# Patient Record
Sex: Female | Born: 1950 | Race: White | Hispanic: No | Marital: Married | Smoking: Never smoker
Health system: Southern US, Community
[De-identification: ages and names within clinical notes are randomized; demographics above are authoritative.]

## PROBLEM LIST (undated history)

## (undated) DIAGNOSIS — I10 Essential (primary) hypertension: Secondary | ICD-10-CM

## (undated) DIAGNOSIS — F419 Anxiety disorder, unspecified: Secondary | ICD-10-CM

## (undated) DIAGNOSIS — M797 Fibromyalgia: Secondary | ICD-10-CM

## (undated) DIAGNOSIS — E039 Hypothyroidism, unspecified: Secondary | ICD-10-CM

## (undated) DIAGNOSIS — J40 Bronchitis, not specified as acute or chronic: Secondary | ICD-10-CM

## (undated) DIAGNOSIS — J302 Other seasonal allergic rhinitis: Secondary | ICD-10-CM

## (undated) DIAGNOSIS — Z973 Presence of spectacles and contact lenses: Secondary | ICD-10-CM

## (undated) DIAGNOSIS — E079 Disorder of thyroid, unspecified: Secondary | ICD-10-CM

## (undated) DIAGNOSIS — M4800 Spinal stenosis, site unspecified: Secondary | ICD-10-CM

## (undated) HISTORY — PX: FRACTURE SURGERY: SHX138

## (undated) HISTORY — PX: LAPAROSCOPIC GASTRIC BAND REMOVAL WITH LAPAROSCOPIC GASTRIC SLEEVE RESECTION: SHX6498

## (undated) HISTORY — PX: ANTERIOR FUSION CERVICAL SPINE: SUR626

## (undated) HISTORY — PX: CHOLECYSTECTOMY: SHX55

---

## 2007-10-10 ENCOUNTER — Encounter: Payer: Self-pay | Admitting: Endocrinology

## 2007-10-10 ENCOUNTER — Ambulatory Visit: Payer: Self-pay | Admitting: Endocrinology

## 2007-10-10 DIAGNOSIS — F411 Generalized anxiety disorder: Secondary | ICD-10-CM | POA: Insufficient documentation

## 2007-10-10 DIAGNOSIS — J309 Allergic rhinitis, unspecified: Secondary | ICD-10-CM | POA: Insufficient documentation

## 2007-10-10 DIAGNOSIS — M1990 Unspecified osteoarthritis, unspecified site: Secondary | ICD-10-CM | POA: Insufficient documentation

## 2007-10-10 DIAGNOSIS — I10 Essential (primary) hypertension: Secondary | ICD-10-CM | POA: Insufficient documentation

## 2007-10-10 DIAGNOSIS — K219 Gastro-esophageal reflux disease without esophagitis: Secondary | ICD-10-CM | POA: Insufficient documentation

## 2016-04-10 ENCOUNTER — Ambulatory Visit: Payer: Self-pay | Admitting: Physician Assistant

## 2016-04-17 ENCOUNTER — Encounter (HOSPITAL_COMMUNITY)
Admission: RE | Admit: 2016-04-17 | Discharge: 2016-04-17 | Disposition: A | Discharge status: Home / Self Care | Payer: BC Managed Care – PPO | Source: Ambulatory Visit | Attending: Orthopedic Surgery | Admitting: Orthopedic Surgery

## 2016-04-17 ENCOUNTER — Encounter (HOSPITAL_COMMUNITY): Payer: Self-pay

## 2016-04-17 DIAGNOSIS — Z6834 Body mass index (BMI) 34.0-34.9, adult: Secondary | ICD-10-CM | POA: Diagnosis not present

## 2016-04-17 DIAGNOSIS — Z79899 Other long term (current) drug therapy: Secondary | ICD-10-CM | POA: Diagnosis not present

## 2016-04-17 DIAGNOSIS — M797 Fibromyalgia: Secondary | ICD-10-CM | POA: Diagnosis not present

## 2016-04-17 DIAGNOSIS — M4806 Spinal stenosis, lumbar region: Secondary | ICD-10-CM | POA: Diagnosis not present

## 2016-04-17 DIAGNOSIS — M5117 Intervertebral disc disorders with radiculopathy, lumbosacral region: Secondary | ICD-10-CM | POA: Diagnosis not present

## 2016-04-17 DIAGNOSIS — M549 Dorsalgia, unspecified: Secondary | ICD-10-CM | POA: Diagnosis present

## 2016-04-17 DIAGNOSIS — Z79891 Long term (current) use of opiate analgesic: Secondary | ICD-10-CM | POA: Diagnosis not present

## 2016-04-17 DIAGNOSIS — E669 Obesity, unspecified: Secondary | ICD-10-CM | POA: Diagnosis not present

## 2016-04-17 DIAGNOSIS — I10 Essential (primary) hypertension: Secondary | ICD-10-CM | POA: Diagnosis not present

## 2016-04-17 DIAGNOSIS — Z01812 Encounter for preprocedural laboratory examination: Secondary | ICD-10-CM | POA: Diagnosis not present

## 2016-04-17 DIAGNOSIS — Z01810 Encounter for preprocedural cardiovascular examination: Secondary | ICD-10-CM | POA: Diagnosis not present

## 2016-04-17 DIAGNOSIS — F419 Anxiety disorder, unspecified: Secondary | ICD-10-CM | POA: Diagnosis not present

## 2016-04-17 DIAGNOSIS — K219 Gastro-esophageal reflux disease without esophagitis: Secondary | ICD-10-CM | POA: Diagnosis not present

## 2016-04-17 DIAGNOSIS — E039 Hypothyroidism, unspecified: Secondary | ICD-10-CM | POA: Diagnosis not present

## 2016-04-17 HISTORY — DX: Bronchitis, not specified as acute or chronic: J40

## 2016-04-17 HISTORY — DX: Anxiety disorder, unspecified: F41.9

## 2016-04-17 HISTORY — DX: Disorder of thyroid, unspecified: E07.9

## 2016-04-17 HISTORY — DX: Presence of spectacles and contact lenses: Z97.3

## 2016-04-17 HISTORY — DX: Other seasonal allergic rhinitis: J30.2

## 2016-04-17 HISTORY — DX: Essential (primary) hypertension: I10

## 2016-04-17 HISTORY — DX: Fibromyalgia: M79.7

## 2016-04-17 HISTORY — DX: Hypothyroidism, unspecified: E03.9

## 2016-04-17 HISTORY — DX: Spinal stenosis, site unspecified: M48.00

## 2016-04-17 LAB — BASIC METABOLIC PANEL
ANION GAP: 7 (ref 5–15)
BUN: 11 mg/dL (ref 6–20)
CALCIUM: 9 mg/dL (ref 8.9–10.3)
CHLORIDE: 106 mmol/L (ref 101–111)
CO2: 27 mmol/L (ref 22–32)
CREATININE: 0.89 mg/dL (ref 0.44–1.00)
GFR calc non Af Amer: >60 mL/min (ref >60)
Glucose, Bld: 93 mg/dL (ref 65–99)
Potassium: 4 mmol/L (ref 3.5–5.1)
SODIUM: 140 mmol/L (ref 135–145)

## 2016-04-17 LAB — CBC
HCT: 38.4 % (ref 36.0–46.0)
HEMOGLOBIN: 12.6 g/dL (ref 12.0–15.0)
MCH: 29.4 pg (ref 26.0–34.0)
MCHC: 32.8 g/dL (ref 30.0–36.0)
MCV: 89.7 fL (ref 78.0–100.0)
PLATELETS: 321 10*3/uL (ref 150–400)
RBC: 4.28 MIL/uL (ref 3.87–5.11)
RDW: 13.9 % (ref 11.5–15.5)
WBC: 9.5 10*3/uL (ref 4.0–10.5)

## 2016-04-17 LAB — SURGICAL PCR SCREEN
MRSA, PCR: NEGATIVE
Staphylococcus aureus: NEGATIVE

## 2016-04-17 NOTE — Progress Notes (Addendum)
Anesthesia Chart Review: Patient is a 65 year old female scheduled for L5-S1 decompression discectomy on 04/19/16 by Dr. Rolena Infante.  History includes non-smoker, hypothyroidism, HTN (2010 in the setting of stress following her mother's death; currently no meds), fibromyalgia, anxiety, ACDF, cholecystectomy, laparoscopic gastric band removal with laparoscopic gastric sleeve resection. BMI is 34 consistent with obesity. PCP is Laverna Peace, NP.   Meds include Symbicort, Celexa, levothyroxine, Singulair, MSIR, Zanaflex, tramadol.  PAT Vitals: BP 119/75, HR 91, RR 20, T 36.9C, O2 sat 97%.  EKG 04/17/16: NSR. I think she does have a non-specific ST/T wave abnormality in inferior leads III and aVF. This is new/more pronounced when compared to 17-Feb-2013/ tracing from [facility]. She denied chest pain, SOB, and being under the care of a cardiologist. She denied prior stress, echo, or cath.   Preoperative labs noted.   She is asymptomatic from a CV standpoint. If no acute changes then I would anticipate that she could proceed as planned.  George Hugh [facility] Short Stay Center/Anesthesiology Phone [phone] 04/18/2016 9:57 AM

## 2016-04-17 NOTE — Progress Notes (Signed)
Pt denies SOB, chest pain, and being under the care of a cardiologist. Pt denies having a stress test, echo and cardiac cath. Pt denies having a chest x ray and EKG within the last year. Spoke with Ebony Hail, [place], Anesthesia, regarding pt past medical history of HTN in 2010 and Ebony Hail advised that I complete an EKG, CBC and BMP. Pt chart forwarded to anesthesia for pre-op consult as ordered.

## 2016-04-17 NOTE — Pre-Procedure Instructions (Signed)
Kristie [name]  04/17/2016      [facility] [address], [place] [address] [place] [address] Phone: [phone] Fax: [phone]    Your procedure is scheduled on Wednesday, Apr 19, 2016  Report to [facility] Admitting at 10:30 A.M.  Call this number if you have problems the morning of surgery:  [phone]   Remember:  Do not eat food or drink liquids after midnight Tuesday, Apr 18, 2016  Take these medicines the morning of surgery with A SIP OF WATER :citalopram (CELEXA),  levothyroxine (SYNTHROID, LEVOTHROID), montelukast (SINGULAIR), if needed: morphine (MSIR) OR  traMADol (ULTRAM) for pain, budesonide-formoterol (SYMBICORT) inhaler for wheezing ( Bring inhaler in with you on day of surgery). Stop taking Aspirin, vitamins, fish oil and herbal medications. Do not take any NSAIDs ie: Ibuprofen, Advil, Naproxen, BC and Goody Powder or any medication containing Aspirin; stop now.  Do not wear jewelry, make-up or nail polish.  Do not wear lotions, powders, or perfumes.  You may not  wear deodorant.  Do not shave 48 hours prior to surgery.    Do not bring valuables to the hospital.  [facility] is not responsible for any belongings or valuables.  Contacts, dentures or bridgework may not be worn into surgery.  Leave your suitcase in the car.  After surgery it may be brought to your room.  For patients admitted to the hospital, discharge time will be determined by your treatment team.  Patients discharged the day of surgery will not be allowed to drive home.   Name and phone number of your driver:  Special instructions: [place] - Preparing for Surgery  Before surgery, you can play an important role.  Because skin is not sterile, your skin needs to be as free of germs as possible.  You can reduce the number of germs on you skin by washing with CHG (chlorahexidine gluconate) soap before surgery.  CHG is an antiseptic cleaner which  kills germs and bonds with the skin to continue killing germs even after washing.  Please DO NOT use if you have an allergy to CHG or antibacterial soaps.  If your skin becomes reddened/irritated stop using the CHG and inform your nurse when you arrive at Short Stay.  Do not shave (including legs and underarms) for at least 48 hours prior to the first CHG shower.  You may shave your face.  Please follow these instructions carefully:   1.  Shower with CHG Soap the night before surgery and the morning of Surgery.  2.  If you choose to wash your hair, wash your hair first as usual with your normal shampoo.  3.  After you shampoo, rinse your hair and body thoroughly to remove the Shampoo.  4.  Use CHG as you would any other liquid soap.  You can apply chg directly  to the skin and wash gently with scrungie or a clean washcloth.  5.  Apply the CHG Soap to your body ONLY FROM THE NECK DOWN.  Do not use on open wounds or open sores.  Avoid contact with your eyes, ears, mouth and genitals (private parts).  Wash genitals (private parts) with your normal soap.  6.  Wash thoroughly, paying special attention to the area where your surgery will be performed.  7.  Thoroughly rinse your body with warm water from the neck down.  8.  DO NOT shower/wash with your normal soap after using and  rinsing off the CHG Soap.  9.  Pat yourself dry with a clean towel.            10.  Wear clean pajamas.            11.  Place clean sheets on your bed the night of your first shower and do not sleep with pets.  Day of Surgery  Do not apply any lotions/deodorants the morning of surgery.  Please wear clean clothes to the hospital/surgery center.  Please read over the following fact sheets that you were given. Pain Booklet, Coughing and Deep Breathing, MRSA Information and Surgical Site Infection Prevention

## 2016-04-19 ENCOUNTER — Encounter (HOSPITAL_COMMUNITY): Payer: Self-pay | Admitting: *Deleted

## 2016-04-19 ENCOUNTER — Ambulatory Visit (HOSPITAL_COMMUNITY): Payer: BC Managed Care – PPO | Admitting: Vascular Surgery

## 2016-04-19 ENCOUNTER — Observation Stay (HOSPITAL_COMMUNITY)
Admission: AD | Admit: 2016-04-19 | Discharge: 2016-04-20 | DRG: 520 | Disposition: A | Discharge status: Home Health | Payer: BC Managed Care – PPO | Source: Ambulatory Visit | Attending: Orthopedic Surgery | Admitting: Orthopedic Surgery

## 2016-04-19 ENCOUNTER — Ambulatory Visit (HOSPITAL_COMMUNITY): Payer: BC Managed Care – PPO

## 2016-04-19 ENCOUNTER — Encounter (HOSPITAL_COMMUNITY)
Admission: AD | Disposition: A | Discharge status: Home Health | Payer: Self-pay | Source: Ambulatory Visit | Attending: Orthopedic Surgery

## 2016-04-19 ENCOUNTER — Ambulatory Visit (HOSPITAL_COMMUNITY): Payer: BC Managed Care – PPO | Admitting: Anesthesiology

## 2016-04-19 DIAGNOSIS — E669 Obesity, unspecified: Secondary | ICD-10-CM | POA: Insufficient documentation

## 2016-04-19 DIAGNOSIS — E039 Hypothyroidism, unspecified: Secondary | ICD-10-CM | POA: Insufficient documentation

## 2016-04-19 DIAGNOSIS — Z01810 Encounter for preprocedural cardiovascular examination: Secondary | ICD-10-CM | POA: Insufficient documentation

## 2016-04-19 DIAGNOSIS — Z79899 Other long term (current) drug therapy: Secondary | ICD-10-CM | POA: Insufficient documentation

## 2016-04-19 DIAGNOSIS — I10 Essential (primary) hypertension: Secondary | ICD-10-CM | POA: Insufficient documentation

## 2016-04-19 DIAGNOSIS — M5117 Intervertebral disc disorders with radiculopathy, lumbosacral region: Secondary | ICD-10-CM | POA: Diagnosis not present

## 2016-04-19 DIAGNOSIS — Z01812 Encounter for preprocedural laboratory examination: Secondary | ICD-10-CM | POA: Insufficient documentation

## 2016-04-19 DIAGNOSIS — K219 Gastro-esophageal reflux disease without esophagitis: Secondary | ICD-10-CM | POA: Insufficient documentation

## 2016-04-19 DIAGNOSIS — F419 Anxiety disorder, unspecified: Secondary | ICD-10-CM | POA: Insufficient documentation

## 2016-04-19 DIAGNOSIS — M797 Fibromyalgia: Secondary | ICD-10-CM | POA: Insufficient documentation

## 2016-04-19 DIAGNOSIS — M4806 Spinal stenosis, lumbar region: Secondary | ICD-10-CM | POA: Insufficient documentation

## 2016-04-19 DIAGNOSIS — Z6834 Body mass index (BMI) 34.0-34.9, adult: Secondary | ICD-10-CM | POA: Insufficient documentation

## 2016-04-19 DIAGNOSIS — M549 Dorsalgia, unspecified: Secondary | ICD-10-CM | POA: Diagnosis present

## 2016-04-19 DIAGNOSIS — Z419 Encounter for procedure for purposes other than remedying health state, unspecified: Secondary | ICD-10-CM

## 2016-04-19 DIAGNOSIS — Z79891 Long term (current) use of opiate analgesic: Secondary | ICD-10-CM | POA: Insufficient documentation

## 2016-04-19 HISTORY — PX: LUMBAR LAMINECTOMY/DECOMPRESSION MICRODISCECTOMY: SHX5026

## 2016-04-19 IMAGING — CR DG LUMBAR SPINE 1V
1 series · 1 of 1 positions shown · non-contrast
Comparison: Prior film lateral view lumbar spine same day

CLINICAL DATA: L5-S1 decompression

EXAM:
LUMBAR SPINE - 1 VIEW

[lateral]
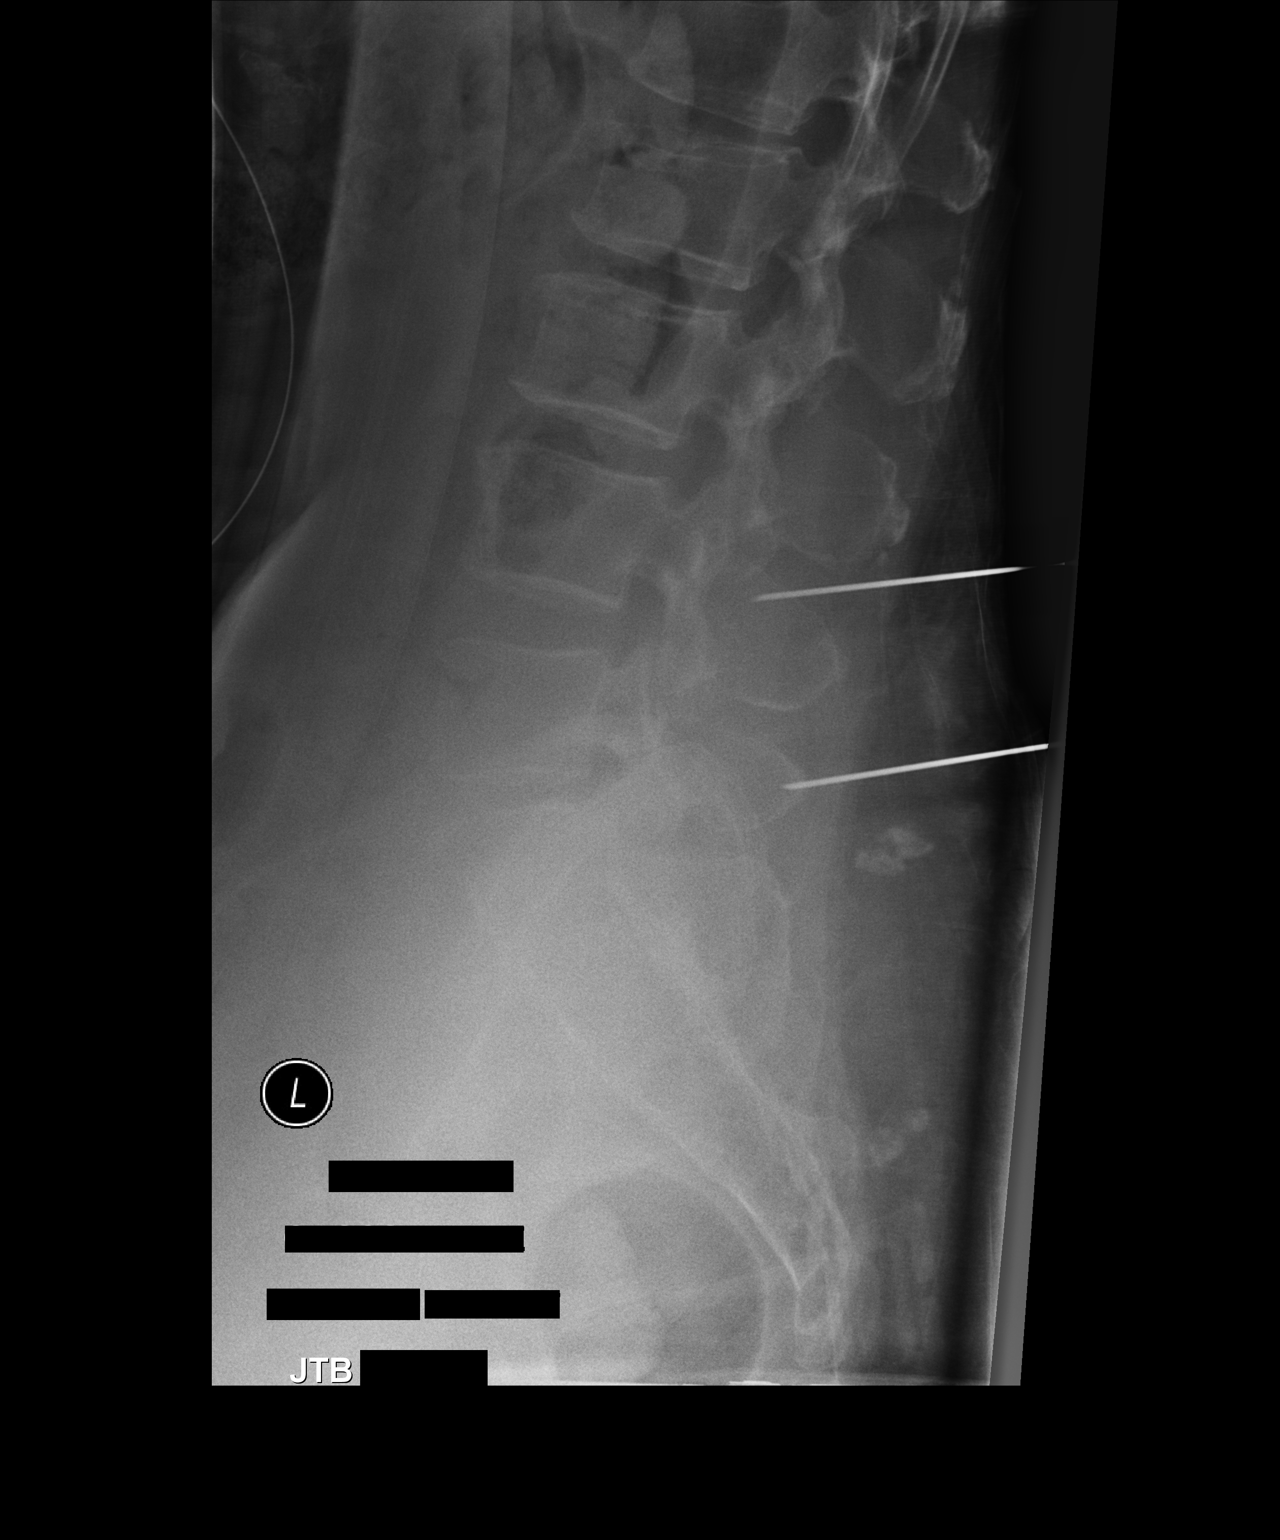

[1 of 1 positions shown; findings below may reference images not displayed]

FINDINGS: Single lateral view of the lumbar spine submitted. There is a
posterior localization needle at the level of L5-S1 disc space. The
tip of the needle is at the tip of L5 spinous process. A second
localization needle more superiorly at the level of lower endplate
of L4 mid aspect of spinous process of L4. There is disc space
flattening at L5-S1 level.
IMPRESSION: There is a posterior localization needle at the level of L5-S1
level. The tip of the needle is at the tip of L5 spinous process. A
second localization needle more superiorly at the level of lower
endplate of L4 mid aspect of spinous process of L4.

## 2016-04-19 SURGERY — LUMBAR LAMINECTOMY/DECOMPRESSION MICRODISCECTOMY
Anesthesia: General

## 2016-04-19 MED ORDER — METHOCARBAMOL 1000 MG/10ML IJ SOLN
500.0000 mg | Freq: Four times a day (QID) | INTRAVENOUS | Status: DC | PRN
  Filled 2016-04-19: qty 5

## 2016-04-19 MED ORDER — FENTANYL CITRATE (PF) 250 MCG/5ML IJ SOLN
INTRAMUSCULAR | Status: AC
  Filled 2016-04-19: qty 5

## 2016-04-19 MED ORDER — CEFAZOLIN SODIUM-DEXTROSE 2-4 GM/100ML-% IV SOLN
2.0000 g | INTRAVENOUS | Status: AC
  Administered 2016-04-19: 2 g via INTRAVENOUS
  Filled 2016-04-19: qty 100

## 2016-04-19 MED ORDER — 0.9 % SODIUM CHLORIDE (POUR BTL) OPTIME
TOPICAL | Status: DC | PRN
  Administered 2016-04-19: 1000 mL

## 2016-04-19 MED ORDER — TIZANIDINE HCL 4 MG PO CAPS
4.0000 mg | ORAL_CAPSULE | Freq: Every day | ORAL | Status: DC
  Administered 2016-04-19: 4 mg via ORAL
  Filled 2016-04-19: qty 1

## 2016-04-19 MED ORDER — LIDOCAINE HCL (CARDIAC) 20 MG/ML IV SOLN
INTRAVENOUS | Status: DC | PRN
  Administered 2016-04-19: 70 mg via INTRAVENOUS

## 2016-04-19 MED ORDER — ONDANSETRON HCL 4 MG/2ML IJ SOLN
INTRAMUSCULAR | Status: AC
  Filled 2016-04-19: qty 2

## 2016-04-19 MED ORDER — MEPERIDINE HCL 25 MG/ML IJ SOLN
6.2500 mg | INTRAMUSCULAR | Status: DC | PRN
Start: 2016-04-19 — End: 2016-04-19

## 2016-04-19 MED ORDER — MENTHOL 3 MG MT LOZG: 1.0000 | LOZENGE | OROMUCOSAL | Status: DC | PRN

## 2016-04-19 MED ORDER — HYDROMORPHONE HCL 1 MG/ML IJ SOLN
0.2500 mg | INTRAMUSCULAR | Status: DC | PRN
  Administered 2016-04-19 (×4): 0.5 mg via INTRAVENOUS

## 2016-04-19 MED ORDER — MIDAZOLAM HCL 2 MG/2ML IJ SOLN
INTRAMUSCULAR | Status: AC
  Filled 2016-04-19: qty 2

## 2016-04-19 MED ORDER — LEVOTHYROXINE SODIUM 125 MCG PO TABS
125.0000 ug | ORAL_TABLET | Freq: Every day | ORAL | Status: DC
  Administered 2016-04-20: 125 ug via ORAL
  Filled 2016-04-19: qty 1

## 2016-04-19 MED ORDER — PHENYLEPHRINE HCL 10 MG/ML IJ SOLN
10.0000 mg | INTRAVENOUS | Status: DC | PRN
  Administered 2016-04-19: 25 ug/min via INTRAVENOUS

## 2016-04-19 MED ORDER — SODIUM CHLORIDE 0.9% FLUSH: 3.0000 mL | Freq: Two times a day (BID) | INTRAVENOUS | Status: DC

## 2016-04-19 MED ORDER — PROPOFOL 10 MG/ML IV BOLUS
INTRAVENOUS | Status: DC | PRN
  Administered 2016-04-19: 150 mg via INTRAVENOUS
  Administered 2016-04-19: 40 mg via INTRAVENOUS

## 2016-04-19 MED ORDER — LACTATED RINGERS IV SOLN
INTRAVENOUS | Status: DC | PRN
  Administered 2016-04-19 (×2): via INTRAVENOUS

## 2016-04-19 MED ORDER — MIDAZOLAM HCL 5 MG/5ML IJ SOLN
INTRAMUSCULAR | Status: DC | PRN
  Administered 2016-04-19: 1 mg via INTRAVENOUS

## 2016-04-19 MED ORDER — PHENOL 1.4 % MT LIQD: 1.0000 | OROMUCOSAL | Status: DC | PRN

## 2016-04-19 MED ORDER — DEXAMETHASONE SODIUM PHOSPHATE 10 MG/ML IJ SOLN
INTRAMUSCULAR | Status: DC | PRN
  Administered 2016-04-19: 10 mg via INTRAVENOUS

## 2016-04-19 MED ORDER — PROPOFOL 10 MG/ML IV BOLUS
INTRAVENOUS | Status: AC
  Filled 2016-04-19: qty 20

## 2016-04-19 MED ORDER — MORPHINE SULFATE (PF) 2 MG/ML IV SOLN
1.0000 mg | INTRAVENOUS | Status: DC | PRN
  Administered 2016-04-20: 2 mg via INTRAVENOUS
  Filled 2016-04-19: qty 1

## 2016-04-19 MED ORDER — THROMBIN 20000 UNITS EX SOLR
CUTANEOUS | Status: DC | PRN
  Administered 2016-04-19: 20000 [IU] via TOPICAL

## 2016-04-19 MED ORDER — VECURONIUM BROMIDE 10 MG IV SOLR
INTRAVENOUS | Status: DC | PRN
Start: 2016-04-19 — End: 2016-04-19
  Administered 2016-04-19: 1 mg via INTRAVENOUS
  Administered 2016-04-19: 2 mg via INTRAVENOUS

## 2016-04-19 MED ORDER — LIDOCAINE 2% (20 MG/ML) 5 ML SYRINGE
INTRAMUSCULAR | Status: AC
  Filled 2016-04-19: qty 5

## 2016-04-19 MED ORDER — SODIUM CHLORIDE 0.9 % IV SOLN: 250.0000 mL | INTRAVENOUS | Status: DC

## 2016-04-19 MED ORDER — ROCURONIUM BROMIDE 100 MG/10ML IV SOLN
INTRAVENOUS | Status: DC | PRN
  Administered 2016-04-19: 10 mg via INTRAVENOUS
  Administered 2016-04-19: 40 mg via INTRAVENOUS

## 2016-04-19 MED ORDER — MONTELUKAST SODIUM 10 MG PO TABS
10.0000 mg | ORAL_TABLET | ORAL | Status: DC
  Administered 2016-04-20: 10 mg via ORAL
  Filled 2016-04-19: qty 1

## 2016-04-19 MED ORDER — HYDROMORPHONE HCL 1 MG/ML IJ SOLN
INTRAMUSCULAR | Status: AC
  Filled 2016-04-19: qty 1

## 2016-04-19 MED ORDER — LACTATED RINGERS IV SOLN: INTRAVENOUS | Status: DC

## 2016-04-19 MED ORDER — METHOCARBAMOL 500 MG PO TABS
500.0000 mg | ORAL_TABLET | Freq: Four times a day (QID) | ORAL | Status: DC | PRN
Start: 2016-04-19 — End: 2016-04-20
  Administered 2016-04-19 – 2016-04-20 (×2): 500 mg via ORAL
  Filled 2016-04-19 (×2): qty 1

## 2016-04-19 MED ORDER — MORPHINE SULFATE 15 MG PO TABS
15.0000 mg | ORAL_TABLET | Freq: Four times a day (QID) | ORAL | Status: DC | PRN
  Administered 2016-04-19 – 2016-04-20 (×2): 15 mg via ORAL
  Filled 2016-04-19 (×2): qty 1

## 2016-04-19 MED ORDER — CITALOPRAM HYDROBROMIDE 40 MG PO TABS
40.0000 mg | ORAL_TABLET | Freq: Every day | ORAL | Status: DC
  Administered 2016-04-20: 40 mg via ORAL
  Filled 2016-04-19: qty 1

## 2016-04-19 MED ORDER — SUGAMMADEX SODIUM 200 MG/2ML IV SOLN
INTRAVENOUS | Status: DC | PRN
  Administered 2016-04-19: 186.8 mg via INTRAVENOUS

## 2016-04-19 MED ORDER — SODIUM CHLORIDE 0.9% FLUSH: 3.0000 mL | INTRAVENOUS | Status: DC | PRN

## 2016-04-19 MED ORDER — FENTANYL CITRATE (PF) 100 MCG/2ML IJ SOLN
INTRAMUSCULAR | Status: DC | PRN
  Administered 2016-04-19: 100 ug via INTRAVENOUS
  Administered 2016-04-19 (×4): 50 ug via INTRAVENOUS
  Administered 2016-04-19: 100 ug via INTRAVENOUS
  Administered 2016-04-19: 50 ug via INTRAVENOUS

## 2016-04-19 MED ORDER — ONDANSETRON HCL 4 MG/2ML IJ SOLN
INTRAMUSCULAR | Status: DC | PRN
  Administered 2016-04-19: 4 mg via INTRAVENOUS

## 2016-04-19 MED ORDER — LACTATED RINGERS IV SOLN
Freq: Once | INTRAVENOUS | Status: AC
  Administered 2016-04-19: 11:00:00 via INTRAVENOUS

## 2016-04-19 MED ORDER — HYDROMORPHONE HCL 1 MG/ML IJ SOLN
INTRAMUSCULAR | Status: AC
Start: 2016-04-19 — End: 2016-04-20
  Filled 2016-04-19: qty 1

## 2016-04-19 MED ORDER — ONDANSETRON HCL 4 MG/2ML IJ SOLN
4.0000 mg | INTRAMUSCULAR | Status: DC | PRN
  Administered 2016-04-20: 4 mg via INTRAVENOUS
  Filled 2016-04-19: qty 2

## 2016-04-19 MED ORDER — BUPIVACAINE-EPINEPHRINE (PF) 0.25% -1:200000 IJ SOLN
INTRAMUSCULAR | Status: AC
  Filled 2016-04-19: qty 30

## 2016-04-19 MED ORDER — MOMETASONE FURO-FORMOTEROL FUM 200-5 MCG/ACT IN AERO
2.0000 | INHALATION_SPRAY | Freq: Two times a day (BID) | RESPIRATORY_TRACT | Status: DC
  Administered 2016-04-19 – 2016-04-20 (×2): 2 via RESPIRATORY_TRACT
  Filled 2016-04-19 (×2): qty 8.8

## 2016-04-19 MED ORDER — THROMBIN 20000 UNITS EX SOLR
CUTANEOUS | Status: AC
  Filled 2016-04-19: qty 20000

## 2016-04-19 MED ORDER — HEMOSTATIC AGENTS (NO CHARGE) OPTIME
TOPICAL | Status: DC | PRN
  Administered 2016-04-19 (×2): 1 via TOPICAL

## 2016-04-19 MED ORDER — ESMOLOL HCL 100 MG/10ML IV SOLN
INTRAVENOUS | Status: DC | PRN
  Administered 2016-04-19: 20 mg via INTRAVENOUS

## 2016-04-19 MED ORDER — CEFAZOLIN SODIUM 1-5 GM-% IV SOLN
1.0000 g | Freq: Three times a day (TID) | INTRAVENOUS | Status: AC
  Administered 2016-04-19 – 2016-04-20 (×2): 1 g via INTRAVENOUS
  Filled 2016-04-19 (×2): qty 50

## 2016-04-19 MED ORDER — BUPIVACAINE-EPINEPHRINE 0.25% -1:200000 IJ SOLN
INTRAMUSCULAR | Status: DC | PRN
  Administered 2016-04-19: 10 mL

## 2016-04-19 MED ORDER — PROCHLORPERAZINE EDISYLATE 5 MG/ML IJ SOLN: 10.0000 mg | INTRAMUSCULAR | Status: DC | PRN

## 2016-04-19 SURGICAL SUPPLY — 54 items
BNDG GAUZE ELAST 4 BULKY (GAUZE/BANDAGES/DRESSINGS) ×2 IMPLANT
CANISTER SUCTION 2500CC (MISCELLANEOUS) ×2 IMPLANT
CLSR STERI-STRIP ANTIMIC 1/2X4 (GAUZE/BANDAGES/DRESSINGS) ×2 IMPLANT
COVER SURGICAL LIGHT HANDLE (MISCELLANEOUS) ×2 IMPLANT
DRAPE SURG 17X23 STRL (DRAPES) ×6 IMPLANT
DRAPE U-SHAPE 47X51 STRL (DRAPES) ×2 IMPLANT
DRSG AQUACEL AG ADV 3.5X 6 (GAUZE/BANDAGES/DRESSINGS) ×2 IMPLANT
DRSG MEPILEX BORDER 4X8 (GAUZE/BANDAGES/DRESSINGS) IMPLANT
DURAPREP 26ML APPLICATOR (WOUND CARE) ×2 IMPLANT
ELECT BLADE 4.0 EZ CLEAN MEGAD (MISCELLANEOUS) ×2
ELECT PENCIL ROCKER SW 15FT (MISCELLANEOUS) ×2 IMPLANT
ELECT REM PT RETURN 9FT ADLT (ELECTROSURGICAL) ×2
ELECTRODE BLDE 4.0 EZ CLN MEGD (MISCELLANEOUS) ×1 IMPLANT
ELECTRODE REM PT RTRN 9FT ADLT (ELECTROSURGICAL) ×1 IMPLANT
GLOVE BIO SURGEON STRL SZ 6.5 (GLOVE) ×2 IMPLANT
GLOVE BIOGEL PI IND STRL 6.5 (GLOVE) ×1 IMPLANT
GLOVE BIOGEL PI IND STRL 8.5 (GLOVE) ×1 IMPLANT
GLOVE BIOGEL PI INDICATOR 6.5 (GLOVE) ×1
GLOVE BIOGEL PI INDICATOR 8.5 (GLOVE) ×1
GLOVE SS BIOGEL STRL SZ 8.5 (GLOVE) ×1 IMPLANT
GLOVE SUPERSENSE BIOGEL SZ 8.5 (GLOVE) ×1
GOWN STRL REUS W/ TWL XL LVL3 (GOWN DISPOSABLE) IMPLANT
GOWN STRL REUS W/TWL 2XL LVL3 (GOWN DISPOSABLE) ×2 IMPLANT
GOWN STRL REUS W/TWL XL LVL3 (GOWN DISPOSABLE)
KIT BASIN OR (CUSTOM PROCEDURE TRAY) ×2 IMPLANT
KIT ROOM TURNOVER OR (KITS) ×2 IMPLANT
NEEDLE 22X1 1/2 (OR ONLY) (NEEDLE) ×2 IMPLANT
NEEDLE SPNL 18GX3.5 QUINCKE PK (NEEDLE) ×4 IMPLANT
NS IRRIG 1000ML POUR BTL (IV SOLUTION) ×2 IMPLANT
PACK LAMINECTOMY ORTHO (CUSTOM PROCEDURE TRAY) ×2 IMPLANT
PACK UNIVERSAL I (CUSTOM PROCEDURE TRAY) ×2 IMPLANT
PAD ARMBOARD 7.5X6 YLW CONV (MISCELLANEOUS) ×6 IMPLANT
PATTIES SURGICAL .5 X.5 (GAUZE/BANDAGES/DRESSINGS) IMPLANT
PATTIES SURGICAL .5 X1 (DISPOSABLE) ×2 IMPLANT
SPONGE SURGIFOAM ABS GEL 100 (HEMOSTASIS) ×2 IMPLANT
SURGIFLO W/THROMBIN 8M KIT (HEMOSTASIS) ×2 IMPLANT
SUT BONE WAX W31G (SUTURE) ×2 IMPLANT
SUT MON AB 3-0 SH 27 (SUTURE)
SUT MON AB 3-0 SH27 (SUTURE) IMPLANT
SUT STRATAFIX 0 PDS 27 VIOLET (SUTURE) ×2
SUT STRATAFIX 1PDS 45CM VIOLET (SUTURE) ×2 IMPLANT
SUT STRATAFIX MNCRL+ 3-0 PS-2 (SUTURE) ×1
SUT STRATAFIX MONOCRYL 3-0 (SUTURE) ×1
SUT STRATAFIX SPIRAL + 2-0 (SUTURE) ×2 IMPLANT
SUT VIC AB 1 CT1 27 (SUTURE)
SUT VIC AB 1 CT1 27XBRD ANBCTR (SUTURE) IMPLANT
SUT VIC AB 2-0 CT1 18 (SUTURE) IMPLANT
SUTURE STRATFX 0 PDS 27 VIOLET (SUTURE) ×1 IMPLANT
SUTURE STRATFX MNCRL+ 3-0 PS-2 (SUTURE) ×1 IMPLANT
SYR CONTROL 10ML LL (SYRINGE) ×2 IMPLANT
TOWEL OR 17X24 6PK STRL BLUE (TOWEL DISPOSABLE) ×4 IMPLANT
TOWEL OR 17X26 10 PK STRL BLUE (TOWEL DISPOSABLE) ×2 IMPLANT
WATER STERILE IRR 1000ML POUR (IV SOLUTION) ×2 IMPLANT
YANKAUER SUCT BULB TIP NO VENT (SUCTIONS) ×2 IMPLANT

## 2016-04-19 NOTE — Brief Op Note (Signed)
04/19/2016  4:05 PM  PATIENT:  Kristie [name]  65 y.o. female  PRE-OPERATIVE DIAGNOSIS:  lumbar spinal stenosis L5 - S1  POST-OPERATIVE DIAGNOSIS:  lumbar spinal stenosis L5 - S1  PROCEDURE:  Procedure(s): LUMBAR DECOMPRESSION DISCECTOMY L5 - S1 (N/A)  SURGEON:  Surgeon(s) and Role:    * Melina Schools, MD - Primary  PHYSICIAN ASSISTANT:   ASSISTANTS: none   ANESTHESIA:   general  EBL:  Total I/O In: 1000 [I.V.:1000] Out: 300 [Blood:300]  BLOOD ADMINISTERED:none  DRAINS: none   LOCAL MEDICATIONS USED:  MARCAINE     SPECIMEN:  No Specimen  DISPOSITION OF SPECIMEN:  N/A  COUNTS:  YES  TOURNIQUET:  * No tourniquets in log *  DICTATION: .Other Dictation: Dictation Number [id number]  PLAN OF CARE: Admit for overnight observation  PATIENT DISPOSITION:  PACU - hemodynamically stable.

## 2016-04-19 NOTE — Transfer of Care (Signed)
Immediate Anesthesia Transfer of Care Note  Patient: Kristie [name]  Procedure(s) Performed: Procedure(s): LUMBAR DECOMPRESSION DISCECTOMY L5 - S1 (N/A)  Patient Location: PACU  Anesthesia Type:General  Level of Consciousness: awake, alert , oriented and patient cooperative  Airway & Oxygen Therapy: Patient Spontanous Breathing and Patient connected to nasal cannula oxygen  Post-op Assessment: Report given to RN, Post -op Vital signs reviewed and stable and Patient moving all extremities X 4  Post vital signs: Reviewed and stable  Last Vitals:  Filed Vitals:   04/19/16 1039  BP: 145/66  Pulse: 75  Temp: 36.6 C  Resp: 20    Last Pain:  Filed Vitals:   04/19/16 1103  PainSc: 8       Patients Stated Pain Goal: 2 (27/03/50 0938)  Complications: No apparent anesthesia complications

## 2016-04-19 NOTE — H&P (Signed)
History of Present Illness  The patient is a 65 year old Kristie [name] who comes in today for a preoperative History and Physical. The patient is scheduled for a L5-S1 decompression/discectomy to be performed by Dr. Duane Lope D. Rolena Infante, MD at [facility] on 04/19/2016 . Please see the hospital record for complete dictated history and physical.  Problem List/Past Medical  Problems Reconciled  Lumbar spinal stenosis (M48.07)  Degenerative lumbar disc (M51.36)  Lumbar radiculopathy (M54.16)   Allergies Penicillins  Allergies Reconciled   Family History  Congestive Heart Failure  father Heart Disease  mother, father and brother Heart disease in Kristie [name] family member before age 60  Hypertension  mother and father Rheumatoid Arthritis  mother  Social History  Tobacco use  never smoker Alcohol use  never consumed alcohol Current work status  working full time Drug/Alcohol Rehab (Currently)  no Drug/Alcohol Rehab (Previously)  no Exercise  Exercises weekly; does running / walking Illicit drug use  no Living situation  live with spouse Marital status  married Number of flights of stairs before winded  greater than 5 Pain Contract  no  Medication History Morphine Sulfate (15MG Tablet, 1 (one) Tablet Oral three times daily, as needed, Taken starting 04/07/2016) Active. Robaxin (500MG Tablet, 1 (one) Tablet Oral three times daily, as needed, Taken starting 04/07/2016) Active. Multi Vitamin Daily (Oral) Active. (qd) Vitamin D (1000UNIT Tablet, Oral) Active. (qd) Vitamin E (200UNIT Tablet, Oral) Active. (qd) Calcium (500MG Tablet, Oral) Active. (qd) Synthroid (125MCG Tablet, Oral) Active. (qd) Citalopram Hydrobromide (40MG Tablet, Oral) Active. (qd) TiZANidine HCl (4MG Tablet, Oral) Active. (qd "it is for my Fibromyalgia") Montelukast Sodium (10MG Tablet, Oral) Active. (qd) Medications Reconciled  Pregnancy / Birth History Pregnant  no  Past Surgical  History  Gallbladder Surgery  laporoscopic Neck Disc Surgery   Other Problems  Fibromyalgia  Hyperthyroidism  Unspecified Diagnosis   Vitals  04/17/2016 1:28 PM Weight: 199 lb Height: 65in Body Surface Area: 1.97 m Body Mass Index: 33.11 kg/m  Temp.: 98.34F(Oral)  Pulse: 90 (Regular)  BP: 155/110 (Sitting, Left Arm, Standard)  Physical Exam  General General Appearance-Not in acute distress. Orientation-Oriented X3. Build & Nutrition-Well nourished and Well developed.  Integumentary General Characteristics Surgical Scars - anterior neck surgical scarring consistent with previous cervical surgery, no surgical scar evidence of previous lumbar surgery. Lumbar Spine-Skin examination of the lumbar spine is without deformity, skin lesions, lacerations or abrasions.  Chest and Lung Exam Auscultation Breath sounds - Normal and Clear.  Cardiovascular Auscultation Rhythm - Regular rate and rhythm.  Abdomen Palpation/Percussion Palpation and Percussion of the abdomen reveal - Soft, Non Tender and No Rebound tenderness.  Peripheral Vascular Lower Extremity Palpation - Posterior tibial pulse - Bilateral - 2+. Dorsalis pedis pulse - Bilateral - 2+.  Neurologic Sensation Lower Extremity - Left - sensation is intact in the lower extremity. Right - sensation is diminished in the lower extremity. Reflexes Patellar Reflex - Bilateral - 2+. Achilles Reflex - Bilateral - 2+. Clonus - Bilateral - clonus not present. Hoffman's Sign - Bilateral - Hoffman's sign not present. Testing Seated Straight Leg Raise - Left - Seated straight leg raise negative. Right - Seated straight leg raise positive.  Musculoskeletal Spine/Ribs/Pelvis  Lumbosacral Spine: Inspection and Palpation - Tenderness - right lumbar paraspinals tender to palpation. Strength and Tone: Strength - Hip Flexion - Bilateral - 5/5. Knee Extension - Bilateral - 5/5. Knee Flexion - Bilateral - 5/5.  Ankle Dorsiflexion - Left - 5/5. Right - 1/5. Ankle Plantarflexion -  Bilateral - 5/5. Heel walk - Bilateral - able to heel walk with moderate difficulty. Toe Walk - Bilateral - able to walk on toes with moderate difficulty. Heel-Toe Walk - Bilateral - able to heel-toe walk without difficulty. ROM - Flexion - moderately decreased range of motion and painful. Extension - moderately decreased range of motion and painful. Left Lateral Bending - moderately decreased range of motion and painful. Right Lateral Bending - moderately decreased range of motion and painful. Right Rotation - moderately decreased range of motion and painful. Left Rotation - moderately decreased range of motion and painful. Pain - . Lumbosacral Spine - Waddell's Signs - no Waddell's signs present. Lower Extremity Range of Motion - No true hip, knee or ankle pain with range of motion. Gait and Station - [employer] - no assistive devices.    Assessment & Plan   Goal Of Surgery: Discussed that goal of surgery is to reduce pain and improve function and quality of life. Patient is aware that despite all appropriate treatment that there pain and function could be the same, worse, or different. Continued Morphine Sulfate 15MG, 1 (one) Tablet three times daily, as needed, #6, 2 days starting 04/17/2016, No Refill.  Posterior Lumbar Decompression/disectomy: Risks of surgery include infection, bleeding, nerve damage, death, stroke, paralysis, failure to heal, need for further surgery, ongoing or worse pain, need for further surgery, CSF leak, loss of bowel or bladder, and recurrent disc herniation or Stenosis which would necessitate need for further surgery.  Assessments Transcription Very pleasant 65 year old Kristie [name] patient with right-sided low back pain as well as radicular pain into her right lower extremity with some weakness in that right lower extremity as well. The patient has gone downstairs and gotten her brace that she  understands she needs to bring to the hospital. She has also already had her preop appointment. We will see the patient in a few days. Dr. Rolena Infante will be doing a right-sided hemilaminotomy, decompression that will alleviate the pain on the S1 nerve root.  MRI from 03/25/2016 shows multilevel spinal stenosis, most prominent with moderate-to-severe central stenosis at 3-4 and at 5-1. There is also a right-sided posterolateral disc extrusion causing compression to the S1 nerve root. At this point time while she does have stenosis at L3-4, her principal problem and the greatest source of her dysfunction and impairment has been the significant leg pain that she has.  To address this, I would recommend just doing an L5-S1 right-sided hemilaminotomy and decompression discectomy. This would alleviate the pain on the S1 nerve root and allow for restoration of function. We have talked about how her back pain may persist and she may still have some residual leg pain, but the severe night pain that she describes in the S1 distribution should improve.

## 2016-04-19 NOTE — Anesthesia Postprocedure Evaluation (Signed)
Anesthesia Post Note  Patient: Kristie [name]  Procedure(s) Performed: Procedure(s) (LRB): LUMBAR DECOMPRESSION DISCECTOMY L5 - S1 (N/A)  Patient location during evaluation: PACU Anesthesia Type: General Level of consciousness: awake and alert Pain management: pain level controlled Vital Signs Assessment: post-procedure vital signs reviewed and stable Respiratory status: spontaneous breathing, nonlabored ventilation, respiratory function stable and patient connected to nasal cannula oxygen Cardiovascular status: blood pressure returned to baseline and stable Postop Assessment: no signs of nausea or vomiting Anesthetic complications: no    Last Vitals:  Filed Vitals:   04/19/16 1730 04/19/16 1804  BP: 100/87 136/75  Pulse: 82 91  Temp: 36.7 C 37 C  Resp: 16 18    Last Pain:  Filed Vitals:   04/19/16 1805  PainSc: Asleep                 Effie Berkshire

## 2016-04-19 NOTE — Anesthesia Preprocedure Evaluation (Addendum)
Anesthesia Evaluation  Patient identified by MRN, date of birth, ID band Patient awake    Reviewed: Allergy & Precautions, NPO status , Patient's Chart, lab work & pertinent test results  Airway Mallampati: II  TM Distance: >3 FB Neck ROM: Full    Dental  (+) Teeth Intact   Pulmonary neg pulmonary ROS,    breath sounds clear to auscultation       Cardiovascular hypertension,  Rhythm:Regular Rate:Normal     Neuro/Psych PSYCHIATRIC DISORDERS Anxiety  Neuromuscular disease    GI/Hepatic Neg liver ROS, GERD  ,  Endo/Other  Hypothyroidism   Renal/GU negative Renal ROS  negative genitourinary   Musculoskeletal  (+) Arthritis , Osteoarthritis,  Fibromyalgia -  Abdominal   Peds negative pediatric ROS (+)  Hematology negative hematology ROS (+)   Anesthesia Other Findings   Reproductive/Obstetrics negative OB ROS                            04/2016 EKG: normal sinus rhythm.   Anesthesia Physical Anesthesia Plan  ASA: II  Anesthesia Plan: General   Post-op Pain Management:    Induction: Intravenous  Airway Management Planned: Oral ETT  Additional Equipment:   Intra-op Plan:   Post-operative Plan: Extubation in OR  Informed Consent: I have reviewed the patients History and Physical, chart, labs and discussed the procedure including the risks, benefits and alternatives for the proposed anesthesia with the patient or authorized representative who has indicated his/her understanding and acceptance.   Dental advisory given  Plan Discussed with: CRNA  Anesthesia Plan Comments:         Anesthesia Quick Evaluation

## 2016-04-19 NOTE — Anesthesia Procedure Notes (Signed)
Procedure Name: Intubation Date/Time: 04/19/2016 1:40 PM Performed by: Greggory Stallion, Iliany Losier L Pre-anesthesia Checklist: Patient identified, Emergency Drugs available, Suction available and Patient being monitored Patient Re-evaluated:Patient Re-evaluated prior to inductionOxygen Delivery Method: Circle System Utilized Preoxygenation: Pre-oxygenation with 100% oxygen Intubation Type: IV induction Ventilation: Mask ventilation without difficulty Laryngoscope Size: Mac and 3 Tube type: Oral Tube size: 7.5 mm Number of attempts: 1 Airway Equipment and Method: Stylet and Oral airway Placement Confirmation: ETT inserted through vocal cords under direct vision,  positive ETCO2 and breath sounds checked- equal and bilateral Secured at: 20 cm Tube secured with: Tape Dental Injury: Teeth and Oropharynx as per pre-operative assessment

## 2016-04-20 ENCOUNTER — Encounter (HOSPITAL_COMMUNITY): Payer: Self-pay | Admitting: Orthopedic Surgery

## 2016-04-20 DIAGNOSIS — M5117 Intervertebral disc disorders with radiculopathy, lumbosacral region: Secondary | ICD-10-CM | POA: Diagnosis not present

## 2016-04-20 MED ORDER — ACETAMINOPHEN 325 MG PO TABS
650.0000 mg | ORAL_TABLET | ORAL | Status: DC | PRN
  Administered 2016-04-20 (×2): 650 mg via ORAL
  Filled 2016-04-20 (×2): qty 2

## 2016-04-20 MED ORDER — TIZANIDINE HCL 4 MG PO CAPS: 4.0000 mg | ORAL_CAPSULE | Freq: Three times a day (TID) | ORAL | Status: DC

## 2016-04-20 MED ORDER — ONDANSETRON 4 MG PO TBDP: 4.0000 mg | ORAL_TABLET | Freq: Three times a day (TID) | ORAL | Status: DC | PRN

## 2016-04-20 MED ORDER — MORPHINE SULFATE 15 MG PO TABS
15.0000 mg | ORAL_TABLET | Freq: Four times a day (QID) | ORAL | Status: DC | PRN
Start: 2016-04-20 — End: 2019-05-29

## 2016-04-20 NOTE — Care Management Note (Signed)
Case Management Note  Patient Details  Name: Kristie [name] MRN: [id number] Date of Birth: 03-07-1951  Subjective/Objective:     65 yr old female s/p L5-S1 Lumbar decompression.               Action/Plan: Case manager spoke with patient concerning discharge plan and DME needs. Choice was offered for Home Health agency. Referral was called to Estrella Myrtle, [place] Specialist. DME has been delivered to patient's room. Patient will have family support at discharge.    Expected Discharge Date:   04/20/16        Expected Discharge Plan:  [place]  In-House Referral:  NA  Discharge planning Services  CM Consult  Post Acute Care Choice:  Durable Medical Equipment, Home Health Choice offered to:  Patient  DME Arranged:  3-N-1, [place] rolling DME Agency:  [place]:  PT, OT [facility] Agency:  [place]  Status of Service:     Medicare Important Message Given:    Date Medicare IM Given:    Medicare IM give by:    Date Additional Medicare IM Given:    Additional Medicare Important Message give by:     If discussed at [place] of Stay Meetings, dates discussed:    Additional Comments:  Ninfa Meeker, RN 04/20/2016, 11:45 AM

## 2016-04-20 NOTE — Evaluation (Signed)
Physical Therapy Evaluation Patient Details Name: Kristie [name] MRN: [id number] DOB: [birth year] Today's Date: 04/20/2016   History of Present Illness  Pt is a 65 y/o female who presents s/p L5-S1 lumbar laminectomy/decompression on 04/19/16.  Clinical Impression  Pt admitted with above diagnosis. Pt currently with functional limitations due to the deficits listed below (see PT Problem List). At the time of PT eval pt was able to perform transfers and ambulation with min guard assist for balance support and safety. Noted consistent R knee instability throughout session, even with RW use. Pt will benefit from skilled PT to increase their independence and safety with mobility to allow discharge to the venue listed below.       Follow Up Recommendations Home health PT;Supervision for mobility/OOB    Equipment Recommendations  None recommended by PT    Recommendations for Other Services       Precautions / Restrictions Precautions Precautions: Fall;Back Precaution Booklet Issued: Yes (comment) Precaution Comments: Reviewed handout and pt was cued for precautions during functional mobility.  Required Braces or Orthoses: Spinal Brace Spinal Brace: Lumbar corset;Applied in sitting position Restrictions Weight Bearing Restrictions: No      Mobility  Bed Mobility               General bed mobility comments: Pt sitting up in recliner chair upon PT arrival.   Transfers Overall transfer level: Needs assistance Equipment used: Rolling walker (2 wheeled) Transfers: Sit to/from Stand Sit to Stand: Min guard         General transfer comment: Close guard for safety as pt powered-up to full standing position. Appears unsteady but no assist required.   Ambulation/Gait Ambulation/Gait assistance: Min guard Ambulation Distance (Feet): 200 Feet Assistive device: Rolling walker (2 wheeled) Gait Pattern/deviations: Step-through pattern;Decreased stride length;Trunk flexed Gait velocity:  Decreased Gait velocity interpretation: Below normal speed for age/gender General Gait Details: VC's for improved posture, walker placement close to pt's body, and general safety. Slow and guarded with close guard required for safety.   Stairs Stairs: Yes Stairs assistance: Min assist Stair Management: One rail Right;Step to pattern;Forwards Number of Stairs: 4 General stair comments: HHA on the L for support with pt holding to R rail. VC''s for sequencing and safety with min assist for balance support.   Wheelchair Mobility    Modified Rankin (Stroke Patients Only)       Balance Overall balance assessment: Needs assistance Sitting-balance support: Feet supported;No upper extremity supported Sitting balance-Leahy Scale: Fair     Standing balance support: No upper extremity supported;During functional activity Standing balance-Leahy Scale: Poor Standing balance comment: Pt reaching for wall and furniture when mobilizing around room without RW.                              Pertinent Vitals/Pain Pain Assessment: Faces Pain Score: 7  Faces Pain Scale: Hurts little more Pain Location: Back Pain Descriptors / Indicators: Operative site guarding;Sore Pain Intervention(s): Limited activity within patient's tolerance;Monitored during session;Repositioned    Home Living Family/patient expects to be discharged to:: Private residence Living Arrangements: Spouse/significant other Available Help at Discharge: Family;Available 24 hours/day Type of Home: House Home Access: Stairs to enter Entrance Stairs-Rails: Right;Left;Can reach both Entrance Stairs-Number of Steps: 3 Home Layout: One level Home Equipment: Walker - 2 wheels;Bedside commode Additional Comments: Borrowing equipment from family    Prior Function Level of Independence: Independent         Comments: Used  rollator or SPC for community mobility     Hand Dominance   Dominant Hand: Right     Extremity/Trunk Assessment   Upper Extremity Assessment: Defer to OT evaluation           Lower Extremity Assessment: RLE deficits/detail RLE Deficits / Details: Decreased strength due to pre-op diagnosis.     Cervical / Trunk Assessment: Other exceptions  Communication   Communication: No difficulties  Cognition Arousal/Alertness: Awake/alert Behavior During Therapy: WFL for tasks assessed/performed Overall Cognitive Status: Within Functional Limits for tasks assessed                      General Comments      Exercises        Assessment/Plan    PT Assessment Patient needs continued PT services  PT Diagnosis Difficulty walking;Acute pain   PT Problem List Decreased strength;Decreased range of motion;Decreased activity tolerance;Decreased balance;Decreased mobility;Decreased knowledge of use of DME;Decreased safety awareness;Decreased knowledge of precautions;Pain  PT Treatment Interventions DME instruction;Gait training;Stair training;Functional mobility training;Therapeutic activities;Therapeutic exercise;Neuromuscular re-education;Patient/family education   PT Goals (Current goals can be found in the Care Plan section) Acute Rehab PT Goals Patient Stated Goal: Home at d/c PT Goal Formulation: With patient Time For Goal Achievement: 04/27/16 Potential to Achieve Goals: Good    Frequency Min 5X/week   Barriers to discharge        Co-evaluation               End of Session Equipment Utilized During Treatment: Gait belt Activity Tolerance: Patient tolerated treatment well Patient left: in chair;with call bell/phone within reach Nurse Communication: Mobility status         Time: 0825-0857 PT Time Calculation (min) (ACUTE ONLY): 32 min   Charges:   PT Evaluation $PT Eval Moderate Complexity: 1 Procedure PT Treatments $Gait Training: 8-22 mins   PT G Codes:        Rolinda Roan Apr 26, 2016, 9:22 AM   Rolinda Roan, PT, DPT Acute  Rehabilitation Services Pager: [phone]

## 2016-04-20 NOTE — Progress Notes (Signed)
Pt doing well. Pt and husband given D/C instructions with Rx's, verbal understanding was provided. Pt's incision is clean and dry with no sign of infection. Pt's IV was removed prior to D/C. Pt received RW prior to D/C. Pt D/C'd home via wheelchair @ 1210 per MD order. Pt is stable @ D/C and has no other needs at this time. Holli Humbles, RN

## 2016-04-20 NOTE — Progress Notes (Signed)
Occupational Therapy Evaluation Patient Details Name: Kristie [name] MRN: [id number] DOB: Oct 05, 1951 Today's Date: 04/20/2016    History of Present Illness Pt is a 65 y/o female who presents s/p L5-S1 lumbar laminectomy/decompression on 04/19/16.   Clinical Impression   PTA, pt was independent with ADLs and used rollator or SPC for community mobility. Pt currently requires min assist for ADLs and functional transfers due to pain and balance deficits. Began education on back precautions, brace wear protocol, and compensatory strategies for ADLs. Pt plans to d/c home with 24/7 assistance from her husband. Pt will benefit from continued acute OT to increase independence and safety with ADLs and mobility to allow for safe discharge home. Recommend HHOT and RW for home use.    Follow Up Recommendations  Home health OT;Supervision/Assistance - 24 hour    Equipment Recommendations  Other (comment) (RW-2 wheeled)    Recommendations for Other Services       Precautions / Restrictions Precautions Precautions: Fall;Back Precaution Booklet Issued: Yes (comment) Precaution Comments: Reviewed handout and pt was cued for precautions during functional mobility.  Required Braces or Orthoses: Spinal Brace Spinal Brace: Lumbar corset;Applied in sitting position Restrictions Weight Bearing Restrictions: No      Mobility Bed Mobility Overal bed mobility: Needs Assistance Bed Mobility: Rolling;Sidelying to Sit Rolling: Min assist Sidelying to sit: Min assist       General bed mobility comments: Pt sitting up in recliner chair upon PT arrival.   Transfers Overall transfer level: Needs assistance Equipment used: Rolling walker (2 wheeled) Transfers: Sit to/from Stand Sit to Stand: Min guard         General transfer comment: Close guard for safety as pt powered-up to full standing position. Appears unsteady but no assist required.     Balance Overall balance assessment: Needs  assistance Sitting-balance support: Feet supported;No upper extremity supported Sitting balance-Leahy Scale: Fair     Standing balance support: No upper extremity supported;During functional activity Standing balance-Leahy Scale: Poor Standing balance comment: Pt reaching for wall and furniture when mobilizing around room without RW.                             ADL Overall ADL's : Needs assistance/impaired     Grooming: Wash/dry hands;Cueing for compensatory techniques;Standing Grooming Details (indicate cue type and reason): educated on 2 cup method for oral care Upper Body Bathing: Set up;Sitting   Lower Body Bathing: Set up;With adaptive equipment;Cueing for compensatory techniques;Sit to/from stand;Cueing for back precautions Lower Body Bathing Details (indicate cue type and reason): advised to use long-handled sponge, cues to adhere to precautions as brace will be off for this task Upper Body Dressing : Set up;Sitting   Lower Body Dressing: Set up;Sit to/from stand;Cueing for back precautions;Cueing for compensatory techniques Lower Body Dressing Details (indicate cue type and reason): able to cross ankle-over-knee, husband can assist Toilet Transfer: Min guard;Cueing for safety;Ambulation;BSC Toilet Transfer Details (indicate cue type and reason): BSC over toilet, cues to feel BSC on back of legs before reaching back to sit to avoid twisting Toileting- Clothing Manipulation and Hygiene: Min guard;Sit to/from stand;Cueing for compensatory techniques Toileting - Clothing Manipulation Details (indicate cue type and reason): educated on use of wet wipes     Functional mobility during ADLs: Minimal assistance;Cueing for safety General ADL Comments: Educated on back precautions during functional daily tasks, compensatory strategies for LB ADLs, availability and use of AE, and proper use of DME. No family  present for OT eval.     Vision Vision Assessment?: No apparent  visual deficits   Perception     Praxis      Pertinent Vitals/Pain Pain Assessment: Faces Pain Score: 7  Faces Pain Scale: Hurts little more Pain Location: Back Pain Descriptors / Indicators: Operative site guarding;Sore Pain Intervention(s): Limited activity within patient's tolerance;Monitored during session;Repositioned     Hand Dominance Right   Extremity/Trunk Assessment Upper Extremity Assessment Upper Extremity Assessment: Defer to OT evaluation   Lower Extremity Assessment Lower Extremity Assessment: RLE deficits/detail RLE Deficits / Details: Decreased strength due to pre-op diagnosis.    Cervical / Trunk Assessment Cervical / Trunk Assessment: Other exceptions Cervical / Trunk Exceptions: Forward head posture and rounded shoulders.    Communication Communication Communication: No difficulties   Cognition Arousal/Alertness: Awake/alert Behavior During Therapy: WFL for tasks assessed/performed Overall Cognitive Status: Within Functional Limits for tasks assessed                     General Comments       Exercises       Shoulder Instructions      Home Living Family/patient expects to be discharged to:: Private residence Living Arrangements: Spouse/significant other Available Help at Discharge: Family;Available 24 hours/day Type of Home: House Home Access: Stairs to enter [employer] of Steps: 3 Entrance Stairs-Rails: Right;Left;Can reach both Home Layout: One level     Bathroom Shower/Tub: Teacher, early years/pre: Standard Bathroom Accessibility: Yes   Home Equipment: Environmental consultant - 2 wheels;Bedside commode Adaptive Equipment: Long-handled sponge Additional Comments: Borrowing equipment from family      Prior Functioning/Environment Level of Independence: Independent        Comments: Used rollator or SPC for community mobility    OT Diagnosis: Acute pain   OT Problem List: Decreased strength;Decreased range of  motion;Decreased activity tolerance;Impaired balance (sitting and/or standing);Decreased safety awareness;Decreased knowledge of use of DME or AE;Decreased knowledge of precautions;Pain   OT Treatment/Interventions: Self-care/ADL training;Therapeutic exercise;Energy conservation;DME and/or AE instruction;Therapeutic activities;Patient/family education;Balance training    OT Goals(Current goals can be found in the care plan section) Acute Rehab OT Goals Patient Stated Goal: Home at d/c OT Goal Formulation: With patient Time For Goal Achievement: 05/04/16 Potential to Achieve Goals: Good ADL Goals Pt Will Perform Lower Body Bathing: with modified independence;with adaptive equipment;sit to/from stand Pt Will Perform Lower Body Dressing: with modified independence;sit to/from stand Pt Will Transfer to Toilet: with modified independence;ambulating;bedside commode (over toilet) Pt Will Perform Toileting - Clothing Manipulation and hygiene: with modified independence;sit to/from stand Additional ADL Goal #1: Pt will don/doff back brace independently to increase independence with ADLs and mobility. Additional ADL Goal #2: Pt will demonstrate adherence to 3/3 back precautions to increase safety with ADLs and mobility.  OT Frequency: Min 2X/week   Barriers to D/C:            Co-evaluation              End of Session Equipment Utilized During Treatment: Gait belt;Rolling walker;Back brace Nurse Communication: Mobility status  Activity Tolerance: Patient tolerated treatment well Patient left: in chair;with call bell/phone within reach   Time: 0805-0828 OT Time Calculation (min): 23 min Charges:  OT General Charges $OT Visit: 1 Procedure OT Evaluation $OT Eval Moderate Complexity: 1 Procedure OT Treatments $Self Care/Home Management : 8-22 mins G-Codes:    Redmond Baseman, OTR/L Pager: [phone] 04/20/2016, 10:16 AM

## 2016-04-20 NOTE — Progress Notes (Signed)
Patient ID: Kristie [name], female   DOB: 05-15-1951, 65 y.o.   MRN: [id number]    Subjective: 1 Day Post-Op Procedure(s) (LRB): LUMBAR DECOMPRESSION DISCECTOMY L5 - S1 (N/A) Patient reports pain as 4 on 0-10 scale.  No leg pain just incisional pain Denies CP or SOB.  Voiding without difficulty. Positive flatus. Objective: Vital signs in last 24 hours: Temp:  [97.9 F (36.6 C)-100.7 F (38.2 C)] 100.7 F (38.2 C) (05/11 0400) Pulse Rate:  [64-101] 81 (05/11 0400) Resp:  [14-20] 20 (05/11 0400) BP: (95-157)/(63-91) 130/68 mmHg (05/11 0400) SpO2:  [97 %-100 %] 98 % (05/11 0400) Weight:  [93.441 kg (206 lb)] 93.441 kg (206 lb) (05/10 1039)  Intake/Output from previous day: 05/10 0701 - 05/11 0700 In: 1960 [P.O.:60; I.V.:1900] Out: 300 [Blood:300] Intake/Output this shift:    Labs:  Recent Labs  04/17/16 1238  HGB 12.6    Recent Labs  04/17/16 1238  WBC 9.5  RBC 4.28  HCT 38.4  PLT 321    Recent Labs  04/17/16 1238  NA 140  K 4.0  CL 106  CO2 27  BUN 11  CREATININE 0.89  GLUCOSE 93  CALCIUM 9.0   No results for input(s): LABPT, INR in the last 72 hours.  Physical Exam: Neurologically intact ABD soft Sensation intact distally Dorsiflexion/Plantar flexion intact Incision: no drainage Compartment soft  Assessment/Plan: 1 Day Post-Op Procedure(s) (LRB): LUMBAR DECOMPRESSION DISCECTOMY L5 - S1 (N/A) Advance diet Up with therapy  Pt may DC home after cleared by PT  Kristie [name], Darla Lesches for Dr. Melina Schools [facility] [phone] 04/20/2016, 7:17 AM

## 2016-04-20 NOTE — Op Note (Signed)
NAME:  LAKASHIA, COLLISON NO.:  [id number]  MEDICAL RECORD NO.:  [id number]  LOCATION:  3C09C                        FACILITY:  [place]  PHYSICIAN:  Kavir Savoca D. Rolena Infante, M.D. 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  POSTOPERATIVE DIAGNOSIS:  Lumbar spinal stenosis L5-S1 with right-sided S1 radiculopathy and disc herniation.  OPERATIVE PROCEDURE:  L5 right hemilaminotomy with lateral recess decompression and diskectomy, as well as S1 foraminotomy.  SURGEON:  Dameon Soltis D. Rolena Infante, M.D.  FIRST ASSISTANT:  None.  HISTORY:  This is a pleasant 65 year old woman who has been having progressive debilitating back, buttock, and neuropathic right leg pain in the S1 distribution.  Attempts at conservative management had failed to alleviate her symptoms and so we elected to proceed with surgery. Preoperative imaging studies confirmed spinal stenosis secondary to ligamentum flavum thickening as well as a hard disk osteophyte and a posterolateral right-sided recess.  All appropriate risks, benefits, and alternatives were discussed with the patient and consent was obtained.  OPERATIVE NOTE:  The patient was brought to the operating room, placed supine on the operating table.  After successful induction of general anesthesia and endotracheal intubation, TEDs and SCDs were applied.  She was turned prone onto a Wilson frame.  All bony prominences were well padded.  The back was prepped and draped in standard fashion.  A time- out was taken confirming the patient, procedure, and all other pertinent important data.  Once this was completed, 2 needles were placed in the back and an x-ray was taken for localization.  I then injected the incision site with 0.25% Marcaine and then made a midline  incision centered over the L5-S1 disc space.  Sharp dissection was carried out down to the deep fascia.  Deep fascia was sharply incised and using Bovie and Cobb elevator, I stripped the paraspinal muscles to expose the L5 and S1 lamina facet complex.  A Penfield 4 was placed under the L5 lamina, and an x-ray was taken to confirm the appropriate level.  Once this was done, a Immunologist was placed on the lateral side of the facet joint to provide for retraction.  Generous laminotomy of L5 was then performed using a 2 and 3 mm Kerrison punch.  I released the ligamentum flavum from the leading edge of the S1 lamina and then used a 2-mm Kerrison to perform a laminotomy of the superior portion of the S1 lamina.  I then released the ligamentum flavum and removed it with a 2 and 3-mm Kerrison punch.  I then continued my dissection into the lateral recess performing a medial facetectomy to remove the osteophyte. I palpated the S1 pedicle and then traced the S1 nerve root into the foramen.  Using a 2 and 3-mm Kerrison punch, I performed a foraminotomy until I had adequate decompression of the S1 nerve root.  I then went superiorly removing ligamentum flavum and the lamina until I could palpate into the L5 foramen.  There was significant still dorsal displacement  of the thecal sac secondary to the hard disk osteophyte. Using a Penfield 4, I gently manipulated the thecal sac over to expose the disc fragment.  An annulotomy was created with a 15 blade scalpel. Then, using a combination of pituitary rongeurs, nerve hooks, and curettes, I removed 2 large fragments of disc material.  Using Epstein reverse curettes, I was able to break apart the calcified medial portion of the disk herniation.  At this point, I could now take my [facility] and pass it circumferentially underneath the annulus and then between the annulus and the thecal sac and there was no significant ongoing central  compression.  I could freely mobilize the S1 nerve root into its foramen and it was adequately decompressed.  I could also palpate superiorly towards the L5 and it was also adequately decompressed.  At this point, I irrigated the wound copiously with normal saline.  I then obtained hemostasis using bipolar electrocautery and FloSeal.  Once that was done, I irrigated the wound copiously and then placed a thrombin-soaked Gelfoam over the exposed laminotomy site. I then closed the deep fascia with a running #1 Stratafix barbed suture and then a 2 layer with 0 running, 2-0 running, and then a 3-0 Monocryl. Steri-Strips and dry dressing were then applied.  The patient was ultimately extubated, transferred to PACU without incident.  At the end of the case, all needle and sponge counts were correct.  There were no adverse intraoperative events.     Lynanne Delgreco D. Rolena Infante, M.D.     DDB/MEDQ  D:  04/19/2016  T:  04/20/2016  Job:  [id number]  cc:   Dr. Rolena Infante' office

## 2016-05-03 NOTE — Discharge Summary (Signed)
Physician Discharge Summary  Patient ID: Chrishawna Farina MRN: [id number] DOB/AGE: 04/18/1951 65 y.o.  Admit date: 04/19/2016 Discharge date: 05/03/2016  Admission Diagnoses:  Lumbar DDD  Discharge Diagnoses:  Active Problems:   Back pain   Past Medical History  Diagnosis Date  . Wears glasses   . Spinal stenosis     L-5 to S-1  . Thyroid disease   . Hypothyroidism   . Hypertension     PMH: 2010  . Bronchitis   . Seasonal allergies   . Anxiety   . Fibromyalgia     Surgeries: Procedure(s): LUMBAR DECOMPRESSION DISCECTOMY L5 - S1 on 04/19/2016   Consultants (if any):    Discharged Condition: Improved  Hospital Course: Laren Orama is an 65 y.o. female who was admitted 04/19/2016 with a diagnosis of Lumbar DDD and went to the operating room on 04/19/2016 and underwent the above named procedures.  Pt was discharged on 04/20/16.   She was given perioperative antibiotics:  Anti-infectives    Start     Dose/Rate Route Frequency Ordered Stop   04/19/16 2200  ceFAZolin (ANCEF) IVPB 1 g/50 mL premix     1 g 100 mL/hr over 30 Minutes Intravenous Every 8 hours 04/19/16 1745 04/20/16 0435   04/19/16 0834  ceFAZolin (ANCEF) IVPB 2g/100 mL premix     2 g 200 mL/hr over 30 Minutes Intravenous 30 min pre-op 04/19/16 0834 04/19/16 1355    .  She was given sequential compression devices, early ambulation, and TED for DVT prophylaxis.  She benefited maximally from the hospital stay and there were no complications.    Recent vital signs:  Filed Vitals:   04/20/16 1018 04/20/16 1156  BP:  111/73  Pulse:  92  Temp: 100 F (37.8 C) 98.8 F (37.1 C)  Resp:  18    Recent laboratory studies:  Lab Results  Component Value Date   HGB 12.6 04/17/2016   Lab Results  Component Value Date   WBC 9.5 04/17/2016   PLT 321 04/17/2016   No results found for: INR Lab Results  Component Value Date   NA 140 04/17/2016   K 4.0 04/17/2016   CL 106 04/17/2016   CO2 27 04/17/2016   BUN 11  04/17/2016   CREATININE 0.89 04/17/2016   GLUCOSE 93 04/17/2016    Discharge Medications:     Medication List    STOP taking these medications        ibuprofen 200 MG tablet  Commonly known as:  ADVIL,MOTRIN     traMADol 50 MG tablet  Commonly known as:  ULTRAM      TAKE these medications        budesonide-formoterol 160-4.5 MCG/ACT inhaler  Commonly known as:  SYMBICORT  Inhale 2 puffs into the lungs 2 (two) times daily as needed (wheezing).     citalopram 40 MG tablet  Commonly known as:  CELEXA  Take 40 mg by mouth daily.     conjugated estrogens vaginal cream  Commonly known as:  PREMARIN  Place 1 Applicatorful vaginally every 3 (three) days.     levothyroxine 125 MCG tablet  Commonly known as:  SYNTHROID, LEVOTHROID  Take 125 mcg by mouth daily before breakfast.     montelukast 10 MG tablet  Commonly known as:  SINGULAIR  Take 10 mg by mouth every morning.     morphine 15 MG tablet  Commonly known as:  MSIR  Take 15 mg by mouth every 6 (six) hours as needed  for moderate pain or severe pain.     morphine 15 MG tablet  Commonly known as:  MSIR  Take 1 tablet (15 mg total) by mouth every 6 (six) hours as needed for moderate pain or severe pain.     ondansetron 4 MG disintegrating tablet  Commonly known as:  ZOFRAN ODT  Take 1 tablet (4 mg total) by mouth every 8 (eight) hours as needed for nausea or vomiting.     tiZANidine 4 MG capsule  Commonly known as:  ZANAFLEX  Take 4 mg by mouth at bedtime.     tiZANidine 4 MG capsule  Commonly known as:  ZANAFLEX  Take 1 capsule (4 mg total) by mouth 3 (three) times daily.        Diagnostic Studies: Dg Lumbar Spine 1 View  05/11/16  CLINICAL DATA:  L5-S1 decompression EXAM: LUMBAR SPINE - 1 VIEW COMPARISON:  Prior film lateral view lumbar spine same day FINDINGS: Single lateral view of the lumbar spine submitted. There is a posterior localization needle at the level of L5-S1 disc space. The tip of the needle  is at the tip of L5 spinous process. A second localization needle more superiorly at the level of lower endplate of L4 mid aspect of spinous process of L4. There is disc space flattening at L5-S1 level. IMPRESSION: There is a posterior localization needle at the level of L5-S1 level. The tip of the needle is at the tip of L5 spinous process. A second localization needle more superiorly at the level of lower endplate of L4 mid aspect of spinous process of L4. Electronically Signed   By: Lahoma Crocker M.D.   On: May 11, 2016 16:17   Dg Lumbar Spine 1 View  2016/05/11  CLINICAL DATA:  L5-S1 decompression EXAM: LUMBAR SPINE - 1 VIEW COMPARISON:  Portable cross-table lateral view at 1413 hours compared earlier study of 1401 hours FINDINGS: No prior exams for correlation of lumbar numbering system. Question 5 non-rib-bearing lumbar type vertebra on lateral view. Prior intraoperative exam 1401 hours does not have lumbar spine segmental labeled. Current exam is labeled with last open interspace as L5-S1 and the last independent vertebra as L5. Correlation of this numbering system with any prior outside imaging the patient may have is recommended to ensure consistency. Metallic probe via dorsal approach projects dorsal to with is here labeled the L5-S1 disc space. Vertebral body heights maintained. Minimal scattered anterior endplate spur formation at L2-L3 and L3-L4. Bones demineralized. IMPRESSION: Presumptive labeling of the lumbar spine with the last open interspace labeled L5-S1 ; this numbering system should be correlated with any prior outside imaging the patient may have to ensure consistency and to ensure that the correct surgical level is localized. Curved metallic probe via dorsal approach with tip projecting dorsal to what is labeled here the L5-S1 disc space. Electronically Signed   By: Lavonia Dana M.D.   On: May 11, 2016 14:30    Disposition: 01-Home or Self Care        Follow-up Information    Follow up  with Dahlia Bailiff, MD. Schedule an appointment as soon as possible for a visit in 2 weeks.   Specialty:  Orthopedic Surgery   Why:  If symptoms worsen, For suture removal, For wound re-check   Contact information:   [address] [place] [address] [phone]       Follow up with [facility].   Why:  Someone from [facility] will contact you to  arrange start date and time for therapy.   Contact information:   [address] [place] [address] [phone]        Signed: Valinda Hoar 05/03/2016, 8:10 AM

## 2017-05-16 NOTE — Progress Notes (Signed)
OT Note - Addendum    05-14-2017 0845  OT Visit Information  Last OT Received On May 14, 2017  OT G-codes **NOT FOR INPATIENT CLASS**  Functional Assessment Tool Used Clinical judgement  Functional Limitation Self care  Self Care Current Status (T4656) CI  Self Care Goal Status (C1275) CI  [facility], OT/L  [phone] May 14, 2017

## 2017-05-21 NOTE — Progress Notes (Signed)
Physical Therapy Evaluation Addendum for G-Codes    04/29/2016 0913  PT G-Codes **NOT FOR INPATIENT CLASS**  Functional Assessment Tool Used Clinical judgement  Functional Limitation Mobility: Walking and moving around  Mobility: Walking and Moving Around Current Status (Z9935) CJ  Mobility: Walking and Moving Around Goal Status (T0177) CI   Rolinda Roan, PT, DPT Acute Rehabilitation Services Pager: [phone]

## 2019-04-04 DIAGNOSIS — I249 Acute ischemic heart disease, unspecified: Secondary | ICD-10-CM

## 2019-04-04 DIAGNOSIS — R079 Chest pain, unspecified: Secondary | ICD-10-CM

## 2019-04-04 DIAGNOSIS — F419 Anxiety disorder, unspecified: Secondary | ICD-10-CM

## 2019-04-04 DIAGNOSIS — J45909 Unspecified asthma, uncomplicated: Secondary | ICD-10-CM

## 2019-04-04 DIAGNOSIS — M549 Dorsalgia, unspecified: Secondary | ICD-10-CM

## 2019-04-04 DIAGNOSIS — E039 Hypothyroidism, unspecified: Secondary | ICD-10-CM

## 2019-04-05 DIAGNOSIS — I77810 Thoracic aortic ectasia: Secondary | ICD-10-CM

## 2019-04-05 DIAGNOSIS — R0789 Other chest pain: Secondary | ICD-10-CM

## 2019-05-29 ENCOUNTER — Other Ambulatory Visit: Payer: Self-pay

## 2019-05-29 ENCOUNTER — Encounter: Payer: Self-pay | Admitting: Internal Medicine

## 2019-05-29 ENCOUNTER — Ambulatory Visit: Payer: Medicare Other | Admitting: Internal Medicine

## 2019-05-29 ENCOUNTER — Ambulatory Visit (INDEPENDENT_AMBULATORY_CARE_PROVIDER_SITE_OTHER): Payer: Medicare Other

## 2019-05-29 DIAGNOSIS — J8410 Pulmonary fibrosis, unspecified: Secondary | ICD-10-CM | POA: Insufficient documentation

## 2019-05-29 DIAGNOSIS — R05 Cough: Secondary | ICD-10-CM

## 2019-05-29 DIAGNOSIS — R0609 Other forms of dyspnea: Secondary | ICD-10-CM

## 2019-05-29 DIAGNOSIS — R053 Chronic cough: Secondary | ICD-10-CM

## 2019-05-29 LAB — CBC WITH DIFFERENTIAL/PLATELET
Basophils Absolute: 0.1 10*3/uL (ref 0.0–0.1)
Basophils Relative: 0.9 % (ref 0.0–3.0)
Eosinophils Absolute: 0.9 10*3/uL — ABNORMAL HIGH (ref 0.0–0.7)
Eosinophils Relative: 6.9 % — ABNORMAL HIGH (ref 0.0–5.0)
HCT: 37.5 % (ref 36.0–46.0)
Hemoglobin: 12.3 g/dL (ref 12.0–15.0)
Lymphocytes Relative: 21.7 % (ref 12.0–46.0)
Lymphs Abs: 2.9 10*3/uL (ref 0.7–4.0)
MCHC: 32.9 g/dL (ref 30.0–36.0)
MCV: 88.6 fl (ref 78.0–100.0)
Monocytes Absolute: 1 10*3/uL (ref 0.1–1.0)
Monocytes Relative: 7.9 % (ref 3.0–12.0)
Neutro Abs: 8.3 10*3/uL — ABNORMAL HIGH (ref 1.4–7.7)
Neutrophils Relative %: 62.6 % (ref 43.0–77.0)
Platelets: 534 10*3/uL — ABNORMAL HIGH (ref 150.0–400.0)
RBC: 4.24 Mil/uL (ref 3.87–5.11)
RDW: 14.3 % (ref 11.5–15.5)
WBC: 13.2 10*3/uL — ABNORMAL HIGH (ref 4.0–10.5)

## 2019-05-29 LAB — BASIC METABOLIC PANEL
BUN: 16 mg/dL (ref 6–23)
CO2: 27 mEq/L (ref 19–32)
Calcium: 8.9 mg/dL (ref 8.4–10.5)
Chloride: 104 mEq/L (ref 96–112)
Creatinine, Ser: 0.97 mg/dL (ref 0.40–1.20)
GFR: 57.32 mL/min — ABNORMAL LOW (ref >60.00)
Glucose, Bld: 89 mg/dL (ref 70–99)
Potassium: 3.9 mEq/L (ref 3.5–5.1)
Sodium: 138 mEq/L (ref 135–145)

## 2019-05-29 LAB — SEDIMENTATION RATE: Sed Rate: 25 mm/hr (ref 0–30)

## 2019-05-29 LAB — BRAIN NATRIURETIC PEPTIDE: Pro B Natriuretic peptide (BNP): 139 pg/mL — ABNORMAL HIGH (ref 0.0–100.0)

## 2019-05-29 MED ORDER — PANTOPRAZOLE SODIUM 40 MG PO TBEC: 40.0000 mg | DELAYED_RELEASE_TABLET | Freq: Every day | ORAL | 2 refills | Status: DC

## 2019-05-29 MED ORDER — FAMOTIDINE 20 MG PO TABS: ORAL_TABLET | ORAL | 11 refills | Status: DC

## 2019-05-29 NOTE — Progress Notes (Signed)
Spoke with pt and notified of results per Dr. Wert. Pt verbalized understanding and denied any questions. 

## 2019-05-29 NOTE — Assessment & Plan Note (Addendum)
Onset 2005 intermittent and evolved to chronic cough Jan 2020 on fosfamax -  D/c fosfamax and symb 160 05/29/2019 and max rx for reflux   Cough on insp is typical of uacs vs ILD.  Upper airway cough syndrome (previously labeled PNDS),  is so named because it's frequently impossible to sort out how much is  CR/sinusitis with freq throat clearing (which can be related to primary GERD)   vs  causing  secondary (" extra esophageal")  GERD from wide swings in gastric pressure that occur with throat clearing, often  promoting self use of mint and menthol lozenges that reduce the lower esophageal sphincter tone and exacerbate the problem further in a cyclical fashion.   These are the same pts (now being labeled as having "irritable larynx syndrome" by some cough centers) who not infrequently have a history of having failed to tolerate ace inhibitors,  dry powder inhalers (or even sometimes hfa ics, esp high doses like symb 160)  or biphosphonates or report having atypical/extraesophageal reflux symptoms that don't respond to standard doses of PPI  and are easily confused as having aecopd or asthma flares by even experienced allergists/ pulmonologists (myself included).   rec trial off fosfamax/ symbicort 160 and max rx for gerd then regroup in 4 weeks   Try

## 2019-05-29 NOTE — Progress Notes (Signed)
Kristie [name], female    DOB: 07-12-1951,   MRN: [id number]   Brief patient profile:  12 yowf never smoker "born with bronchitis" and on "02 x first 6 months" but by elementary school better and by HS 100% and IUP x 3 no trouble last 1979 then around 2005 noted variable sob assoc with cough esp in Nov requiring sev ov's per year abx and steroids and eventually maint inhaler symb 80 then 160 x Jan 2020 because not 100% better - in fact only 50% better since then with sense of congestion with min white mucus esp in am p stirring so referred to pulmonary clinic 05/29/2019 by Laverna Peace.   [facility] eval "around 2018" =  pos dust mites     History of Present Illness  05/29/2019  Pulmonary/ 1st office eval/Jamina Macbeth  Chief Complaint  Patient presents with  . Pulmonary Consult    Referred by Laverna Peace, NP. Pt c/o SOB "probably for years". She has cough in the mornings- prod with thick, white sputum "looks like glue"- and then starts back again when she lies down at night.   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   Sleeping as above without nocturnal  or early am exacerbation  of respiratory  c/o's or need for noct saba. Also denies any obvious fluctuation of symptoms with weather or environmental changes or other aggravating or alleviating factors except as outlined above   No unusual exposure hx or h/o childhood pna/ asthma or knowledge of premature birth.  Current Allergies, Complete Past Medical History, Past Surgical History, Family History, and Social History were reviewed in [employer]  record.  ROS  The following are not active complaints unless bolded Hoarseness, sore throat, dysphagia, dental problems, itching, sneezing,  nasal congestion or discharge of excess mucus or purulent secretions, ear ache,   fever, chills, sweats, unintended wt loss or wt gain, classically pleuritic or exertional cp,  orthopnea pnd or arm/hand swelling  or leg swelling, presyncope, palpitations, abdominal pain, anorexia, nausea, vomiting, diarrhea  or change in bowel habits or change in bladder habits, change in stools or change in urine, dysuria, hematuria,  rash, arthralgias, visual complaints, headache, numbness, weakness or ataxia or problems with walking or coordination,  change in mood or  memory.            Past Medical History:  Diagnosis Date  . Anxiety   . Bronchitis   . Fibromyalgia   . Hypertension    PMH: 2010  . Hypothyroidism   . Seasonal allergies   . Spinal stenosis    L-5 to S-1  . Thyroid disease   . Wears glasses     Outpatient Medications Prior to Visit  Medication Sig Dispense Refill  . albuterol (PROVENTIL) (2.5 MG/3ML) 0.083% nebulizer solution 1 vial in the neb every 4 hours as needed    . alendronate (FOSAMAX) 70 MG tablet Take 1 tablet by mouth every 7 (seven) days.    Marland Kitchen atorvastatin (LIPITOR) 80 MG tablet  Take 1 tablet by mouth daily.    . budesonide-formoterol (SYMBICORT) 160-4.5 MCG/ACT inhaler Inhale 2 puffs into the lungs 2 (two) times a day.     . citalopram (CELEXA) 40 MG tablet Take 40 mg by mouth daily.    . clopidogrel (PLAVIX) 75 MG tablet Take 75 mg by mouth daily.    Marland Kitchen levothyroxine (SYNTHROID, LEVOTHROID) 125 MCG tablet Take 125 mcg by mouth daily before breakfast.    . metoprolol succinate (TOPROL-XL) 25 MG 24 hr tablet Take 1 tablet by mouth daily.    . montelukast (SINGULAIR) 10 MG tablet Take 10 mg by mouth every morning.    Marland Kitchen morphine (MSIR) 15 MG tablet Take 15 mg by mouth every 6 (six) hours as needed for moderate pain or severe pain.     . nitroGLYCERIN (NITROSTAT) 0.4 MG SL tablet AS NEEDED    . ondansetron (ZOFRAN ODT) 4 MG disintegrating tablet Take 1 tablet (4 mg total) by mouth every 8 (eight) hours as needed for nausea or vomiting. 20 tablet 0  . tiZANidine (ZANAFLEX) 4 MG capsule Take 1 capsule (4 mg total) by mouth 3 (three) times daily. 30 capsule 0  . conjugated estrogens (PREMARIN) vaginal cream Place 1 Applicatorful vaginally every 3 (three) days.    Marland Kitchen morphine (MSIR) 15 MG tablet Take 1 tablet (15 mg total) by mouth every 6 (six) hours as needed for moderate pain or severe pain. 30 tablet 0  . tiZANidine (ZANAFLEX) 4 MG capsule Take 4 mg by mouth at bedtime.        Objective:     BP 104/60 (BP Location: Left Arm, Cuff Size: Normal)   Pulse 83   Temp 98.1 F (36.7 C) (Oral)   Ht _0  (1.651 m)   Wt 194 lb (88 kg)   SpO2 98%   BMI 32.28 kg/m   SpO2: 98 %  RA    Obese amb wf with cough on insp  HEENT: nl dentition, turbinates bilaterally, and oropharynx. Nl external ear canals without cough reflex   NECK :  without JVD/Nodes/TM/ nl carotid upstrokes bilaterally   LUNGS: no acc muscle use,  Nl contour chest with distant insp crackles bilaterally with  cough on insp  maneuvers   CV:  RRR  no s3 or murmur or increase in P2, and no edema   ABD:  soft and nontender with nl inspiratory excursion in the supine position. No bruits or organomegaly appreciated, bowel sounds nl  MS:  Nl gait/ ext warm without deformities, calf tenderness, cyanosis or clubbing No obvious joint restrictions   SKIN: warm and dry without lesions    NEURO:  alert, approp, nl sensorium with  no motor or cerebellar deficits apparent.    CXR PA and Lateral:   05/29/2019 :    I personally reviewed images and   impression as follows:   Mild non-specific increased markings   Labs ordered/ reviewed:   Collagen vascular and hsp profile     Chemistry      Component Value Date/Time   NA 138 05/29/2019 1118   K 3.9  05/29/2019 1118   CL 104 05/29/2019 1118   CO2 27 05/29/2019 1118   BUN 16 05/29/2019 1118   CREATININE 0.97 05/29/2019 1118      Component Value Date/Time   CALCIUM 8.9 05/29/2019 1118        Lab Results  Component Value Date   WBC 13.2 (H) 05/29/2019   HGB 12.3 05/29/2019  HCT 37.5 05/29/2019   MCV 88.6 05/29/2019   PLT 534.0 (H) 05/29/2019       EOS                                                              0.9                                     05/29/2019      Lab Results  Component Value Date   PROBNP 139.0 (H) 05/29/2019       Lab Results  Component Value Date   ESRSEDRATE 25 05/29/2019              Assessment   Chronic cough Onset 2005 intermittent and evolved to chronic cough Jan 2020 on fosfamax -  D/c fosfamax and symb 160 05/29/2019 and max rx for reflux   Cough on insp is typical of uacs vs ILD.  Upper airway cough syndrome (previously labeled PNDS),  is so named because it's frequently impossible to sort out how much is  CR/sinusitis with freq throat clearing (which can be related to primary GERD)   vs  causing  secondary (" extra esophageal")  GERD from wide swings in gastric pressure that occur with throat clearing, often  promoting self use of mint and menthol lozenges that reduce the lower esophageal sphincter tone and exacerbate the problem further in a cyclical fashion.   These are the same pts (now being labeled as having "irritable larynx syndrome" by some cough centers) who not infrequently have a history of having failed to tolerate ace inhibitors,  dry powder inhalers (or even sometimes hfa ics, esp high doses like symb 160)  or biphosphonates or report having atypical/extraesophageal reflux symptoms that don't respond to standard doses of PPI  and are easily confused as having aecopd or asthma flares by even experienced allergists/ pulmonologists (myself included).   rec trial off fosfamax/ symbicort 160 and max rx for gerd then regroup in 4  weeks      DOE (dyspnea on exertion) Onset ? Jan 2020  - 05/29/2019   Walked RA  2 laps @  approx 252f each @ fast pace  stopped due to end of study c/o sob and sats down to 92%   - collagen vasc profile sent 05/29/2019   No evidence of anemia, asthma/chf by today's eval   May have early ILD > will check hrct on return for f/u in 4 weeks       Total time devoted to counseling  > 50 % of initial 60 min office visit:  review case with pt/ directly observed portions of ambulatory 02 saturation study/  discussion of options/alternatives/ personally creating written customized instructions  in presence of pt  then going over those specific  Instructions directly with the pt including how to use all of the meds but in particular covering each new medication in detail and the difference between the maintenance= "automatic" meds and the prns using an action plan format for the latter (If this problem/symptom => do that organization reading Left to right).  Please see AVS from this visit for a full list of these instructions which I personally wrote for this  pt and  are unique to this visit.        Christinia Gully, MD 05/29/2019

## 2019-05-29 NOTE — Patient Instructions (Signed)
Pantoprazole (protonix) 40 mg   Take  30-60 min before first meal of the day and Pepcid (famotidine)  20 mg one @  bedtime until return to office - this is the best way to tell whether stomach acid is contributing to your problem.    GERD (REFLUX)  is an extremely common cause of respiratory symptoms just like yours , many times with no obvious heartburn at all.    It can be treated with medication, but also with lifestyle changes including elevation of the head of your bed (ideally with 6 -8inch blocks under the headboard of your bed),  Smoking cessation, avoidance of late meals, excessive alcohol, and avoid fatty foods, chocolate, peppermint, colas, red wine, and acidic juices such as orange juice.  NO MINT OR MENTHOL PRODUCTS SO NO COUGH DROPS  USE SUGARLESS CANDY INSTEAD (Jolley ranchers or Stover's or Life Savers) or even ice chips will also do - the key is to swallow to prevent all throat clearing. NO OIL BASED VITAMINS - use powdered substitutes.  Avoid fish oil when coughing.  Stop fosfamax for now and symbicort  And just use nebulizer as you needed     Please remember to go to the lab and x-ray department   for your tests - we will call you with the results when they are available.     Please schedule a follow up office visit in 4 weeks, sooner if needed

## 2019-05-29 NOTE — Assessment & Plan Note (Signed)
Onset ? Jan 2020  - 05/29/2019   Walked RA  2 laps @  approx 268f each @ fast pace  stopped due to end of study c/o sob and sats down to 92%   - collagen vasc profile sent 05/29/2019   No evidence of anemia, asthma/chf by today's eval   May have early ILD > will check hrct on return for f/u in 4 weeks    Total time devoted to counseling  > 50 % of initial 60 min office visit:  review case with pt/ directly observed portions of ambulatory 02 saturation study/  discussion of options/alternatives/ personally creating written customized instructions  in presence of pt  then going over those specific  Instructions directly with the pt including how to use all of the meds but in particular covering each new medication in detail and the difference between the maintenance= "automatic" meds and the prns using an action plan format for the latter (If this problem/symptom => do that organization reading Left to right).  Please see AVS from this visit for a full list of these instructions which I personally wrote for this pt and  are unique to this visit.

## 2019-06-02 NOTE — Progress Notes (Signed)
Spoke with pt and notified of results per Dr. Melvyn Novas. Pt verbalized understanding and denied any questions.

## 2019-06-04 LAB — RHEUMATOID FACTOR: Rhuematoid fact SerPl-aCnc: 19 IU/mL — ABNORMAL HIGH (ref <14)

## 2019-06-04 LAB — ANTI-NUCLEAR AB-TITER (ANA TITER)
ANA TITER: 1:1280 {titer} — ABNORMAL HIGH
ANA Titer 1: >=1:1280 {titer} — AB

## 2019-06-04 LAB — HYPERSENSITIVITY PNUEMONITIS PROFILE
ASPERGILLUS FUMIGATUS: NEGATIVE
Faenia retivirgula: NEGATIVE
Pigeon Serum: NEGATIVE
S. VIRIDIS: NEGATIVE
T. CANDIDUS: NEGATIVE
T. VULGARIS: NEGATIVE

## 2019-06-04 LAB — ANA: Anti Nuclear Antibody (ANA): POSITIVE — AB

## 2019-06-04 LAB — CYCLIC CITRUL PEPTIDE ANTIBODY, IGG: Cyclic Citrullin Peptide Ab: <16 UNITS

## 2019-07-02 ENCOUNTER — Ambulatory Visit (INDEPENDENT_AMBULATORY_CARE_PROVIDER_SITE_OTHER): Payer: Medicare Other | Admitting: Internal Medicine

## 2019-07-02 ENCOUNTER — Encounter: Payer: Self-pay | Admitting: Internal Medicine

## 2019-07-02 ENCOUNTER — Other Ambulatory Visit: Payer: Self-pay

## 2019-07-02 ENCOUNTER — Telehealth: Payer: Self-pay | Admitting: Internal Medicine

## 2019-07-02 DIAGNOSIS — R0609 Other forms of dyspnea: Secondary | ICD-10-CM | POA: Diagnosis not present

## 2019-07-02 DIAGNOSIS — R05 Cough: Secondary | ICD-10-CM

## 2019-07-02 DIAGNOSIS — R053 Chronic cough: Secondary | ICD-10-CM

## 2019-07-02 NOTE — Progress Notes (Signed)
Kristie [name], female    DOB: 1951-07-05,   MRN: [id number]   Brief patient profile:  83 yowf never smoker "born with bronchitis" and on "02 x first 6 months" but by elementary school better and by HS 100% and IUP x 3 no trouble last 1979 then around 2005 noted variable sob assoc with cough esp in Nov requiring sev ov's per year abx and steroids and eventually maint inhaler symb 80 then 160 x Jan 2020 because not 100% better - in fact only 50% better since then with sense of congestion with min white mucus esp in am p stirring so referred to pulmonary clinic 05/29/2019 by Laverna Peace.   Kozlow eval "around 2018" =  pos dust mites     History of Present Illness  05/29/2019  Pulmonary/ 1st office eval/Leith Hedlund  Chief Complaint  Patient presents with  . Pulmonary Consult    Referred by Laverna Peace, NP. Pt c/o SOB "probably for years". She has cough in the mornings- prod with thick, white sputum "looks like glue"- and then starts back again when she lies down at night.   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  GERD (REFLUX)   Stop fosfamax for now and symbicort  And just use nebulizer as you needed      07/02/2019  f/u ov/Kam Rahimi re:  ?PF  With pos RA and ANA speckled  Chief Complaint  Patient presents with  . Follow-up    Cough is some better. She has not used neb. No new co's.   Dyspnea:  At top levels at rehab needs 02 = 2lpm o/w reports as low as 78% Cough: better / sometimes worse at hs  Sleeping: fine once asleep bed blocks  3-4  In  SABA use: none   02: none x with exercise at levels   No obvious day to day or daytime variability or assoc excess/ purulent sputum or mucus plugs or hemoptysis or cp or chest tightness, subjective wheeze or overt sinus or hb symptoms.   Sleeping as above  without nocturnal  or early am exacerbation  of respiratory  c/o's or need for noct saba. Also denies any obvious fluctuation of symptoms with weather or environmental changes or other aggravating or alleviating factors except as outlined above   No unusual exposure hx or  knowledge of premature birth.  Current Allergies, Complete Past Medical History, Past Surgical History, Family History, and Social History were reviewed in [employer] record.  ROS  The following are not active complaints unless bolded Hoarseness, sore throat, dysphagia, dental problems, itching, sneezing,  nasal congestion or discharge of excess mucus or purulent secretions, ear ache,   fever, chills, sweats, unintended wt loss or wt gain, classically pleuritic or exertional cp,  orthopnea pnd or arm/hand swelling  or leg swelling, presyncope, palpitations, abdominal pain, anorexia, nausea, vomiting, diarrhea  or change in bowel habits or change in bladder habits,  change in stools or change in urine, dysuria, hematuria,  rash, arthralgias wrists hips knees , visual complaints, headache, numbness, weakness or ataxia or problems with walking or coordination,  change in mood or  memory.        Current Meds  Medication Sig  . albuterol (PROVENTIL) (2.5 MG/3ML) 0.083% nebulizer solution 1 vial in the neb every 4 hours as needed  . atorvastatin (LIPITOR) 80 MG tablet Take 1 tablet by mouth daily.  . citalopram (CELEXA) 40 MG tablet Take 40 mg by mouth daily.  . clopidogrel (PLAVIX) 75 MG tablet Take 75 mg by mouth daily.  . famotidine (PEPCID) 20 MG tablet One after supper  . levothyroxine (SYNTHROID, LEVOTHROID) 125 MCG tablet Take 125 mcg by mouth daily before  breakfast.  . metoprolol succinate (TOPROL-XL) 25 MG 24 hr tablet Take 1 tablet by mouth daily.  . montelukast (SINGULAIR) 10 MG tablet Take 10 mg by mouth every morning.  Marland Kitchen morphine (MSIR) 15 MG tablet Take 15 mg by mouth every 6 (six) hours as needed for moderate pain or severe pain.  . nitroGLYCERIN (NITROSTAT) 0.4 MG SL tablet AS NEEDED  . ondansetron (ZOFRAN ODT) 4 MG disintegrating tablet Take 1 tablet (4 mg total) by mouth every 8 (eight) hours as needed for nausea or vomiting.  . pantoprazole (PROTONIX) 40 MG tablet Take 1 tablet (40 mg total) by mouth daily. Take 30-60 min before first meal of the day  . tiZANidine (ZANAFLEX) 4 MG capsule Take 1 capsule (4 mg total) by mouth 3 (three) times daily.             Past Medical History:  Diagnosis Date  . Anxiety   . Bronchitis   . Fibromyalgia   . Hypertension    PMH: 2010  . Hypothyroidism   . Seasonal allergies   . Spinal stenosis    L-5 to S-1  . Thyroid disease   . Wears glasses       Objective:     Wt Readings from Last 3 Encounters:  07/02/19 192 lb (87.1 kg)  05/29/19 194 lb (88 kg)  04/19/16 206 lb (93.4 kg)     Vital signs reviewed - Note on arrival 02 sats  98% on RA        Obese wf nad     HEENT: nl dentition, turbinates bilaterally, and oropharynx. Nl external ear canals without cough reflex   NECK :  without JVD/Nodes/TM/ nl carotid upstrokes bilaterally   LUNGS: no acc muscle use,  Nl contour chest with insp crackles in bases  bilaterally without cough on insp or exp maneuvers   CV:  RRR  no s3 or murmur or increase in P2, and no edema   ABD:  soft and nontender with nl inspiratory excursion in the supine position. No bruits or organomegaly appreciated, bowel sounds nl  MS:  Nl gait/ ext warm without deformities, calf tenderness, cyanosis or clubbing No obvious joint restrictions   SKIN: warm and dry without lesions    NEURO:  alert, approp, nl sensorium with  no motor or cerebellar  deficits apparent.         I personally reviewed images and agree with radiology impression as follows:  CXR:   05/29/19   Chronic interstitial lung disease. My impression: Agree there is interstitial lung disease present unfortunately we do not have any comparison studies so it is hard to say how chronic this is      Assessment

## 2019-07-02 NOTE — Telephone Encounter (Signed)
Spoke with the pt and her spouse  They are asking what she is being referred to rheumatology for and what is causing the crackling in her lungs  The referral dx just says DOE  Please advise dx, thanks

## 2019-07-02 NOTE — Patient Instructions (Addendum)
We will call to schedule a high resolution ct of chest and rheumatology evaluation     Please schedule a follow up visit in 3 months but call sooner if needed with pfts on return

## 2019-07-03 ENCOUNTER — Encounter: Payer: Self-pay | Admitting: Internal Medicine

## 2019-07-03 NOTE — Telephone Encounter (Signed)
Call returned to patient, made aware of MW recommendations:  She has arthritis with abnormal markers in blood that could indication "rheumatism affecting the lung " so that is why she needs hrct and rheum consult  When rheumatism affects the lung it causes a form of PF with crackles   Voiced understanding. Made aware of CT appt. Nothing further is needed at this time.

## 2019-07-03 NOTE — Assessment & Plan Note (Signed)
Onset 2005 intermittent and evolved to chronic cough Jan 2020 on fosfamax -  D/c fosfamax and symb 160 05/29/2019 and max rx for reflux > resolved 07/02/2019   Adequate control on present rx, reviewed in detail with pt > no change in rx needed

## 2019-07-03 NOTE — Telephone Encounter (Signed)
She has arthritis with abnormal markers in blood that could indication "rheumatism affecting the lung " so that is why she needs hrct and rheum consult  When rheumatism affects the lung it causes a form of PF with crackles

## 2019-07-03 NOTE — Telephone Encounter (Signed)
LMTCB

## 2019-07-03 NOTE — Assessment & Plan Note (Signed)
Onset ? Jan 2020  - 05/29/2019   Walked RA  2 laps @  approx 279f each @ fast pace  stopped due to end of study c/o sob and sats down to 92%   - collagen vasc profile sent 05/29/2019 >  Pos for ANA with speckled pattern 1: 1280 and RA 19  - 07/02/2019   Walked RA  2 laps @  approx 2557feach @ avg pace  stopped due to  End of study with sats 91% at sob at end     DDx for pulmonary fibrosis  includes idiopathic pulmonary fibrosis, pulmonary fibrosis associated with rheumatologic diseases (which have a relatively benign course in most cases) , adverse effect from  drugs such as chemotherapy or amiodarone exposure, nonspecific interstitial pneumonia which is typically steroid responsive, and chronic hypersensitivity pneumonitis.   In active  smokers Langerhan's Cell  Histiocyctosis (eosinophilic granuomatosis),  DIP,  and Respiratory Bronchiolitis ILD also need to be considered, but I do not believe they apply here.  Note that her cough has improved with treatment for reflux.Use of PPI is associated with improved survival time and with decreased radiologic fibrosis per King's study published in AJRCCM vol 184 p1390.  Dec 2011 and also may have other beneficial effects as per the latest review in AJWhitinghamol 193 [id number].  This may not always be cause and effect, but given how universally underwhelming  (at least in terms of short term benefit)   and expensive  all the other  Drugs developed to day  have been for pf,   rec start  rx ppi / diet/ lifestyle modification and f/u with serial walking sats and lung volumes for now to put more points on the curve / establish firm baseline before considering additional measures.    >>> next step is high resolution CT scan of the chest and rheumatology evaluation.  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   I had an extended discussion with the patient  reviewing all relevant studies completed to  date and  lasting 15 to 20 minutes of a 25 minute visit  which included directly observing ambulatory 02 saturation study documented in a/p section of  today's  office note.  Each maintenance medication was reviewed in detail including most importantly the difference between maintenance and prns and under what circumstances the prns are to be triggered using an action plan format that is not reflected in the computer generated alphabetically organized AVS.     Please see AVS for specific instructions unique to this visit that I personally wrote and verbalized to the the pt in detail and then reviewed with pt  by my nurse highlighting any changes in therapy recommended at today's visit .

## 2019-07-03 NOTE — Telephone Encounter (Signed)
Pt is returning call CB# [phone]

## 2019-07-24 ENCOUNTER — Other Ambulatory Visit: Payer: Self-pay

## 2019-07-24 ENCOUNTER — Ambulatory Visit (INDEPENDENT_AMBULATORY_CARE_PROVIDER_SITE_OTHER)
Admission: RE | Admit: 2019-07-24 | Discharge: 2019-07-24 | Disposition: A | Discharge status: Home / Self Care | Payer: Medicare Other | Source: Ambulatory Visit | Attending: Internal Medicine | Admitting: Internal Medicine

## 2019-07-24 DIAGNOSIS — R0609 Other forms of dyspnea: Secondary | ICD-10-CM | POA: Diagnosis not present

## 2019-07-24 NOTE — Progress Notes (Signed)
LMTCB

## 2019-07-28 ENCOUNTER — Telehealth: Payer: Self-pay | Admitting: Internal Medicine

## 2019-07-28 NOTE — Telephone Encounter (Signed)
Notes recorded by Tanda Rockers, MD on 07/24/2019 at 4:37 PM EDT  Call patient : Study shows a stable pattern of scarring which does not need further w/u for now, just keep f/u appt and let me know in meantime if losing ground with activity tolerance    Called and spoke with pt letting her know the results of the CT and pt verbalized understanding. Nothing further needed.

## 2019-07-31 ENCOUNTER — Encounter: Payer: Self-pay | Admitting: Internal Medicine

## 2019-08-26 ENCOUNTER — Other Ambulatory Visit: Payer: Self-pay | Admitting: Internal Medicine

## 2019-08-26 MED ORDER — PANTOPRAZOLE SODIUM 40 MG PO TBEC: 40.0000 mg | DELAYED_RELEASE_TABLET | Freq: Every day | ORAL | 2 refills | Status: DC

## 2019-09-18 ENCOUNTER — Encounter: Payer: Self-pay | Admitting: Internal Medicine

## 2019-09-18 ENCOUNTER — Other Ambulatory Visit: Payer: Self-pay

## 2019-09-18 ENCOUNTER — Ambulatory Visit: Payer: Medicare Other | Admitting: Internal Medicine

## 2019-09-18 DIAGNOSIS — J8410 Pulmonary fibrosis, unspecified: Secondary | ICD-10-CM

## 2019-09-18 DIAGNOSIS — R0600 Dyspnea, unspecified: Secondary | ICD-10-CM | POA: Diagnosis not present

## 2019-09-18 DIAGNOSIS — R0609 Other forms of dyspnea: Secondary | ICD-10-CM

## 2019-09-18 MED ORDER — BUDESONIDE-FORMOTEROL FUMARATE 80-4.5 MCG/ACT IN AERO: INHALATION_SPRAY | RESPIRATORY_TRACT | 11 refills | Status: DC

## 2019-09-18 MED ORDER — BUDESONIDE-FORMOTEROL FUMARATE 80-4.5 MCG/ACT IN AERO: 2.0000 | INHALATION_SPRAY | Freq: Two times a day (BID) | RESPIRATORY_TRACT | 0 refills | Status: DC

## 2019-09-18 NOTE — Progress Notes (Signed)
Kristie [name], female    DOB: March 31, 1951,   MRN: [id number]   Brief patient profile:  45   yowf never smoker "born with bronchitis" and on "02 x first 6 months" but by elementary school better and by HS 100% and IUP x 3 no trouble last 1979 then around 2005 noted variable sob assoc with cough esp in Nov requiring sev ov's per year abx and steroids and eventually maint inhaler symb 80 then 160 x Jan 2020 because not 100% better - in fact only 50% better since then with sense of congestion with min white mucus esp in am p stirring so referred to pulmonary clinic 05/29/2019 by Laverna Peace.   Kozlow eval "around 2018" =  pos dust mites     History of Present Illness  05/29/2019  Pulmonary/ 1st office eval/Delontae Lamm  Chief Complaint  Patient presents with  . Pulmonary Consult    Referred by Laverna Peace, NP. Pt c/o SOB "probably for years". She has cough in the mornings- prod with thick, white sputum "looks like glue"- and then starts back again when she lies down at night.   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  GERD (REFLUX)   Stop fosfamax for now and symbicort  And just use nebulizer as you needed         07/02/2019  f/u ov/Milyn Stapleton re:  ?PF  With pos RA and ANA speckled  Chief Complaint  Patient presents with  . Follow-up    Cough is some better. She has not used neb. No new co's.   Dyspnea:  At top levels at rehab needs 02 = 2lpm o/w reports as low as 78% Cough: better / sometimes worse at hs  Sleeping: fine once asleep bed blocks  3-4  In  SABA  use: none  02: none x with exercise at levels rec We will call to schedule a high resolution ct of chest and rheumatology evaluation   Please schedule a follow up visit in 3 months but call sooner if needed with pfts on return      09/18/2019  f/u ov/Parminder Trapani re:  PF  Chief Complaint  Patient presents with  . Follow-up    Breathing worse today. She has been having anxiety attacks and this is making her breathing worse.  She is using her neb with albuterol neb 2 x daily.    Dyspnea:  More limited by back and breathing/ gets let off at curb /  Was using up to 4lpm with rehab but much lower level of activity now and not checking sats  Cough: better  Sleeping: bricks under head board plus 2 pillows  SABA use: some better  02: none    No obvious day to day or daytime variability or assoc excess/ purulent sputum or mucus plugs or hemoptysis or cp or chest tightness, subjective wheeze or overt sinus or hb symptoms.   Sleeping as above  without  nocturnal  or early am exacerbation  of respiratory  c/o's or need for noct saba. Also denies any obvious fluctuation of symptoms with weather or environmental changes or other aggravating or alleviating factors except as outlined above   No unusual exposure hx or h/o childhood pna/ asthma or knowledge of premature birth.  Current Allergies, Complete Past Medical History, Past Surgical History, Family History, and Social History were reviewed in [employer] record.  ROS  The following are not active complaints unless bolded Hoarseness, sore throat, dysphagia, dental problems, itching, sneezing,  nasal congestion or discharge of excess mucus or purulent secretions, ear ache,   fever, chills, sweats, unintended wt loss or wt gain, classically pleuritic or exertional cp,  orthopnea pnd or arm/hand swelling  or leg swelling, presyncope, palpitations, abdominal pain, anorexia, nausea, vomiting, diarrhea  or change in bowel habits or change  in bladder habits, change in stools or change in urine, dysuria, hematuria,  rash, arthralgias, visual complaints, headache, numbness, weakness or ataxia or problems with walking or coordination,  change in mood or  memory.        Current Meds  Medication Sig  . albuterol (PROVENTIL) (2.5 MG/3ML) 0.083% nebulizer solution 1 vial in the neb every 4 hours as needed  . atorvastatin (LIPITOR) 80 MG tablet Take 1 tablet by mouth daily.  . citalopram (CELEXA) 40 MG tablet Take 40 mg by mouth daily.  . clopidogrel (PLAVIX) 75 MG tablet Take 75 mg by mouth daily.  . famotidine (PEPCID) 20 MG tablet One after supper  . levothyroxine (SYNTHROID, LEVOTHROID) 125 MCG tablet Take 125 mcg by mouth daily before breakfast.  . metoprolol succinate (TOPROL-XL) 25 MG 24 hr tablet Take 1 tablet by mouth daily.  . montelukast (SINGULAIR) 10 MG tablet Take 10 mg by mouth every morning.  Marland Kitchen morphine (MSIR) 15 MG tablet Take 15 mg by mouth every 6 (six) hours as needed for moderate pain or severe pain.  . nitroGLYCERIN (NITROSTAT) 0.4 MG SL tablet AS NEEDED  . ondansetron (ZOFRAN ODT) 4 MG disintegrating tablet Take 1 tablet (4 mg total) by mouth every 8 (eight) hours as needed for nausea or vomiting.  . pantoprazole (PROTONIX) 40 MG tablet Take 1 tablet (40 mg total) by mouth daily. Take 30-60 min before first meal of the day  . tiZANidine (ZANAFLEX) 4 MG capsule Take 1 capsule (4 mg total) by mouth 3 (three) times daily.                Past Medical History:  Diagnosis Date  . Anxiety   . Bronchitis   . Fibromyalgia   . Hypertension    PMH: 2010  . Hypothyroidism   . Seasonal allergies   . Spinal stenosis    L-5 to S-1  . Thyroid disease   . Wears glasses       Objective:    amb obese wf nad raspy voice   09/18/2019        191  07/02/19 192 lb (87.1 kg)  05/29/19 194 lb (88 kg)  04/19/16 206 lb (93.4 kg)     Vital signs reviewed - Note on arrival 02 sats  95% on RA          HEENT : pt  wearing mask not removed for exam due to covid -19 concerns.    NECK :  without JVD/Nodes/TM/ nl carotid upstrokes bilaterally   LUNGS: no acc muscle use,  Nl contour chest with insp crackles in  bases  bilaterally without cough on insp or exp maneuvers   CV:  RRR  no s3 or murmur or increase in P2, and no edema   ABD:  soft and nontender with nl inspiratory excursion in the supine position. No bruits or organomegaly appreciated, bowel sounds nl  MS: very slow  gait/ ext warm without deformities, calf tenderness, cyanosis or clubbing No obvious joint restrictions   SKIN: warm and dry without lesions    NEURO:  alert, approp, nl sensorium with  no motor or cerebellar deficits apparent.           Assessment

## 2019-09-18 NOTE — Patient Instructions (Addendum)
Change symbicort to 80 Take 2 puffs first thing in am and then another 2 puffs about 12 hours later.   Work on inhaler technique:  relax and gently blow all the way out then take a nice smooth deep breath back in, triggering the inhaler at same time you start breathing in.  Hold for up to 5 seconds if you can. Blow out thru nose. Rinse and gargle with water when done   Only use your albuterol as a rescue medication to be used if you can't catch your breath by resting or doing a relaxed purse lip breathing pattern.  - The less you use it, the better it will work when you need it. - Ok to use up to  every 4 hours if you must but call for immediate appointment if use goes up over your usual need   Monitor your 02 saturations with activity.   I will call  Carma Leaven to see if she any results that change the plan we have here.   Next step is return here to Pulmonary fibrosis clinic

## 2019-09-21 ENCOUNTER — Encounter: Payer: Self-pay | Admitting: Internal Medicine

## 2019-09-21 DIAGNOSIS — R0609 Other forms of dyspnea: Secondary | ICD-10-CM | POA: Insufficient documentation

## 2019-09-21 NOTE — Assessment & Plan Note (Signed)
Onset around 2015 assoc with PF   09/18/2019   Walked RA x one lap =  approx 250 ft - stopped due to  Sob/back pain with sats 91% at end at very slow pace / freq stops  No indication for 02 - suspect she would desat if able to be more active but not the case at present   I had an extended discussion with the patient  And husband  reviewing all relevant studies completed to date and  lasting 15 to 20 minutes of a 25 minute visit  which included directly observing ambulatory 02 saturation study documented in a/p section of  today's  office note.  Each maintenance medication was reviewed in detail including most importantly the difference between maintenance and prns and under what circumstances the prns are to be triggered using an action plan format that is not reflected in the computer generated alphabetically organized AVS.     Please see AVS for specific instructions unique to this visit that I personally wrote and verbalized to the the pt in detail and then reviewed with pt  by my nurse highlighting any changes in therapy recommended at today's visit .

## 2019-09-21 NOTE — Assessment & Plan Note (Signed)
Onset ? Jan 2020  - 05/29/2019   Walked RA  2 laps @  approx 245f each @ fast pace  stopped due to end of study c/o sob and sats down to 92%   - collagen vasc profile sent 05/29/2019 >  Pos for ANA with speckled pattern 1: 1280 and RA 19  - 07/02/2019   Walked RA  2 laps @  approx 2575feach @ avg pace  stopped due to  End of study with sats 91% at sob at end   - HRCT 07/24/2019 1. The appearance of the lungs remains compatible with interstitial lung disease, with a spectrum of findings considered probable usual interstitial pneumonia (UIP) per current ATS guidelines. 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 - Rheum eval 07/16/19 by ErMarella ChimesA with multiple studies sent    At this point will refer to PF clinic/ req results for rheum w/u ? Needs any systemic rx and if not consider for OFEV?

## 2019-10-03 ENCOUNTER — Ambulatory Visit: Payer: Medicare Other | Admitting: Internal Medicine

## 2019-10-23 ENCOUNTER — Institutional Professional Consult (permissible substitution): Payer: Medicare Other | Admitting: Internal Medicine

## 2019-11-24 ENCOUNTER — Other Ambulatory Visit: Payer: Self-pay | Admitting: *Deleted

## 2019-11-24 NOTE — Progress Notes (Signed)
error 

## 2019-11-28 ENCOUNTER — Other Ambulatory Visit: Payer: Self-pay

## 2019-11-28 MED ORDER — PANTOPRAZOLE SODIUM 40 MG PO TBEC: 40.0000 mg | DELAYED_RELEASE_TABLET | Freq: Every day | ORAL | 2 refills | Status: DC

## 2019-12-31 ENCOUNTER — Telehealth: Payer: Self-pay | Admitting: Internal Medicine

## 2020-01-02 DIAGNOSIS — D6869 Other thrombophilia: Secondary | ICD-10-CM | POA: Diagnosis not present

## 2020-01-02 DIAGNOSIS — M961 Postlaminectomy syndrome, not elsewhere classified: Secondary | ICD-10-CM | POA: Diagnosis not present

## 2020-01-02 DIAGNOSIS — M5136 Other intervertebral disc degeneration, lumbar region: Secondary | ICD-10-CM | POA: Diagnosis not present

## 2020-01-02 DIAGNOSIS — Z79891 Long term (current) use of opiate analgesic: Secondary | ICD-10-CM | POA: Diagnosis not present

## 2020-01-02 DIAGNOSIS — I2510 Atherosclerotic heart disease of native coronary artery without angina pectoris: Secondary | ICD-10-CM | POA: Diagnosis not present

## 2020-01-06 DIAGNOSIS — B9689 Other specified bacterial agents as the cause of diseases classified elsewhere: Secondary | ICD-10-CM | POA: Diagnosis not present

## 2020-01-06 DIAGNOSIS — J019 Acute sinusitis, unspecified: Secondary | ICD-10-CM | POA: Diagnosis not present

## 2020-01-08 ENCOUNTER — Institutional Professional Consult (permissible substitution): Payer: Medicare Other | Admitting: Pulmonary Disease

## 2020-01-09 ENCOUNTER — Other Ambulatory Visit: Payer: Self-pay

## 2020-01-09 ENCOUNTER — Encounter: Payer: Self-pay | Admitting: Pulmonary Disease

## 2020-01-09 ENCOUNTER — Telehealth: Payer: Self-pay | Admitting: General Surgery

## 2020-01-09 ENCOUNTER — Ambulatory Visit: Payer: Medicare PPO | Admitting: Pulmonary Disease

## 2020-01-09 ENCOUNTER — Telehealth: Payer: Self-pay | Admitting: Internal Medicine

## 2020-01-09 DIAGNOSIS — J189 Pneumonia, unspecified organism: Secondary | ICD-10-CM

## 2020-01-09 DIAGNOSIS — R0600 Dyspnea, unspecified: Secondary | ICD-10-CM

## 2020-01-09 DIAGNOSIS — D72829 Elevated white blood cell count, unspecified: Secondary | ICD-10-CM

## 2020-01-09 DIAGNOSIS — J849 Interstitial pulmonary disease, unspecified: Secondary | ICD-10-CM

## 2020-01-09 DIAGNOSIS — R0609 Other forms of dyspnea: Secondary | ICD-10-CM

## 2020-01-09 LAB — CBC WITH DIFFERENTIAL/PLATELET
Basophils Absolute: 0.1 10*3/uL (ref 0.0–0.1)
Basophils Relative: 0.6 % (ref 0.0–3.0)
Eosinophils Absolute: 0.3 10*3/uL (ref 0.0–0.7)
Eosinophils Relative: 1.4 % (ref 0.0–5.0)
HCT: 35.6 % — ABNORMAL LOW (ref 36.0–46.0)
Hemoglobin: 11.7 g/dL — ABNORMAL LOW (ref 12.0–15.0)
Lymphocytes Relative: 28.7 % (ref 12.0–46.0)
Lymphs Abs: 5.8 10*3/uL — ABNORMAL HIGH (ref 0.7–4.0)
MCHC: 32.8 g/dL (ref 30.0–36.0)
MCV: 87.6 fl (ref 78.0–100.0)
Monocytes Absolute: 1.3 10*3/uL — ABNORMAL HIGH (ref 0.1–1.0)
Monocytes Relative: 6.6 % (ref 3.0–12.0)
Neutro Abs: 12.6 10*3/uL — ABNORMAL HIGH (ref 1.4–7.7)
Neutrophils Relative %: 62.7 % (ref 43.0–77.0)
Platelets: 493 10*3/uL — ABNORMAL HIGH (ref 150.0–400.0)
RBC: 4.06 Mil/uL (ref 3.87–5.11)
RDW: 14.8 % (ref 11.5–15.5)
WBC: 20.1 10*3/uL (ref 4.0–10.5)

## 2020-01-09 MED ORDER — PREDNISONE 20 MG PO TABS: 40.0000 mg | ORAL_TABLET | Freq: Every day | ORAL | 1 refills | Status: DC

## 2020-01-09 NOTE — Telephone Encounter (Signed)
Error.

## 2020-01-09 NOTE — Telephone Encounter (Signed)
I called the patient and advised her of the recommendations from Dr. Vaughan Browner. Patent lives in [place] and will not be able to get to our office location or [facility] for xray before closing.   Order for covid test entered (per conversation with app of the day, Derl Barrow). Patient stated she will have to make arrangements for someone to take care of her husband so she can have testing done tomorrow.   Patient given all 3 options for scheduling covid testing and advised she can use [facility] or [facility] for the xray (which she stated was closer to home for her). And if she runs into any problems to call the clinic number to be connected to the doctor on call. Patient voiced understanding, nothing further needed at this time.

## 2020-01-09 NOTE — Progress Notes (Signed)
Kristie [name]    [id number]    1951-12-02  Primary Care Physician:Moon, Amy A, NP  Referring Physician: Lowella Dandy, NP [place],  [place] [address]  Chief complaint: Consult for interstitial lung disease  HPI: 69 year old with history of unspecified autoimmune disease, interstitial lung disease.  Previously followed by Dr. Melvyn Novas Complains of chest tightness, dyspnea on exertion for the past 1 year.  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  She had been evaluated by Marella Chimes, PA at [facility] rheumatology.  I do not have these records to review.  She is currently not on any treatment for autoimmune/connective tissue disease.  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  History also notable for coronary artery disease with stent placement at [facility].  Echocardiogram at [facility] last year shows moderate pulmonary hypertension.  Pets: No pets Occupation: Retired Recruitment consultant Exposures: No known exposures.  No mold, hot tub, Jacuzzi.  No down pillows or comforter Smoking history: Never smoked Travel history: No significant travel history Relevant family history: No significant family history of lung  Outpatient Encounter Medications as of 01/09/2020  Medication Sig  . albuterol (PROVENTIL) (2.5 MG/3ML) 0.083% nebulizer solution 1 vial in the neb every 4 hours as needed  . atorvastatin (LIPITOR) 80 MG tablet Take 1 tablet by mouth daily.  . budesonide-formoterol (SYMBICORT) 80-4.5 MCG/ACT inhaler Take 2 puffs first thing in am and then another 2 puffs about 12 hours later.  . citalopram (CELEXA) 40 MG tablet Take 40 mg by mouth daily.  . clopidogrel (PLAVIX) 75 MG tablet Take 75 mg by mouth daily.  . famotidine (PEPCID) 20 MG tablet One after supper  . levothyroxine  (SYNTHROID, LEVOTHROID) 125 MCG tablet Take 125 mcg by mouth daily before breakfast.  . metoprolol succinate (TOPROL-XL) 25 MG 24 hr tablet Take 1 tablet by mouth daily.  . montelukast (SINGULAIR) 10 MG tablet Take 10 mg by mouth every morning.  Marland Kitchen morphine (MSIR) 15 MG tablet Take 15 mg by mouth every 6 (six) hours as needed for moderate pain or severe pain.  . nitroGLYCERIN (NITROSTAT) 0.4 MG SL tablet AS NEEDED  . ondansetron (ZOFRAN ODT) 4 MG disintegrating tablet Take 1 tablet (4 mg total) by mouth every 8 (eight) hours as needed for nausea or vomiting.  . pantoprazole (PROTONIX) 40 MG tablet Take 1 tablet (40 mg total) by mouth daily. Take 30-60 min before first meal of the day  . tiZANidine (ZANAFLEX) 4 MG capsule Take 1 capsule (4 mg total) by mouth 3 (three) times daily.   No facility-administered encounter medications on file as of 01/09/2020.    Allergies as of 01/09/2020 - Review Complete 01/09/2020  Allergen Reaction Noted  . Penicillins Itching and Other (See Comments)     Past Medical History:  Diagnosis Date  . Anxiety   . Bronchitis   . Fibromyalgia   . Hypertension    PMH: 2010  . Hypothyroidism   . Seasonal allergies   . Spinal stenosis    L-5 to S-1  . Thyroid disease   . Wears glasses     Past Surgical History:  Procedure Laterality Date  . ANTERIOR FUSION CERVICAL SPINE    . CHOLECYSTECTOMY    . FRACTURE SURGERY  right great toe  . LAPAROSCOPIC GASTRIC BAND REMOVAL WITH LAPAROSCOPIC GASTRIC SLEEVE RESECTION    . LUMBAR LAMINECTOMY/DECOMPRESSION MICRODISCECTOMY N/A 04/19/2016   Procedure: LUMBAR DECOMPRESSION DISCECTOMY L5 - S1;  Surgeon: Melina Schools, MD;  Location: [place];  Service: Orthopedics;  Laterality: N/A;    Family History  Problem Relation Age of Onset  . Diabetes Sister   . Heart disease Other   . Cancer Other     Social History   Socioeconomic History  . Marital status: Married    Spouse name: Not on file  . Number of children:  Not on file  . Years of education: Not on file  . Highest education level: Not on file  Occupational History  . Not on file  Tobacco Use  . Smoking status: Never Smoker  . Smokeless tobacco: Never Used  Substance and Sexual Activity  . Alcohol use: No  . Drug use: No  . Sexual activity: Not on file  Other Topics Concern  . Not on file  Social History Narrative  . Not on file   Social Determinants of Health   Financial Resource Strain:   . Difficulty of Paying Living Expenses: Not on file  Food Insecurity:   . Worried About Charity fundraiser in the Last Year: Not on file  . Ran Out of Food in the Last Year: Not on file  Transportation Needs:   . Lack of Transportation (Medical): Not on file  . Lack of Transportation (Non-Medical): Not on file  Physical Activity:   . Days of Exercise per Week: Not on file  . Minutes of Exercise per Session: Not on file  Stress:   . Feeling of Stress : Not on file  Social Connections:   . Frequency of Communication with Friends and Family: Not on file  . Frequency of Social Gatherings with Friends and Family: Not on file  . Attends Religious Services: Not on file  . Active Member of Clubs or Organizations: Not on file  . Attends Archivist Meetings: Not on file  . Marital Status: Not on file  Intimate Partner Violence:   . Fear of Current or Ex-Partner: Not on file  . Emotionally Abused: Not on file  . Physically Abused: Not on file  . Sexually Abused: Not on file    Review of systems: Review of Systems  Constitutional: Negative for fever and chills.  HENT: Negative.   Eyes: Negative for blurred vision.  Respiratory: as per HPI  Cardiovascular: Negative for chest pain and palpitations.  Gastrointestinal: Negative for vomiting, diarrhea, blood per rectum. Genitourinary: Negative for dysuria, urgency, frequency and hematuria.  Musculoskeletal: Negative for myalgias, back pain and joint pain.  Skin: Negative for itching  and rash.  Neurological: Negative for dizziness, tremors, focal weakness, seizures and loss of consciousness.  Endo/Heme/Allergies: Negative for environmental allergies.  Psychiatric/Behavioral: Negative for depression, suicidal ideas and hallucinations.  All other systems reviewed and are negative.  Physical Exam: Blood pressure 124/78, pulse 85, temperature 98 F (36.7 C), temperature source Temporal, height _0  (1.626 m), weight 185 lb (83.9 kg), SpO2 98 %. Gen:      No acute distress HEENT:  EOMI, sclera anicteric Neck:     No masses; no thyromegaly Lungs:    Clear to auscultation bilaterally; normal respiratory effort CV:         Regular rate and rhythm; no murmurs Abd:      + bowel sounds; soft, non-tender; no palpable masses, no  distension Ext:    No edema; adequate peripheral perfusion Skin:      Warm and dry; no rash Neuro: alert and oriented x 3 Psych: normal mood and affect  Data Reviewed: Imaging: CT high-resolution 07/24/2019-patchy groundglass with septal thickening, reticulation, bronchiectasis with no honeycombing.  Basal gradient noted.  Probable UIP pattern  PFTs:  Pending  Labs: CBC 05/29/2019-WBC 13.2, eos 6.9%, absolute eosinophil count 911  ANA 05/29/2019- > 1:1280  Echocardiogram ([facility]) 04/18/2019 Normal LV size, LVEF 38-25%, grade 1 diastolic dysfunction.  RVSP of 40-50% consistent with moderate pulmonary  Assessment:  Interstitial lung disease Reviewed CT and labs.  She likely has CTD related interstitial lung disease May have underlying asthma given elevated peripheral eosinophils We will repeat labs today including CTD profile, CBC, IgE Reevaluate with high-res CT, get pulmonary function test I will get records from rheumatology to see what the evaluation is.  Start prednisone 40 mg/day for now as she has worsening symptoms of dyspnea She will likely need to get started on alternate immunosuppressive such as CellCept or azathioprine after  discussion with her rheumatologist.  Pulmonary hypertension Related to cardiomyopathy, coronary artery disease, diastolic dysfunction versus pulmonary hypertension from connective tissue disease Repeat echocardiogram.  Consider right heart cath in her work-up in the future  Plan/Recommendations: - Prednisone 40 mg a day - Get rheumatology records - CTD labs, CBC, IgE - High-res CT, PFTs, echocardiogram, 6-minute walk  This appointment required 60 minutes of patient care (this includes precharting, chart review, review of results, face-to-face care, etc.).  Marshell Garfinkel MD [facility] 01/09/2020, 11:17 AM  CC: Lowella Dandy, NP

## 2020-01-09 NOTE — Telephone Encounter (Signed)
Spoke with the pt  She states that in the past Dr Melvyn Novas told her to d/c fosamax due to cough  Her cough never improved off of med  She is asking if she should start back on fosamax  Please advise thanks

## 2020-01-09 NOTE — Telephone Encounter (Signed)
Will hold off Fosamax for now and revaluate at return visit.  Can you let her know that the labs show elevated WBC count. I am concerned she may have an infection. Hold prednisone that we prescribed today till we figure this out. Please get a chest x ray to evaluate for pneumonia.  Marshell Garfinkel MD [facility] Please see [email or web address] for pager details.  01/09/2020, 4:26 PM

## 2020-01-09 NOTE — Telephone Encounter (Signed)
Spoke with Dr. Vaughan Browner, he wants patient tested for Covid.

## 2020-01-09 NOTE — Patient Instructions (Signed)
We will get some labs today including CTD profile, CBC differential, IgE Schedule you for 6-minute walk test, high-resolution CT, PFTs, echocardiogram We will get records from your rheumatologist Marella Chimes Started on prednisone 40 mg a day Follow-up in 2 to 4 weeks.

## 2020-01-09 NOTE — Telephone Encounter (Signed)
Sent to app of the day, Derl Barrow, NP on the behalf of Dr. Judy Pimple, this patient was seen by Dr. Vaughan Browner today and had several labs drawn. However, I received call report from [place] stating there was critical lab on this patient. Her CBC w/diff showed WBC of 20.10  Please advise. Thank you.

## 2020-01-09 NOTE — Telephone Encounter (Signed)
Addressed by Dr. Vaughan Browner already

## 2020-01-10 ENCOUNTER — Encounter (HOSPITAL_COMMUNITY): Payer: Self-pay | Admitting: Emergency Medicine

## 2020-01-10 ENCOUNTER — Emergency Department (HOSPITAL_COMMUNITY)
Admission: EM | Admit: 2020-01-10 | Discharge: 2020-01-10 | Disposition: A | Discharge status: Home / Self Care | Payer: Medicare PPO | Attending: Emergency Medicine | Admitting: Emergency Medicine

## 2020-01-10 ENCOUNTER — Emergency Department (HOSPITAL_COMMUNITY): Payer: Medicare PPO

## 2020-01-10 ENCOUNTER — Other Ambulatory Visit: Payer: Self-pay

## 2020-01-10 ENCOUNTER — Encounter (HOSPITAL_COMMUNITY): Payer: Self-pay

## 2020-01-10 DIAGNOSIS — Z7901 Long term (current) use of anticoagulants: Secondary | ICD-10-CM | POA: Diagnosis not present

## 2020-01-10 DIAGNOSIS — I10 Essential (primary) hypertension: Secondary | ICD-10-CM | POA: Diagnosis not present

## 2020-01-10 DIAGNOSIS — J8410 Pulmonary fibrosis, unspecified: Secondary | ICD-10-CM | POA: Diagnosis not present

## 2020-01-10 DIAGNOSIS — R05 Cough: Secondary | ICD-10-CM | POA: Insufficient documentation

## 2020-01-10 DIAGNOSIS — Z79899 Other long term (current) drug therapy: Secondary | ICD-10-CM | POA: Insufficient documentation

## 2020-01-10 DIAGNOSIS — Z20822 Contact with and (suspected) exposure to covid-19: Secondary | ICD-10-CM | POA: Diagnosis not present

## 2020-01-10 DIAGNOSIS — R0602 Shortness of breath: Secondary | ICD-10-CM | POA: Insufficient documentation

## 2020-01-10 DIAGNOSIS — E039 Hypothyroidism, unspecified: Secondary | ICD-10-CM | POA: Insufficient documentation

## 2020-01-10 LAB — CBC
HCT: 35.9 % — ABNORMAL LOW (ref 36.0–46.0)
Hemoglobin: 11.8 g/dL — ABNORMAL LOW (ref 12.0–15.0)
MCH: 29 pg (ref 26.0–34.0)
MCHC: 32.9 g/dL (ref 30.0–36.0)
MCV: 88.2 fL (ref 80.0–100.0)
Platelets: 463 10*3/uL — ABNORMAL HIGH (ref 150–400)
RBC: 4.07 MIL/uL (ref 3.87–5.11)
RDW: 14 % (ref 11.5–15.5)
WBC: 24.2 10*3/uL — ABNORMAL HIGH (ref 4.0–10.5)
nRBC: 0 % (ref 0.0–0.2)

## 2020-01-10 LAB — BASIC METABOLIC PANEL
Anion gap: 10 (ref 5–15)
BUN: 15 mg/dL (ref 8–23)
CO2: 23 mmol/L (ref 22–32)
Calcium: 8.8 mg/dL — ABNORMAL LOW (ref 8.9–10.3)
Chloride: 107 mmol/L (ref 98–111)
Creatinine, Ser: 0.8 mg/dL (ref 0.44–1.00)
GFR calc Af Amer: >60 mL/min (ref >60)
GFR calc non Af Amer: >60 mL/min (ref >60)
Glucose, Bld: 107 mg/dL — ABNORMAL HIGH (ref 70–99)
Potassium: 3 mmol/L — ABNORMAL LOW (ref 3.5–5.1)
Sodium: 140 mmol/L (ref 135–145)

## 2020-01-10 LAB — TROPONIN I (HIGH SENSITIVITY)
Troponin I (High Sensitivity): 6 ng/L (ref <18)
Troponin I (High Sensitivity): 6 ng/L (ref <18)

## 2020-01-10 MED ORDER — IOHEXOL 350 MG/ML SOLN
80.0000 mL | Freq: Once | INTRAVENOUS | Status: AC | PRN
  Administered 2020-01-10: 16:00:00 57 mL via INTRAVENOUS

## 2020-01-10 MED ORDER — SODIUM CHLORIDE 0.9% FLUSH: 3.0000 mL | Freq: Once | INTRAVENOUS | Status: DC

## 2020-01-10 MED ORDER — POTASSIUM CHLORIDE CRYS ER 20 MEQ PO TBCR
40.0000 meq | EXTENDED_RELEASE_TABLET | Freq: Once | ORAL | Status: AC
  Administered 2020-01-10: 15:00:00 40 meq via ORAL
  Filled 2020-01-10: qty 2

## 2020-01-10 MED ORDER — DOXYCYCLINE HYCLATE 100 MG PO CAPS: 100.0000 mg | ORAL_CAPSULE | Freq: Two times a day (BID) | ORAL | 0 refills | Status: DC

## 2020-01-10 NOTE — ED Triage Notes (Signed)
Pt states her PCP sent her to ED for chest x-ray because he thinks she has pneumonia.  C/o SOB and cough x 1 week that is worse since yesterday.  Had a breathing treatment this morning that helped a little.  Reports tightness across chest.

## 2020-01-10 NOTE — Discharge Instructions (Addendum)
Start taking your prednisone.  Follow-up with your doctor next week.  Stop taking the antibiotic he started Tuesday

## 2020-01-10 NOTE — ED Provider Notes (Signed)
[facility] [facility] EMERGENCY DEPARTMENT Provider Note   CSN: [id number] Arrival date & time: 01/10/20  1250     History Chief Complaint  Patient presents with  . Shortness of Breath  . Chest Pain    Kristie [name] is a 69 y.o. female.  Patient has pulmonary fibrosis and complains of shortness of breath.  She has had a cough.  She was sent over here to rule out pneumonia.  The history is provided by the patient. No language interpreter was used.  Shortness of Breath Severity:  Moderate Onset quality:  Gradual Timing:  Constant Progression:  Worsening Chronicity:  New Context: not activity   Relieved by:  Nothing Worsened by:  Nothing Ineffective treatments:  None tried Associated symptoms: chest pain   Associated symptoms: no abdominal pain, no cough, no headaches and no rash   Chest Pain Associated symptoms: shortness of breath   Associated symptoms: no abdominal pain, no back pain, no cough, no fatigue and no headache        Past Medical History:  Diagnosis Date  . Anxiety   . Bronchitis   . Fibromyalgia   . Hypertension    PMH: 2010  . Hypothyroidism   . Seasonal allergies   . Spinal stenosis    L-5 to S-1  . Thyroid disease   . Wears glasses     Patient Active Problem List   Diagnosis Date Noted  . DOE (dyspnea on exertion) 09/21/2019  . Chronic cough 05/29/2019  . 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 Back pain 04/19/2016  . ANXIETY 10/10/2007  . HYPERTENSION 10/10/2007  . ALLERGIC RHINITIS 10/10/2007  . GERD 10/10/2007  . OSTEOARTHRITIS 10/10/2007    Past Surgical History:  Procedure Laterality Date  . ANTERIOR FUSION CERVICAL SPINE    . CHOLECYSTECTOMY    . FRACTURE SURGERY     right great toe  . LAPAROSCOPIC GASTRIC BAND REMOVAL WITH LAPAROSCOPIC GASTRIC SLEEVE RESECTION    . LUMBAR LAMINECTOMY/DECOMPRESSION MICRODISCECTOMY N/A 04/19/2016   Procedure: LUMBAR DECOMPRESSION  DISCECTOMY L5 - S1;  Surgeon: Melina Schools, MD;  Location: [place];  Service: Orthopedics;  Laterality: N/A;     OB History   No obstetric history on file.     Family History  Problem Relation Age of Onset  . Diabetes Sister   . Heart disease Other   . Cancer Other     Social History   Tobacco Use  . Smoking status: Never Smoker  . Smokeless tobacco: Never Used  Substance Use Topics  . Alcohol use: No  . Drug use: No    Home Medications Prior to Admission medications   Medication Sig Start Date End Date Taking? Authorizing Provider  albuterol (PROVENTIL) (2.5 MG/3ML) 0.083% nebulizer solution 1 vial in the neb every 4 hours as needed 04/29/19   [provider]  atorvastatin (LIPITOR) 80 MG tablet Take 1 tablet by mouth daily. 04/19/19   [provider]  budesonide-formoterol (SYMBICORT) 80-4.5 MCG/ACT inhaler Take 2 puffs first thing in am and then another 2 puffs about 12 hours later. 09/18/19   Tanda Rockers, MD  citalopram (CELEXA) 40 MG tablet Take 40 mg by mouth daily.    [provider]  clopidogrel (PLAVIX) 75 MG tablet Take 75 mg by mouth daily.    [provider]  doxycycline (VIBRAMYCIN) 100 MG capsule Take 1 capsule (100 mg total) by mouth 2 (two) times daily. One  po bid x 7 days 01/10/20   Milton Ferguson, MD  famotidine (PEPCID) 20 MG tablet One after supper 05/29/19   Tanda Rockers, MD  levothyroxine (SYNTHROID, LEVOTHROID) 125 MCG tablet Take 125 mcg by mouth daily before breakfast.    [provider]  metoprolol succinate (TOPROL-XL) 25 MG 24 hr tablet Take 1 tablet by mouth daily. 05/14/19   [provider]  montelukast (SINGULAIR) 10 MG tablet Take 10 mg by mouth every morning.    [provider]  morphine (MSIR) 15 MG tablet Take 15 mg by mouth every 6 (six) hours as needed for moderate pain or severe pain.    [provider]  nitroGLYCERIN (NITROSTAT) 0.4 MG SL tablet AS NEEDED 04/19/19    [provider]  ondansetron (ZOFRAN ODT) 4 MG disintegrating tablet Take 1 tablet (4 mg total) by mouth every 8 (eight) hours as needed for nausea or vomiting. 04/20/16   Mayo, Darla Lesches, PA-C  pantoprazole (PROTONIX) 40 MG tablet Take 1 tablet (40 mg total) by mouth daily. Take 30-60 min before first meal of the day 11/28/19   Tanda Rockers, MD  predniSONE (DELTASONE) 20 MG tablet Take 2 tablets (40 mg total) by mouth daily with breakfast. 01/09/20   Mannam, Hart Robinsons, MD  tiZANidine (ZANAFLEX) 4 MG capsule Take 1 capsule (4 mg total) by mouth 3 (three) times daily. 04/20/16   Mayo, Darla Lesches, PA-C    Allergies    Penicillins  Review of Systems   Review of Systems  Constitutional: Negative for appetite change and fatigue.  HENT: Negative for congestion, ear discharge and sinus pressure.   Eyes: Negative for discharge.  Respiratory: Positive for shortness of breath. Negative for cough.   Cardiovascular: Positive for chest pain.  Gastrointestinal: Negative for abdominal pain and diarrhea.  Genitourinary: Negative for frequency and hematuria.  Musculoskeletal: Negative for back pain.  Skin: Negative for rash.  Neurological: Negative for seizures and headaches.  Psychiatric/Behavioral: Negative for hallucinations.    Physical Exam Updated Vital Signs BP 122/73   Pulse 66   Temp 97.9 F (36.6 C) (Oral)   Resp 16   SpO2 99%   Physical Exam Vitals and nursing note reviewed.  Constitutional:      Appearance: She is well-developed.  HENT:     Head: Normocephalic.     Nose: Nose normal.  Eyes:     General: No scleral icterus.    Conjunctiva/sclera: Conjunctivae normal.  Neck:     Thyroid: No thyromegaly.  Cardiovascular:     Rate and Rhythm: Normal rate and regular rhythm.     Heart sounds: No murmur. No friction rub. No gallop.   Pulmonary:     Breath sounds: No stridor. No wheezing or rales.  Chest:     Chest wall: No tenderness.  Abdominal:      General: There is no distension.     Tenderness: There is no abdominal tenderness. There is no rebound.  Musculoskeletal:        General: Normal range of motion.     Cervical back: Neck supple.  Lymphadenopathy:     Cervical: No cervical adenopathy.  Skin:    Findings: No erythema or rash.  Neurological:     Mental Status: She is alert and oriented to person, place, and time.     Motor: No abnormal muscle tone.     Coordination: Coordination normal.  Psychiatric:        Behavior: Behavior normal.  ED Results / Procedures / Treatments   Labs (all labs ordered are listed, but only abnormal results are displayed) Labs Reviewed  BASIC METABOLIC PANEL - Abnormal; Notable for the following components:      Result Value   Potassium 3.0 (*)    Glucose, Bld 107 (*)    Calcium 8.8 (*)    All other components within normal limits  CBC - Abnormal; Notable for the following components:   WBC 24.2 (*)    Hemoglobin 11.8 (*)    HCT 35.9 (*)    Platelets 463 (*)    All other components within normal limits  NOVEL CORONAVIRUS, NAA (HOSP ORDER, SEND-OUT TO REF LAB; TAT 18-24 HRS)  TROPONIN I (HIGH SENSITIVITY)  TROPONIN I (HIGH SENSITIVITY)    EKG None  Radiology CT Angio Chest PE W and/or Wo Contrast  Result Date: 01/10/2020 CLINICAL DATA:  Shortness of breath EXAM: CT ANGIOGRAPHY CHEST WITH CONTRAST TECHNIQUE: Multidetector CT imaging of the chest was performed using the standard protocol during bolus administration of intravenous contrast. Multiplanar CT image reconstructions and MIPs were obtained to evaluate the vascular anatomy. CONTRAST:  32m OMNIPAQUE IOHEXOL 350 MG/ML SOLN COMPARISON:  07/24/2019 FINDINGS: Cardiovascular: Pulmonary vasculature is well opacified. No filling defect to suggest pulmonary embolism. Main pulmonary trunk measures 3.1 cm in diameter. Mild cardiomegaly. No pericardial effusion. Atherosclerotic calcification of the aorta and coronary arteries. Thoracic  aorta is nonaneurysmal. Mediastinum/Nodes: Stable 9 mm precarinal lymph node. Mildly prominent bilateral hilar lymph nodes, grossly unchanged. No axillary lymphadenopathy. Thyroid, trachea, and esophagus within normal limits. Lungs/Pleura: Prominent respiratory motion slightly degrades evaluation of the lung bases. Similar appearance of basilar predominant areas of peripheral ground-glass attenuation with septal thickening and subpleural reticulation. Mild areas of patchy bibasilar consolidation, which are new from prior. No pleural effusion or pneumothorax. Upper Abdomen: No acute findings within the visualized upper abdomen. Postsurgical changes from gastric lap band and cholecystectomy noted. Musculoskeletal: No chest wall abnormality. No acute or significant osseous findings. Review of the MIP images confirms the above findings. IMPRESSION: 1. No evidence of acute pulmonary embolism. 2. Mild scattered areas of patchy bibasilar consolidation, which are new from prior study and may represent atelectasis or developing pneumonia. 3. Chronic findings of interstitial lung disease. Electronically Signed   By: NDavina PokeD.O.   On: 01/10/2020 16:00   DG Chest Portable 1 View  Result Date: 01/10/2020 CLINICAL DATA:  Cough and shortness of breath. EXAM: PORTABLE CHEST 1 VIEW COMPARISON:  May 29, 2019 FINDINGS: Cardiomediastinal silhouette is stable. No pneumothorax. Coarsened interstitial markings, particularly in the periphery of the left lung are stable. No new infiltrate. IMPRESSION: Chronic prominence of interstitial markings.  No acute infiltrate. Electronically Signed   By: DDorise BullionIII M.D   On: 01/10/2020 14:09    Procedures Procedures (including critical care time)  Medications Ordered in ED Medications  sodium chloride flush (NS) 0.9 % injection 3 mL (3 mLs Intravenous Not Given 01/10/20 1345)  potassium chloride SA (KLOR-CON) CR tablet 40 mEq (40 mEq Oral Given 01/10/20 1501)  iohexol  (OMNIPAQUE) 350 MG/ML injection 80 mL (57 mLs Intravenous Contrast Given 01/10/20 1545)    ED Course  I have reviewed the triage vital signs and the nursing notes.  Pertinent labs & imaging results that were available during my care of the patient were reviewed by me and considered in my medical decision making (see chart for details).    MDM Rules/Calculators/A&P  CT scan shows possible pneumonia.  Patient is placed on doxycycline she is nontoxic.  She will start back her prednisone and continue her albuterol neb treatments.  She will follow-up with her doctor next week and a Covid test is sent Final Clinical Impression(s) / ED Diagnoses Final diagnoses:  SOB (shortness of breath)    Rx / DC Orders ED Discharge Orders         Ordered    doxycycline (VIBRAMYCIN) 100 MG capsule  2 times daily     01/10/20 1621           Milton Ferguson, MD 01/10/20 1624

## 2020-01-10 NOTE — ED Notes (Signed)
Pt verbalized discharge instructions. Follow up care and prescriptions reviewed. Pt had no further questions.

## 2020-01-11 LAB — NOVEL CORONAVIRUS, NAA (HOSP ORDER, SEND-OUT TO REF LAB; TAT 18-24 HRS): SARS-CoV-2, NAA: NOT DETECTED

## 2020-01-12 ENCOUNTER — Other Ambulatory Visit: Payer: Medicare PPO

## 2020-01-12 DIAGNOSIS — J189 Pneumonia, unspecified organism: Secondary | ICD-10-CM | POA: Diagnosis not present

## 2020-01-12 DIAGNOSIS — J849 Interstitial pulmonary disease, unspecified: Secondary | ICD-10-CM | POA: Diagnosis not present

## 2020-01-13 DIAGNOSIS — Z136 Encounter for screening for cardiovascular disorders: Secondary | ICD-10-CM | POA: Diagnosis not present

## 2020-01-13 DIAGNOSIS — N959 Unspecified menopausal and perimenopausal disorder: Secondary | ICD-10-CM | POA: Diagnosis not present

## 2020-01-13 DIAGNOSIS — Z9181 History of falling: Secondary | ICD-10-CM | POA: Diagnosis not present

## 2020-01-13 DIAGNOSIS — Z1231 Encounter for screening mammogram for malignant neoplasm of breast: Secondary | ICD-10-CM | POA: Diagnosis not present

## 2020-01-13 DIAGNOSIS — Z139 Encounter for screening, unspecified: Secondary | ICD-10-CM | POA: Diagnosis not present

## 2020-01-13 DIAGNOSIS — Z0000 Encounter for general adult medical examination without abnormal findings: Secondary | ICD-10-CM | POA: Diagnosis not present

## 2020-01-13 DIAGNOSIS — E669 Obesity, unspecified: Secondary | ICD-10-CM | POA: Diagnosis not present

## 2020-01-13 DIAGNOSIS — E785 Hyperlipidemia, unspecified: Secondary | ICD-10-CM | POA: Diagnosis not present

## 2020-01-13 DIAGNOSIS — Z1331 Encounter for screening for depression: Secondary | ICD-10-CM | POA: Diagnosis not present

## 2020-01-13 LAB — IGE: IgE (Immunoglobulin E), Serum: 11 kU/L (ref <=114)

## 2020-01-13 LAB — ANA,IFA RA DIAG PNL W/RFLX TIT/PATN
Anti Nuclear Antibody (ANA): POSITIVE — AB
Cyclic Citrullin Peptide Ab: <16 UNITS
Rhuematoid fact SerPl-aCnc: 14 IU/mL — ABNORMAL HIGH (ref <14)

## 2020-01-13 LAB — ANTI-SCLERODERMA ANTIBODY: Scleroderma (Scl-70) (ENA) Antibody, IgG: <1 AI

## 2020-01-13 LAB — SJOGREN'S SYNDROME ANTIBODS(SSA + SSB)
SSA (Ro) (ENA) Antibody, IgG: <1 AI
SSB (La) (ENA) Antibody, IgG: <1 AI

## 2020-01-13 LAB — ANTI-NUCLEAR AB-TITER (ANA TITER)
ANA TITER: 1:1280 {titer} — ABNORMAL HIGH
ANA Titer 1: 1:1280 {titer} — ABNORMAL HIGH

## 2020-01-13 LAB — ANCA SCREEN W REFLEX TITER: ANCA Screen: NEGATIVE

## 2020-01-16 LAB — HYPERSENSITIVITY PNEUMONITIS
A. Pullulans Abs: NEGATIVE
A.Fumigatus #1 Abs: NEGATIVE
Micropolyspora faeni, IgG: NEGATIVE
Pigeon Serum Abs: NEGATIVE
Thermoact. Saccharii: NEGATIVE
Thermoactinomyces vulgaris, IgG: NEGATIVE

## 2020-01-20 ENCOUNTER — Institutional Professional Consult (permissible substitution): Payer: Medicare PPO | Admitting: Pulmonary Disease

## 2020-01-21 ENCOUNTER — Telehealth: Payer: Self-pay | Admitting: Pulmonary Disease

## 2020-01-21 LAB — MYOMARKER 3 PLUS PROFILE (RDL)
Anti-EJ Ab (RDL): NEGATIVE
Anti-Jo-1 Ab (RDL): <20 Units (ref <20)
Anti-Ku Ab (RDL): NEGATIVE
Anti-MDA-5 Ab (CADM-140)(RDL): <20 Units (ref <20)
Anti-Mi-2 Ab (RDL): NEGATIVE
Anti-NXP-2 (P140) Ab (RDL): <20 Units (ref <20)
Anti-OJ Ab (RDL): NEGATIVE
Anti-PL-12 Ab (RDL: NEGATIVE
Anti-PL-7 Ab (RDL): NEGATIVE
Anti-PM/Scl-100 Ab (RDL): <20 Units (ref <20)
Anti-SAE1 Ab, IgG (RDL): <20 Units (ref <20)
Anti-SRP Ab (RDL): NEGATIVE
Anti-SS-A 52kD Ab, IgG (RDL): 31 Units — ABNORMAL HIGH (ref <20)
Anti-TIF-1gamma Ab (RDL): <20 Units (ref <20)
Anti-U1 RNP Ab (RDL): <20 Units (ref <20)
Anti-U2 RNP Ab (RDL): NEGATIVE
Anti-U3 RNP (Fibrillarin)(RDL): NEGATIVE

## 2020-01-21 NOTE — Telephone Encounter (Signed)
Dr. Vaughan Browner will need to be the one to decide this.  Dr. Vaughan Browner - please advise. Thanks.

## 2020-01-21 NOTE — Telephone Encounter (Signed)
Order was placed on 1/29 for pt to have High Res CT.  She was scheduled for 2/11.  When I called pt she states she had CT angio on 1/30 in ER.  Message was sent to PM to see if pt still needs High Res CT.  I gave to Judson Roch as app-of-the-day today to see if CT still needs to be done tomorrow.  She states pt was treated for pneumonia and looks like there is progression on disease on CTA and this may be all Dr Vaughan Browner needs for now.  She told me to reschedule High Res appt and let PM decide if CT is still needed.  I rescheduled pt's CT to 2/25 and she has appt with PM on 2/26.  I made pt aware. Please let me know if this CT needs to be canceled or rescheduled.

## 2020-01-22 ENCOUNTER — Ambulatory Visit (HOSPITAL_COMMUNITY): Payer: Medicare PPO | Attending: Cardiology

## 2020-01-22 ENCOUNTER — Inpatient Hospital Stay: Admission: RE | Admit: 2020-01-22 | Payer: Medicare PPO | Source: Ambulatory Visit

## 2020-01-22 ENCOUNTER — Other Ambulatory Visit: Payer: Self-pay

## 2020-01-22 DIAGNOSIS — R0609 Other forms of dyspnea: Secondary | ICD-10-CM

## 2020-01-22 DIAGNOSIS — R0600 Dyspnea, unspecified: Secondary | ICD-10-CM | POA: Insufficient documentation

## 2020-01-27 NOTE — Telephone Encounter (Signed)
Dr. Mannam - please advise. Thanks. 

## 2020-01-28 NOTE — Telephone Encounter (Signed)
Dr Vaughan Browner is not back in the office until Monday 2/22

## 2020-01-30 NOTE — Telephone Encounter (Signed)
Yes please keep the CT high-resolution appointment Her CTA did not give a clear idea of the interstitial lung disease

## 2020-02-02 NOTE — Telephone Encounter (Signed)
Dr. Vaughan Browner replied to CT message, and would like Patient to keep HRCT appointment for ILD.

## 2020-02-02 NOTE — Telephone Encounter (Signed)
Spoke to pt & made her aware PM wants her to come High Res CT appt.  Nothing further needed.

## 2020-02-05 ENCOUNTER — Other Ambulatory Visit: Payer: Self-pay

## 2020-02-05 ENCOUNTER — Other Ambulatory Visit: Payer: Medicare PPO

## 2020-02-05 ENCOUNTER — Ambulatory Visit (INDEPENDENT_AMBULATORY_CARE_PROVIDER_SITE_OTHER)
Admission: RE | Admit: 2020-02-05 | Discharge: 2020-02-05 | Disposition: A | Discharge status: Home / Self Care | Payer: Medicare PPO | Source: Ambulatory Visit | Attending: Pulmonary Disease | Admitting: Pulmonary Disease

## 2020-02-05 DIAGNOSIS — J849 Interstitial pulmonary disease, unspecified: Secondary | ICD-10-CM | POA: Diagnosis not present

## 2020-02-06 ENCOUNTER — Ambulatory Visit: Payer: Medicare PPO | Admitting: Pulmonary Disease

## 2020-02-06 ENCOUNTER — Ambulatory Visit (INDEPENDENT_AMBULATORY_CARE_PROVIDER_SITE_OTHER): Payer: Medicare PPO

## 2020-02-06 ENCOUNTER — Telehealth: Payer: Self-pay | Admitting: Pulmonary Disease

## 2020-02-06 ENCOUNTER — Encounter: Payer: Self-pay | Admitting: Pulmonary Disease

## 2020-02-06 VITALS — BP 130/80 | HR 86 | Temp 97.1°F | Ht 64.0 in | Wt 191.4 lb

## 2020-02-06 DIAGNOSIS — R0609 Other forms of dyspnea: Secondary | ICD-10-CM

## 2020-02-06 DIAGNOSIS — R0600 Dyspnea, unspecified: Secondary | ICD-10-CM

## 2020-02-06 DIAGNOSIS — J849 Interstitial pulmonary disease, unspecified: Secondary | ICD-10-CM | POA: Diagnosis not present

## 2020-02-06 LAB — COMPREHENSIVE METABOLIC PANEL
ALT: 18 U/L (ref 0–35)
AST: 20 U/L (ref 0–37)
Albumin: 3.4 g/dL — ABNORMAL LOW (ref 3.5–5.2)
Alkaline Phosphatase: 53 U/L (ref 39–117)
BUN: 22 mg/dL (ref 6–23)
CO2: 27 mEq/L (ref 19–32)
Calcium: 8.6 mg/dL (ref 8.4–10.5)
Chloride: 106 mEq/L (ref 96–112)
Creatinine, Ser: 0.88 mg/dL (ref 0.40–1.20)
GFR: 64 mL/min (ref >60.00)
Glucose, Bld: 84 mg/dL (ref 70–99)
Potassium: 3.8 mEq/L (ref 3.5–5.1)
Sodium: 138 mEq/L (ref 135–145)
Total Bilirubin: 0.4 mg/dL (ref 0.2–1.2)
Total Protein: 5.8 g/dL — ABNORMAL LOW (ref 6.0–8.3)

## 2020-02-06 LAB — CBC WITH DIFFERENTIAL/PLATELET
Basophils Absolute: 0.1 10*3/uL (ref 0.0–0.1)
Basophils Relative: 0.4 % (ref 0.0–3.0)
Eosinophils Absolute: 0.5 10*3/uL (ref 0.0–0.7)
Eosinophils Relative: 2.3 % (ref 0.0–5.0)
HCT: 35.7 % — ABNORMAL LOW (ref 36.0–46.0)
Hemoglobin: 11.5 g/dL — ABNORMAL LOW (ref 12.0–15.0)
Lymphocytes Relative: 31.1 % (ref 12.0–46.0)
Lymphs Abs: 6.4 10*3/uL — ABNORMAL HIGH (ref 0.7–4.0)
MCHC: 32.2 g/dL (ref 30.0–36.0)
MCV: 88.8 fl (ref 78.0–100.0)
Monocytes Absolute: 1.2 10*3/uL — ABNORMAL HIGH (ref 0.1–1.0)
Monocytes Relative: 5.8 % (ref 3.0–12.0)
Neutro Abs: 12.4 10*3/uL — ABNORMAL HIGH (ref 1.4–7.7)
Neutrophils Relative %: 60.4 % (ref 43.0–77.0)
Platelets: 383 10*3/uL (ref 150.0–400.0)
RBC: 4.02 Mil/uL (ref 3.87–5.11)
RDW: 15.5 % (ref 11.5–15.5)
WBC: 20.6 10*3/uL (ref 4.0–10.5)

## 2020-02-06 MED ORDER — AZATHIOPRINE 50 MG PO TABS: 50.0000 mg | ORAL_TABLET | Freq: Every day | ORAL | 1 refills | Status: DC

## 2020-02-06 MED ORDER — SULFAMETHOXAZOLE-TRIMETHOPRIM 800-160 MG PO TABS: 1.0000 | ORAL_TABLET | ORAL | 3 refills | Status: DC

## 2020-02-06 NOTE — Progress Notes (Addendum)
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  Previously followed by Dr. Melvyn Novas Complains of chest tightness, dyspnea on exertion for the past 1 year.  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  She had been evaluated by Marella Chimes, PA at [facility] rheumatology.  I do not have these records to review.  She is currently not on any treatment for autoimmune/connective tissue disease.  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  History also notable for coronary artery disease with stent placement at [facility].  Echocardiogram at [facility] last year shows moderate pulmonary hypertension.  Pets: No pets Occupation: Retired Recruitment consultant Exposures: No known exposures.  No mold, hot tub, Jacuzzi.  No down pillows or comforter Smoking history: Never smoked Travel history: No significant travel history Relevant family history: No significant family history of lung  Interim history: Evaluated in end of Jan for elevated WBC count.  SARS-CoV-2 test was negative.  CT with changes of pneumonia and she was given doxycycline in the ED High-res CT scan yesterday does not show ongoing pneumonia but has fibrosis and probable UIP pattern  Currently on prednisone at 40 mg with improvement in symptoms of dyspnea, joint pain Follow-up echocardiogram does not show any evidence of pulmonary hypertension  Outpatient Encounter Medications as of 02/06/2020  Medication Sig  . albuterol (PROVENTIL) (2.5 MG/3ML) 0.083% nebulizer solution  1 vial in the neb every 4 hours as needed  . atorvastatin (LIPITOR) 80 MG tablet Take 1 tablet by mouth daily.  . budesonide-formoterol (SYMBICORT) 80-4.5 MCG/ACT inhaler Take 2 puffs first thing in am and then another 2 puffs about 12 hours later.  . citalopram (CELEXA) 40 MG tablet Take 40 mg by mouth daily.  . clopidogrel (PLAVIX) 75 MG tablet Take 75 mg by mouth daily.  Marland Kitchen doxycycline (VIBRAMYCIN) 100 MG capsule Take 1 capsule (100 mg total) by mouth 2 (two) times daily. One po bid x 7 days  . famotidine (PEPCID) 20 MG tablet One after supper  . levothyroxine (SYNTHROID, LEVOTHROID) 125 MCG tablet Take 125 mcg by mouth daily before breakfast.  . metoprolol succinate (TOPROL-XL) 25 MG 24 hr tablet Take 1 tablet by mouth daily.  . montelukast (SINGULAIR) 10 MG tablet Take 10 mg by mouth every morning.  Marland Kitchen morphine (MSIR) 15 MG tablet Take 15 mg by mouth every 6 (six) hours as needed for moderate pain or severe pain.  . nitroGLYCERIN (NITROSTAT) 0.4 MG SL tablet AS NEEDED  . ondansetron (ZOFRAN ODT) 4 MG disintegrating tablet Take 1 tablet (4 mg total) by mouth every 8 (eight) hours as needed for nausea or vomiting.  . pantoprazole (PROTONIX) 40 MG tablet Take 1 tablet (40 mg total) by mouth daily. Take 30-60 min before first meal of the day  . predniSONE (DELTASONE) 20 MG tablet Take 2 tablets (40 mg total) by mouth daily with breakfast.  . tiZANidine (ZANAFLEX) 4 MG capsule Take 1 capsule (4 mg total) by mouth 3 (three) times daily.   No facility-administered encounter  medications on file as of 02/06/2020.   Physical Exam: Blood pressure 124/78, pulse 85, temperature 98 F (36.7 C), temperature source Temporal, height _0  (1.626 m), weight 185 lb (83.9 kg), SpO2 98 %. Gen:      No acute distress HEENT:  EOMI, sclera anicteric Neck:     No masses; no thyromegaly Lungs:    Clear to auscultation bilaterally; normal respiratory effort CV:         Regular rate and rhythm; no murmurs Abd:       + bowel sounds; soft, non-tender; no palpable masses, no distension Ext:    No edema; adequate peripheral perfusion Skin:      Warm and dry; no rash Neuro: alert and oriented x 3 Psych: normal mood and affect  Data Reviewed: Imaging: CT high-resolution 07/24/2019-patchy groundglass with septal thickening, reticulation, bronchiectasis with no honeycombing.  Basal gradient noted.  Probable UIP pattern  CTA 01/10/2020-No PE, mild bibasal consolidation.  Chronic ILD.  CT high-resolution 02/05/2020-pulmonary fibrosis and probable UIP pattern without progression since 2020. I have reviewed the images personally.  PFTs:  Pending  6-minute walk test 02/06/2020-374 m, nadir O2 sat 94%  Labs: CBC 05/29/2019-WBC 13.2, eos 6.9%, absolute eosinophil count 911  ANA 05/29/2019- > 1:1280 Repeat CTD serologies 01/09/2020-ANA 1 is to 1280, rheumatoid factor 14, positive SSA  SARS-CoV-2 01/10/2020- negative  Cardiac: Echocardiogram ([facility]) 04/18/2019 Normal LV size, LVEF 16-10%, grade 1 diastolic dysfunction.  RVSP of 40-50 consistent with moderate pulmonary  Echocardiogram 01/12/2020 Normal LVEF 96-04%, grade 1 diastolic dysfunction, RVSP 35  Assessment:  Interstitial lung disease Reviewed CT and labs.  She likely has CTD related interstitial lung disease May have underlying asthma given elevated peripheral eosinophils  Awaiting pulmonary function test Currently on prednisone at 40 mg.  Will start Bactrim for PCP prophylaxis Recheck labs today including CBC, TPMT levels, QuantiFERON gold and hepatitis panel We will plan to start azathioprine 50 mg a day if these are normal.  Goal is to taper her off prednisone eventually Referral to pharmacy for education and medication management  We will get records from rheumatology and she will also follow back with them.  Leukocytosis Treated with doxycycline last month.  Currently on prednisone but her WBC count has been elevated prior to  initiation No evidence of pneumonia on high-res CT Repeat CBC.  If still elevated then will do a bronch before initiating immunosuppressive therapy  Hypokalemia Has received potassium supplementation last month Repeat labs to monitor.  Pulmonary hypertension Related to cardiomyopathy, coronary artery disease, diastolic dysfunction versus pulmonary hypertension from connective tissue disease Repeat echocardiogram shows improvement in LVEF and reduction in RVSP.  Continue monitoring.  Plan/Recommendations: - Continue prednisone, start Bactrim 3 times daily - Check CBC, electrolytes, TPMT, QuantiFERON, hepatitis panel - Referral to pharmacy  This appointment required 60 minutes of patient care (this includes precharting, chart review, review of results, face-to-face care, etc.).  Marshell Garfinkel MD [facility] 02/06/2020, 10:55 AM  CC: Moon, Amy A, NP  Addendum: Received and reviewed note from Marella Chimes, [facility] rheumatology dated 02/12/2020  Patient is under evaluation for connective tissue disease Likely Sjogren's syndrome with positive ANA, SSA, sicca syndrome and positive rheumatoid factor  SPEP performed and referred to hematology for evaluation of WBC count  Updated plan: Bronchoscopy with BAL scheduled for 3/18.  If cultures are negative and hematology evaluation is normal then will start immunosuppression, possibly Imuran after coordination with rheumatology  Marshell Garfinkel MD [facility] Please see [email or web address] for pager details.  02/20/2020, 9:18 AM

## 2020-02-06 NOTE — Telephone Encounter (Signed)
Received STAT call report from Santiago Glad at [facility] [place] lab  Patient's WBC = 20.6  Routing to Dr Vaughan Browner

## 2020-02-06 NOTE — Patient Instructions (Addendum)
We will get some labs today including BMP to check potassium levels We will check QuantiFERON gold, hepatitis panel, TMPT, G6PD levels  Based on these results we will start you on a medication called azathioprine 50 mg a day We will refer you to pharmacy for training and education on this new medication We will also get records from Marella Chimes at Dr. Melissa Noon office and make sure that you have a follow-up with them for rheumatology assessment  We will start you on Bactrim double strength 3 times a week for pneumonia prophylaxis Follow-up in 1 month.

## 2020-02-06 NOTE — Progress Notes (Signed)
SIX MIN WALK 02/06/2020 09/18/2019 07/02/2019 05/29/2019  Medications albuterol neb sol 0.083%, atorvastatin 85m, famotidine 273m levothyroxine 12592m metoprolol 73m26montelukast 10mg31mrphine 15mg,32mran 4mg, p85moprazole 40mg at66m5am - - -  Supplimental Oxygen during Test? (L/min) No No - No  Laps 11 - - -  Partial Lap (in Meters) 0 - - -  Baseline BP (sitting) 130/78 - - -  Baseline Heartrate 84 - - -  Baseline Dyspnea (Borg Scale) 4 - - -  Baseline Fatigue (Borg Scale) 3 - - -  Baseline SPO2 99 - - -  BP (sitting) 166/88 - - -  Heartrate 116 - - -  Dyspnea (Borg Scale) 8 - - -  Fatigue (Borg Scale) 5 - - -  SPO2 94 - - -  BP (sitting) 160/82 - - -  Heartrate 86 - - -  SPO2 97 - - -  Stopped or Paused before Six Minutes No - - -  Interpretation Hip pain;Calf pain - - -  Distance Completed 374 - - -  Tech Comments: Pt walked at an average pace completing the entire 6 min without stopping. Pt did stagger some during the walk and also coughed throughout the walk. pt walked a very slow pace for approx 20 steps and stopped 3 x due to SOB and back pain//lmr average pace/SOB//lmr average pace/SOB//lmr

## 2020-02-09 ENCOUNTER — Telehealth: Payer: Self-pay | Admitting: Pulmonary Disease

## 2020-02-09 MED ORDER — BUDESONIDE-FORMOTEROL FUMARATE 80-4.5 MCG/ACT IN AERO: INHALATION_SPRAY | RESPIRATORY_TRACT | 11 refills | Status: DC

## 2020-02-09 NOTE — Telephone Encounter (Signed)
Called and spoke with pt's husband and verified preferred pharmacy. Rx for pt's symbicort has been sent to pharmacy for pt. Nothing further needed.

## 2020-02-10 LAB — HEPB+HEPC+HIV PANEL
HIV Screen 4th Generation wRfx: NONREACTIVE
Hep B C IgM: NEGATIVE
Hep B Core Total Ab: NEGATIVE
Hep B E Ab: NEGATIVE
Hep B E Ag: NEGATIVE
Hep B Surface Ab, Qual: REACTIVE
Hep C Virus Ab: <0.1 s/co ratio (ref 0.0–0.9)
Hepatitis B Surface Ag: NEGATIVE

## 2020-02-12 DIAGNOSIS — E669 Obesity, unspecified: Secondary | ICD-10-CM | POA: Diagnosis not present

## 2020-02-12 DIAGNOSIS — I272 Pulmonary hypertension, unspecified: Secondary | ICD-10-CM | POA: Diagnosis not present

## 2020-02-12 DIAGNOSIS — R05 Cough: Secondary | ICD-10-CM | POA: Diagnosis not present

## 2020-02-12 DIAGNOSIS — G894 Chronic pain syndrome: Secondary | ICD-10-CM | POA: Diagnosis not present

## 2020-02-12 DIAGNOSIS — J849 Interstitial pulmonary disease, unspecified: Secondary | ICD-10-CM | POA: Diagnosis not present

## 2020-02-12 DIAGNOSIS — R0602 Shortness of breath: Secondary | ICD-10-CM | POA: Diagnosis not present

## 2020-02-12 DIAGNOSIS — D72829 Elevated white blood cell count, unspecified: Secondary | ICD-10-CM | POA: Diagnosis not present

## 2020-02-12 DIAGNOSIS — R768 Other specified abnormal immunological findings in serum: Secondary | ICD-10-CM | POA: Diagnosis not present

## 2020-02-13 LAB — QUANTIFERON-TB GOLD PLUS
Mitogen-NIL: 9.91 IU/mL
NIL: 0.02 IU/mL
QuantiFERON-TB Gold Plus: NEGATIVE
TB1-NIL: 0.01 IU/mL
TB2-NIL: 0.02 IU/mL

## 2020-02-13 LAB — THIOPURINE METHYLTRANSFERASE (TPMT), RBC: Thiopurine Methyltransferase, RBC: 17 nmol/hr/mL RBC

## 2020-02-13 LAB — GLUCOSE 6 PHOSPHATE DEHYDROGENASE: G-6PDH: 19.9 U/g Hgb (ref 7.0–20.5)

## 2020-02-17 ENCOUNTER — Institutional Professional Consult (permissible substitution): Payer: Medicare PPO | Admitting: Pulmonary Disease

## 2020-02-19 ENCOUNTER — Telehealth: Payer: Self-pay | Admitting: Pulmonary Disease

## 2020-02-19 NOTE — Telephone Encounter (Signed)
Called and spoke to pt. Pt questioning if she needs to have the covid test prior to procedure even tho she has had both covid vaccines. Advised pt at this time she will still need the covid test and to be sure to keep her appt. Pt verbalized understanding and denied any further questions or concerns at this time.

## 2020-02-19 NOTE — Telephone Encounter (Signed)
Pt added at end of call that she's got some clusters of small clear blisters in her nose and mouth due to Lupus -- needs a prescription for some relief.

## 2020-02-20 ENCOUNTER — Telehealth: Payer: Self-pay

## 2020-02-20 NOTE — Telephone Encounter (Signed)
I have spoken with patient and informed her of the new Bronchoscopy date and scheduled her for pre-procedure covid testing.

## 2020-02-20 NOTE — Telephone Encounter (Signed)
ATC patient and had to leave a voicemail. Her Bronch procedure has had to be changed due to Dr. Matilde Bash schedule. We have it rescheduled to 02/26/20 at 1pm arrive at 12 at [facility]. I will need to reschedule her Covid test to 02/23/20.

## 2020-02-21 ENCOUNTER — Other Ambulatory Visit (HOSPITAL_COMMUNITY): Payer: Medicare PPO

## 2020-02-23 ENCOUNTER — Other Ambulatory Visit (HOSPITAL_COMMUNITY)
Admission: RE | Admit: 2020-02-23 | Discharge: 2020-02-23 | Disposition: A | Discharge status: Home / Self Care | Payer: Medicare PPO | Source: Ambulatory Visit | Attending: Pulmonary Disease | Admitting: Pulmonary Disease

## 2020-02-23 DIAGNOSIS — H5213 Myopia, bilateral: Secondary | ICD-10-CM | POA: Diagnosis not present

## 2020-02-23 DIAGNOSIS — Z20822 Contact with and (suspected) exposure to covid-19: Secondary | ICD-10-CM | POA: Diagnosis not present

## 2020-02-23 DIAGNOSIS — H3530 Unspecified macular degeneration: Secondary | ICD-10-CM | POA: Diagnosis not present

## 2020-02-23 DIAGNOSIS — H52223 Regular astigmatism, bilateral: Secondary | ICD-10-CM | POA: Diagnosis not present

## 2020-02-23 DIAGNOSIS — Z01812 Encounter for preprocedural laboratory examination: Secondary | ICD-10-CM | POA: Diagnosis not present

## 2020-02-23 DIAGNOSIS — H25813 Combined forms of age-related cataract, bilateral: Secondary | ICD-10-CM | POA: Diagnosis not present

## 2020-02-23 DIAGNOSIS — H524 Presbyopia: Secondary | ICD-10-CM | POA: Diagnosis not present

## 2020-02-23 DIAGNOSIS — H353131 Nonexudative age-related macular degeneration, bilateral, early dry stage: Secondary | ICD-10-CM | POA: Diagnosis not present

## 2020-02-23 LAB — SARS CORONAVIRUS 2 (TAT 6-24 HRS): SARS Coronavirus 2: NEGATIVE

## 2020-02-26 ENCOUNTER — Other Ambulatory Visit: Payer: Self-pay

## 2020-02-26 ENCOUNTER — Encounter (HOSPITAL_COMMUNITY)
Admission: RE | Disposition: A | Discharge status: Home / Self Care | Payer: Self-pay | Source: Home / Self Care | Attending: Pulmonary Disease

## 2020-02-26 ENCOUNTER — Ambulatory Visit (HOSPITAL_COMMUNITY)
Admission: RE | Admit: 2020-02-26 | Discharge: 2020-02-26 | Disposition: A | Discharge status: Home / Self Care | Payer: Medicare PPO | Attending: Pulmonary Disease | Admitting: Pulmonary Disease

## 2020-02-26 ENCOUNTER — Ambulatory Visit (HOSPITAL_COMMUNITY): Payer: Medicare PPO

## 2020-02-26 DIAGNOSIS — Z7902 Long term (current) use of antithrombotics/antiplatelets: Secondary | ICD-10-CM | POA: Diagnosis not present

## 2020-02-26 DIAGNOSIS — D72829 Elevated white blood cell count, unspecified: Secondary | ICD-10-CM | POA: Insufficient documentation

## 2020-02-26 DIAGNOSIS — Z79899 Other long term (current) drug therapy: Secondary | ICD-10-CM | POA: Diagnosis not present

## 2020-02-26 DIAGNOSIS — J849 Interstitial pulmonary disease, unspecified: Secondary | ICD-10-CM | POA: Diagnosis not present

## 2020-02-26 DIAGNOSIS — M359 Systemic involvement of connective tissue, unspecified: Secondary | ICD-10-CM | POA: Insufficient documentation

## 2020-02-26 DIAGNOSIS — Z9889 Other specified postprocedural states: Secondary | ICD-10-CM

## 2020-02-26 DIAGNOSIS — Z79890 Hormone replacement therapy (postmenopausal): Secondary | ICD-10-CM | POA: Diagnosis not present

## 2020-02-26 DIAGNOSIS — R918 Other nonspecific abnormal finding of lung field: Secondary | ICD-10-CM | POA: Diagnosis not present

## 2020-02-26 DIAGNOSIS — Z7951 Long term (current) use of inhaled steroids: Secondary | ICD-10-CM | POA: Diagnosis not present

## 2020-02-26 HISTORY — PX: VIDEO BRONCHOSCOPY: SHX5072

## 2020-02-26 HISTORY — PX: BRONCHIAL WASHINGS: SHX5105

## 2020-02-26 LAB — BODY FLUID CELL COUNT WITH DIFFERENTIAL
Eos, Fluid: 4 %
Lymphs, Fluid: 42 %
Monocyte-Macrophage-Serous Fluid: 29 % — ABNORMAL LOW (ref 50–90)
Neutrophil Count, Fluid: 25 % (ref 0–25)
Other Cells, Fluid: 0 %
Total Nucleated Cell Count, Fluid: 113 cu mm (ref 0–1000)

## 2020-02-26 SURGERY — VIDEO BRONCHOSCOPY WITHOUT FLUORO
Anesthesia: Moderate Sedation

## 2020-02-26 MED ORDER — MIDAZOLAM HCL (PF) 10 MG/2ML IJ SOLN
INTRAMUSCULAR | Status: DC | PRN
  Administered 2020-02-26 (×4): 1 mg via INTRAVENOUS

## 2020-02-26 MED ORDER — ONDANSETRON 4 MG PO TBDP: 4.0000 mg | ORAL_TABLET | Freq: Three times a day (TID) | ORAL | 0 refills | Status: DC | PRN

## 2020-02-26 MED ORDER — FENTANYL CITRATE (PF) 100 MCG/2ML IJ SOLN
INTRAMUSCULAR | Status: DC | PRN
  Administered 2020-02-26 (×3): 25 ug via INTRAVENOUS

## 2020-02-26 MED ORDER — LIDOCAINE HCL 1 % IJ SOLN: 4.0000 mL | Freq: Once | INTRAMUSCULAR | Status: DC

## 2020-02-26 MED ORDER — LIDOCAINE HCL 1 % IJ SOLN
INTRAMUSCULAR | Status: DC | PRN
  Administered 2020-02-26: 4 mL via RESPIRATORY_TRACT
  Administered 2020-02-26: 8 mL

## 2020-02-26 MED ORDER — PHENYLEPHRINE HCL 0.5 % NA SOLN
NASAL | Status: DC | PRN
  Administered 2020-02-26: 1 [drp] via NASAL

## 2020-02-26 MED ORDER — LIDOCAINE HCL URETHRAL/MUCOSAL 2 % EX GEL
CUTANEOUS | Status: DC | PRN
  Administered 2020-02-26: 1 via TOPICAL

## 2020-02-26 MED ORDER — MIDAZOLAM HCL (PF) 5 MG/ML IJ SOLN
INTRAMUSCULAR | Status: AC
  Filled 2020-02-26: qty 2

## 2020-02-26 MED ORDER — PHENYLEPHRINE HCL 0.25 % NA SOLN: 1.0000 | Freq: Four times a day (QID) | NASAL | Status: DC | PRN

## 2020-02-26 MED ORDER — BUTAMBEN-TETRACAINE-BENZOCAINE 2-2-14 % EX AERO
INHALATION_SPRAY | CUTANEOUS | Status: DC | PRN
  Administered 2020-02-26: 2 via TOPICAL

## 2020-02-26 MED ORDER — BUTAMBEN-TETRACAINE-BENZOCAINE 2-2-14 % EX AERO: 1.0000 | INHALATION_SPRAY | Freq: Once | CUTANEOUS | Status: DC

## 2020-02-26 MED ORDER — LIDOCAINE HCL URETHRAL/MUCOSAL 2 % EX GEL: 1.0000 "application " | Freq: Once | CUTANEOUS | Status: DC

## 2020-02-26 MED ORDER — FENTANYL CITRATE (PF) 100 MCG/2ML IJ SOLN
INTRAMUSCULAR | Status: AC
  Filled 2020-02-26: qty 4

## 2020-02-26 MED ORDER — SODIUM CHLORIDE 0.9 % IV SOLN
INTRAVENOUS | Status: AC | PRN
  Administered 2020-02-26: 500 mL via INTRAMUSCULAR

## 2020-02-26 NOTE — H&P (Signed)
Kristie [name]    [id number]    [birth year]  Primary Care Physician:Moon, Amy A, NP  Referring Physician: No referring provider defined for this encounter.  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  Evaluated in end of Jan for elevated WBC count.  SARS-CoV-2 test was negative.  CT with changes of pneumonia and she was given doxycycline in the ED High-res CT scan shopws has fibrosis and probable UIP pattern  Currently on prednisone at 40 mg with improvement in symptoms of dyspnea, joint pain Follow-up echocardiogram does not show any evidence of pulmonary hypertension  Outpatient Encounter Medications as of 02/06/2020  Medication Sig  . albuterol (PROVENTIL) (2.5 MG/3ML) 0.083% nebulizer solution 1 vial in the neb every 4 hours as needed  . atorvastatin (LIPITOR) 80 MG tablet Take 1 tablet by mouth daily.  . budesonide-formoterol (SYMBICORT) 80-4.5 MCG/ACT inhaler Take 2 puffs first thing in am and then another 2 puffs about 12 hours later.  . citalopram (CELEXA) 40 MG tablet Take 40 mg by mouth daily.  . clopidogrel (PLAVIX) 75 MG tablet Take 75 mg by mouth daily.  Marland Kitchen doxycycline (VIBRAMYCIN) 100 MG capsule Take 1 capsule (100 mg total) by mouth 2 (two) times daily. One po bid x 7 days  . famotidine (PEPCID) 20 MG tablet One after supper  . levothyroxine (SYNTHROID, LEVOTHROID) 125 MCG tablet Take 125 mcg by mouth daily before breakfast.  . metoprolol succinate (TOPROL-XL) 25 MG 24 hr tablet Take 1 tablet by mouth daily.  . montelukast (SINGULAIR) 10 MG tablet Take 10 mg by mouth every morning.  Marland Kitchen morphine (MSIR) 15 MG tablet Take 15 mg by mouth every 6 (six) hours as needed for moderate pain or severe pain.  . nitroGLYCERIN (NITROSTAT) 0.4 MG SL tablet AS NEEDED  . ondansetron (ZOFRAN ODT) 4 MG disintegrating tablet Take 1 tablet (4 mg total) by mouth every  8 (eight) hours as needed for nausea or vomiting.  . pantoprazole (PROTONIX) 40 MG tablet Take 1 tablet (40 mg total) by mouth daily. Take 30-60 min before first meal of the day  . predniSONE (DELTASONE) 20 MG tablet Take 2 tablets (40 mg total) by mouth daily with breakfast.  . tiZANidine (ZANAFLEX) 4 MG capsule Take 1 capsule (4 mg total) by mouth 3 (three) times daily.   No facility-administered encounter medications on file as of 02/06/2020.   Physical Exam: Blood pressure (!) 179/93, temperature (!) 96.1 F (35.6 C), temperature source Temporal, resp. rate 19, height _0  (1.626 m), weight 85.3 kg, SpO2 98 %. Gen:      No acute distress HEENT:  EOMI, sclera anicteric Neck:     No masses; no thyromegaly Lungs:    Clear to auscultation bilaterally; normal respiratory effort CV:         Regular rate and rhythm; no murmurs Abd:      + bowel sounds; soft, non-tender; no palpable masses, no distension Ext:    No edema; adequate peripheral perfusion Skin:      Warm and dry; no rash Neuro: alert and oriented x 3 Psych: normal mood and affect  Data Reviewed: Imaging: CT high-resolution 07/24/2019-patchy groundglass with septal thickening, reticulation, bronchiectasis with no honeycombing.  Basal gradient noted.  Probable UIP pattern  CTA 01/10/2020-No PE, mild bibasal consolidation.  Chronic ILD.  CT high-resolution 02/05/2020-pulmonary fibrosis and probable UIP pattern without progression  since 2020. I have reviewed the images personally.  PFTs:  Pending  6-minute walk test 02/06/2020-374 m, nadir O2 sat 94%  Labs: CBC 05/29/2019-WBC 13.2, eos 6.9%, absolute eosinophil count 911  ANA 05/29/2019- > 1:1280 Repeat CTD serologies 01/09/2020-ANA 1 is to 1280, rheumatoid factor 14, positive SSA  SARS-CoV-2 01/10/2020- negative  Cardiac: Echocardiogram ([facility]) 04/18/2019 Normal LV size, LVEF 01-09%, grade 1 diastolic dysfunction.  RVSP of 40-50 consistent with moderate  pulmonary  Echocardiogram 01/12/2020 Normal LVEF 32-35%, grade 1 diastolic dysfunction, RVSP 35  Assessment:  CTD related Interstitial lung disease Leukocytosis Reviewed CT and labs.  She likely has CTD related interstitial lung disease May have underlying asthma given elevated peripheral eosinophils  Currently on prednisone at 40 mg, bactrim Bronch with BAL today to evaluate for infection before starting immunosuppressive therapy.  Risk benefit discussed with patient and she has agreed to proceed  Plan/Recommendations: Bronchoscopy with BAL  Marshell Garfinkel MD [facility] 02/26/2020, 1:00 PM  CC: No ref. provider found

## 2020-02-26 NOTE — Discharge Instructions (Signed)
Flexible Bronchoscopy, Care After This sheet gives you information about how to care for yourself after your test. Your doctor may also give you more specific instructions. If you have problems or questions, contact your doctor. Follow these instructions at home: Eating and drinking  Do not eat or drink anything (not even water) for 2 hours after your test, or until your numbing medicine (local anesthetic) wears off.  When your numbness is gone and your cough and gag reflexes have come back, you may: ? Eat only soft foods. ? Slowly drink liquids.  The day after the test, go back to your normal diet. Driving  Do not drive for 24 hours if you were given a medicine to help you relax (sedative).  Do not drive or use heavy machinery while taking prescription pain medicine. General instructions   Take over-the-counter and prescription medicines only as told by your doctor.  Return to your normal activities as told. Ask what activities are safe for you.  Do not use any products that have nicotine or tobacco in them. This includes cigarettes and e-cigarettes. If you need help quitting, ask your doctor.  Keep all follow-up visits as told by your doctor. This is important. It is very important if you had a tissue sample (biopsy) taken. Get help right away if:  You have shortness of breath that gets worse.  You get light-headed.  You feel like you are going to pass out (faint).  You have chest pain.  You cough up: ? More than a little blood. ? More blood than before. Summary  Do not eat or drink anything (not even water) for 2 hours after your test, or until your numbing medicine wears off.  Do not use cigarettes. Do not use e-cigarettes.  Get help right away if you have chest pain. This information is not intended to replace advice given to you by your health care provider. Make sure you discuss any questions you have with your health care provider. Document Revised: 11/09/2017  Document Reviewed: 12/15/2016 Elsevier Patient Education  [place].    Moderate Conscious Sedation, Adult, Care After These instructions provide you with information about caring for yourself after your procedure. Your health care provider may also give you more specific instructions. Your treatment has been planned according to current medical practices, but problems sometimes occur. Call your health care provider if you have any problems or questions after your procedure. What can I expect after the procedure? After your procedure, it is common:  To feel sleepy for several hours.  To feel clumsy and have poor balance for several hours.  To have poor judgment for several hours.  To vomit if you eat too soon. Follow these instructions at home: For at least 24 hours after the procedure:   Do not: ? Participate in activities where you could fall or become injured. ? Drive. ? Use heavy machinery. ? Drink alcohol. ? Take sleeping pills or medicines that cause drowsiness. ? Make important decisions or sign legal documents. ? Take care of children on your own.  Rest. Eating and drinking  Follow the diet recommended by your health care provider.  If you vomit: ? Drink water, juice, or soup when you can drink without vomiting. ? Make sure you have little or no nausea before eating solid foods. General instructions  Have a responsible adult stay with you until you are awake and alert.  Take over-the-counter and prescription medicines only as told by your health care provider.  If you smoke, do not smoke without supervision.  Keep all follow-up visits as told by your health care provider. This is important. Contact a health care provider if:  You keep feeling nauseous or you keep vomiting.  You feel light-headed.  You develop a rash.  You have a fever. Get help right away if:  You have trouble breathing. This information is not intended to replace advice  given to you by your health care provider. Make sure you discuss any questions you have with your health care provider. Document Revised: 11/09/2017 Document Reviewed: 03/18/2016 Elsevier Patient Education  2020 [employer].

## 2020-02-26 NOTE — Op Note (Signed)
[facility] Cardiopulmonary Patient Name: Kristie [name] Procedure Date: 02/26/2020 MRN: [id number] Attending MD: Marshell Garfinkel , MD Date of Birth: [birth year] CSN: [id number] Age: 69 Admit Type: Outpatient Ethnicity: Not Hispanic or Latino Procedure:             Bronchoscopy Indications:           Interstitial lung disease Providers:             Marshell Garfinkel, MD, Glori Bickers, RN, Elmer Ramp. Hinson,                         RN, Lina Sar, Technician Referring MD:           Medicines:             Midazolam 4 mg IV, Fentanyl 75 mcg IV Complications:         No immediate complications Estimated Blood Loss:  Estimated blood loss: none. Procedure:      Pre-Anesthesia Assessment:      - A History and Physical has been performed. Patient meds and allergies       have been reviewed. The risks and benefits of the procedure and the       sedation options and risks were discussed with the patient. All       questions were answered and informed consent was obtained. Patient       identification and proposed procedure were verified prior to the       procedure by the physician in the pre-procedure area. Mental Status       Examination: alert and oriented. Airway Examination: normal       oropharyngeal airway. Respiratory Examination: clear to auscultation. CV       Examination: RRR, no murmurs, no S3 or S4. ASA Grade Assessment: II - A       patient with mild systemic disease. After reviewing the risks and       benefits, the patient was deemed in satisfactory condition to undergo       the procedure. The anesthesia plan was to use moderate sedation /       analgesia (conscious sedation). Immediately prior to administration of       medications, the patient was re-assessed for adequacy to receive       sedatives. The heart rate, respiratory rate, oxygen saturations, blood       pressure, adequacy of pulmonary ventilation, and response to care were       monitored throughout the  procedure. The physical status of the patient       was re-assessed after the procedure.      After obtaining informed consent, the bronchoscope was passed under       direct vision. Throughout the procedure, the patient's blood pressure,       pulse, and oxygen saturations were monitored continuously. the BF-H190       ([id number]) Olympus Bronchoscope was introduced through the right nostril       and advanced to the tracheobronchial tree of both lungs. The procedure       was accomplished with ease. The patient tolerated the procedure well.       The total duration of the procedure was 25 minutes. Findings:      The oropharynx appears normal. The larynx appears normal. The vocal       cords appear normal. The subglottic space is normal. The trachea is of  normal caliber. The carina is sharp. The tracheobronchial tree was       examined to at least the first subsegmental level. Bronchial mucosa and       anatomy are normal; there are no endobronchial lesions, and no       secretions.      Bronchoalveolar lavage was performed in the RML medial segment (B5) of       the lung and sent for cell count, bacterial culture, viral smears &       culture, and fungal & AFB analysis and cytology. 180 mL of fluid were       instilled. 100 mL were returned. The return was cloudy. There were no       mucoid plugs in the return fluid. Multiple specimens were obtained and       pooled into one specimen, which was sent for analysis. Impression:      - Interstitial lung disease      - The airway examination was normal.      - Bronchoalveolar lavage was performed.      - Interstitial lung disease Moderate Sedation:      Moderate (conscious) sedation was administered by the endoscopy nurse       and supervised by the endoscopist. The following parameters were       monitored: oxygen saturation, heart rate, blood pressure, respiratory       rate, EKG, adequacy of pulmonary ventilation, and response to  care.       Total physician intraservice time was 25 minutes. Recommendation:      - Await BAL results. Procedure Code(s):      --- Professional ---      [phone], Bronchoscopy, rigid or flexible, including fluoroscopic guidance,       when performed; with bronchial alveolar lavage      [id number], Moderate sedation services provided by the same physician or       other qualified health care professional performing the diagnostic or       therapeutic service that the sedation supports, requiring the presence       of an independent trained observer to assist in the monitoring of the       patient's level of consciousness and physiological status; initial 15       minutes of intraservice time, patient age 42 years or older      [phone], Moderate sedation; each additional 15 minutes intraservice time Diagnosis Code(s):      --- Professional ---      J84.9, Interstitial pulmonary disease, unspecified CPT copyright 2019 American Medical Association. All rights reserved. The codes documented in this report are preliminary and upon coder review may  be revised to meet current compliance requirements. Marshell Garfinkel, MD 02/26/2020 2:08:14 PM Number of Addenda: 0 Scope In: Scope Out:

## 2020-02-27 ENCOUNTER — Encounter: Payer: Self-pay | Admitting: *Deleted

## 2020-02-27 LAB — CYTOLOGY - NON PAP

## 2020-02-29 LAB — CULTURE, RESPIRATORY W GRAM STAIN: Culture: NO GROWTH

## 2020-03-01 LAB — PNEUMOCYSTIS JIROVECI SMEAR BY DFA: Pneumocystis jiroveci Ag: NEGATIVE

## 2020-03-05 ENCOUNTER — Telehealth: Payer: Self-pay | Admitting: Pulmonary Disease

## 2020-03-05 MED ORDER — AZATHIOPRINE 50 MG PO TABS: 50.0000 mg | ORAL_TABLET | Freq: Every day | ORAL | 2 refills | Status: DC

## 2020-03-05 NOTE — Telephone Encounter (Signed)
I went ahead and sent Rx to pharm  I have called the pt and have [place] in triage

## 2020-03-05 NOTE — Telephone Encounter (Signed)
Yes. Let her know cultures from bronchoscopy are negative to date for any infection Start azathioprine 50 mg a day.  Follow-up with pharmacy clinic and with me as scheduled.

## 2020-03-05 NOTE — Progress Notes (Signed)
HPI  Patient presents today to [facility] Pulmonary for Initial appt with pharmacy team for Imuran counseling for ILD.  She is also followed by [facility] rheumatology for suspected underlying connective tissue disease and Sjogren's syndrome.  She has a history of positive ANA, SSA, rheumatoid factor, and sicca symptoms.  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 She is currently taking prednisone 20 mg twice a day.  She is nave to immunosuppressant therapy.  She is unsure if she has started Imuran but has started Bactrim DS 1 tablet three times a week and is tolerating well.  OBJECTIVE Allergies  Allergen Reactions  . Penicillins Itching and Other (See Comments)    "Cillin" Family - Amoxicillin - itching    Did it involve swelling of the face/tongue/throat, SOB, or low BP? Yes Did it involve sudden or severe rash/hives, skin peeling, or any reaction on the inside of your mouth or nose? no Did you need to seek medical attention at a hospital or doctor's office? yes When did it last happen?30 years If all above answers are "NO", may proceed with cephalosporin use.  Marland Kitchen Doxycycline Rash    Outpatient Encounter Medications as of 03/09/2020  Medication Sig  . albuterol (PROVENTIL) (2.5 MG/3ML) 0.083% nebulizer solution Take 2.5 mg by nebulization every 4 (four) hours as needed for wheezing or shortness of breath.   Marland Kitchen aspirin EC 81 MG tablet Take 81 mg by mouth daily.  Marland Kitchen atorvastatin (LIPITOR) 80 MG tablet Take 80 mg by mouth daily.   Marland Kitchen azaTHIOprine (IMURAN) 50 MG tablet Take 50 mg by mouth daily.  . budesonide-formoterol (SYMBICORT) 80-4.5 MCG/ACT inhaler Take 2 puffs first thing in am and then  another 2 puffs about 12 hours later. (Patient taking differently: Inhale 2 puffs into the lungs in the morning and at bedtime. )  . citalopram (CELEXA) 40 MG tablet Take 40 mg by mouth at bedtime.   . clopidogrel (PLAVIX) 75 MG tablet Take 75 mg by mouth daily.  . famotidine (PEPCID) 20 MG tablet One after supper (Patient taking differently: Take 20 mg by mouth every evening. One after supper)  . hydrOXYzine (ATARAX/VISTARIL) 25 MG tablet Take 25-50 mg by mouth at bedtime.  Marland Kitchen ibuprofen (ADVIL) 800 MG tablet Take 800 mg by mouth daily as needed for mild pain or moderate pain.   Marland Kitchen levothyroxine (SYNTHROID, LEVOTHROID) 125 MCG tablet Take 125 mcg by mouth daily before breakfast.  . metoprolol succinate (TOPROL-XL) 25 MG 24 hr tablet Take 25 mg by mouth daily.   . montelukast (SINGULAIR) 10 MG tablet Take 10 mg by mouth daily.   Marland Kitchen morphine (MSIR) 15 MG tablet Take 15 mg by mouth 3 (three) times daily as needed for moderate pain or severe pain.   . nitroGLYCERIN (NITROSTAT) 0.4 MG SL tablet Place 0.4 mg under the tongue every 5 (five) minutes as needed for chest pain. AS NEEDED  . ondansetron (ZOFRAN ODT) 4 MG disintegrating tablet Take 1 tablet (4 mg total) by mouth every 8 (eight) hours as needed for nausea or vomiting.  . pantoprazole (PROTONIX) 40 MG tablet Take 1 tablet (40 mg total) by mouth daily. Take 30-60 min before first meal of the day  . pimecrolimus (ELIDEL) 1 % cream Apply 1 application topically  2 (two) times daily.  Marland Kitchen sulfamethoxazole-trimethoprim (BACTRIM DS) 800-160 MG tablet Take 1 tablet by mouth 3 (three) times a week.  Marland Kitchen tiZANidine (ZANAFLEX) 4 MG capsule Take 1 capsule (4 mg total) by mouth 3 (three) times daily. (Patient taking differently: Take 4 mg by mouth at bedtime. )   No facility-administered encounter medications on file as of 03/09/2020.     Immunization History  Administered Date(s) Administered  . Influenza, High Dose Seasonal PF 08/29/2019      HRCT 02/05/20-Spectrum of findings compatible with basilar predominant fibrotic interstitial lung disease without frank honeycombing and without appreciable interval progression since 07/24/2019 high-resolution chest CT. Findings are categorized as probable UIP per consensus guidelines  PFT's No results found for: FEV1, FVC, FEV1FVC, TLC, DLCO   CMP     Component Value Date/Time   NA 138 02/06/2020 1134   K 3.8 02/06/2020 1134   CL 106 02/06/2020 1134   CO2 27 02/06/2020 1134   GLUCOSE 84 02/06/2020 1134   BUN 22 02/06/2020 1134   CREATININE 0.88 02/06/2020 1134   CALCIUM 8.6 02/06/2020 1134   PROT 5.8 (L) 02/06/2020 1134   ALBUMIN 3.4 (L) 02/06/2020 1134   AST 20 02/06/2020 1134   ALT 18 02/06/2020 1134   ALKPHOS 53 02/06/2020 1134   BILITOT 0.4 02/06/2020 1134   GFRNONAA >60 01/10/2020 1326   GFRAA >60 01/10/2020 1326     CBC    Component Value Date/Time   WBC 20.6 Repeated and verified X2. (HH) 02/06/2020 1134   RBC 4.02 02/06/2020 1134   HGB 11.5 (L) 02/06/2020 1134   HCT 35.7 (L) 02/06/2020 1134   PLT 383.0 02/06/2020 1134   MCV 88.8 02/06/2020 1134   MCH 29.0 01/10/2020 1326   MCHC 32.2 02/06/2020 1134   RDW 15.5 02/06/2020 1134   LYMPHSABS 6.4 (H) 02/06/2020 1134   MONOABS 1.2 (H) 02/06/2020 1134   EOSABS 0.5 02/06/2020 1134   BASOSABS 0.1 02/06/2020 1134     LFT's Hepatic Function Latest Ref Rng & Units 02/06/2020  Total Protein 6.0 - 8.3 g/dL 5.8(L)  Albumin 3.5 - 5.2 g/dL 3.4(L)  AST 0 - 37 U/L 20  ALT 0 - 35 U/L 18  Alk Phosphatase 39 - 117 U/L 53  Total Bilirubin 0.2 - 1.2 mg/dL 0.4     TB GOLD Quantiferon TB Gold Latest Ref Rng & Units 02/06/2020  Quantiferon TB Gold Plus NEGATIVE NEGATIVE    Hepatitis Panel Hepatitis Latest Ref Rng & Units 02/06/2020  Hep B Surface Ag Negative Negative  Hep B IgM Negative Negative    HIV Lab Results  Component Value Date   HIV Non Reactive 02/06/2020    G6PD Lab Results  Component Value Date    G6PDH 19.9 02/06/2020    TPMT Lab Results  Component Value Date   TPMT 17 02/06/2020     ASSESSMENT  1. Imuran Medication Management  Patient was counseled on the purpose, proper use, and adverse effects of azathioprine including risk of infection, nausea, rash, and hair loss.  Also informed that medication can cause discoloration of urine, sweat and tears. Reviewed risk of cancer after long term use.  Discussed risk of myelosupression and reviewed importance of frequent lab work to monitor blood counts.  Standing orders placed. Reviewed drug-drug interactions including contraindication with allopurinol.  Provided patient with educational materials on azathioprine.  Patient dose will be imuran 50 mg 1 tablet daily.  Can increase to 100 mg pending labs and symptoms at  next follow up appointment.  She is to have repeat lab work in 2 weeks, 4 weeks, 8 weeks, then every 3 months.  2. Medication Reconciliation  A drug regimen assessment was performed, including review of allergies, interactions, disease-state management, dosing and immunization history. Medications were reviewed with the patient, including name, instructions, indication, goals of therapy, potential side effects, importance of adherence, and safe use.  3. Immunizations  Patient is up-to-date with annual influenza vaccine.  Recommend Pneumovax 23, Shingrix, and COVID-19 vaccines.  PLAN  Start Imuran 50 mg 1 tablet daily  Continue Bactrim DS 1 tablet three times a week  Lab work in 2 weeks and then in 4 weeks. If you go to the hematologist will not have to repeat blood work if they draw a CBC/CMP.  All questions encouraged and answered.  Instructed patient to call with any further questions or concerns.  Thank you for allowing pharmacy to participate in this patient's care.  This appointment required  60 minutes of patient care (this includes precharting, chart review, review of results, face-to-face care, etc.).     Mariella Saa, PharmD, [place] [place] [facility] Pharmacist [phone]  03/09/2020 3:37 PM

## 2020-03-05 NOTE — Telephone Encounter (Signed)
Received fax from [facility] [address] in [place] requesting refill on Azathioprine 50 mg 1 qd   Please advise on refill Last note mentioned starting med post bronch

## 2020-03-06 ENCOUNTER — Other Ambulatory Visit (HOSPITAL_COMMUNITY)
Admission: RE | Admit: 2020-03-06 | Discharge: 2020-03-06 | Disposition: A | Discharge status: Home / Self Care | Payer: Medicare PPO | Source: Ambulatory Visit | Attending: Pulmonary Disease | Admitting: Pulmonary Disease

## 2020-03-06 ENCOUNTER — Other Ambulatory Visit (HOSPITAL_COMMUNITY): Payer: Self-pay

## 2020-03-06 DIAGNOSIS — Z20822 Contact with and (suspected) exposure to covid-19: Secondary | ICD-10-CM | POA: Diagnosis not present

## 2020-03-06 DIAGNOSIS — Z01812 Encounter for preprocedural laboratory examination: Secondary | ICD-10-CM | POA: Diagnosis not present

## 2020-03-06 LAB — SARS CORONAVIRUS 2 (TAT 6-24 HRS): SARS Coronavirus 2: NEGATIVE

## 2020-03-08 ENCOUNTER — Telehealth: Payer: Self-pay | Admitting: Hematology and Oncology

## 2020-03-08 NOTE — Telephone Encounter (Signed)
I called and spoke with the pt and notified of recommendations from Dr. Vaughan Browner and she verbalized understanding.

## 2020-03-08 NOTE — Telephone Encounter (Signed)
Received a new hem referral from [facility] Rheumatology for elevated wbc. Kristie [name] has been cld and scheduled to see Dr. Lorenso Courier on 4/16 at 1pm w/labs at 2:15pm. Pt aware to arrive 15 minutes early.

## 2020-03-09 ENCOUNTER — Other Ambulatory Visit: Payer: Self-pay

## 2020-03-09 ENCOUNTER — Ambulatory Visit (INDEPENDENT_AMBULATORY_CARE_PROVIDER_SITE_OTHER): Payer: Medicare PPO | Admitting: Pulmonary Disease

## 2020-03-09 ENCOUNTER — Ambulatory Visit (INDEPENDENT_AMBULATORY_CARE_PROVIDER_SITE_OTHER): Payer: Medicare PPO | Admitting: Pharmacist

## 2020-03-09 DIAGNOSIS — Z5181 Encounter for therapeutic drug level monitoring: Secondary | ICD-10-CM

## 2020-03-09 DIAGNOSIS — J849 Interstitial pulmonary disease, unspecified: Secondary | ICD-10-CM

## 2020-03-09 DIAGNOSIS — R0609 Other forms of dyspnea: Secondary | ICD-10-CM

## 2020-03-09 DIAGNOSIS — R0600 Dyspnea, unspecified: Secondary | ICD-10-CM | POA: Diagnosis not present

## 2020-03-09 DIAGNOSIS — Z7189 Other specified counseling: Secondary | ICD-10-CM

## 2020-03-09 LAB — PULMONARY FUNCTION TEST
DL/VA % pred: 86 %
DL/VA: 3.55 ml/min/mmHg/L
DLCO cor % pred: 44 %
DLCO cor: 9.01 ml/min/mmHg
DLCO unc % pred: 41 %
DLCO unc: 8.44 ml/min/mmHg
FEF 25-75 Post: 1.84 L/sec
FEF 25-75 Pre: 1.15 L/sec
FEF2575-%Change-Post: 59 %
FEF2575-%Pred-Post: 87 %
FEF2575-%Pred-Pre: 54 %
FEV1-%Change-Post: 14 %
FEV1-%Pred-Post: 61 %
FEV1-%Pred-Pre: 53 %
FEV1-Post: 1.51 L
FEV1-Pre: 1.32 L
FEV1FVC-%Change-Post: 14 %
FEV1FVC-%Pred-Pre: 99 %
FEV6-%Change-Post: 0 %
FEV6-%Pred-Post: 55 %
FEV6-%Pred-Pre: 55 %
FEV6-Post: 1.73 L
FEV6-Pre: 1.73 L
FEV6FVC-%Pred-Post: 104 %
FEV6FVC-%Pred-Pre: 104 %
FVC-%Change-Post: 0 %
FVC-%Pred-Post: 54 %
FVC-%Pred-Pre: 54 %
FVC-Post: 1.74 L
FVC-Pre: 1.75 L
Post FEV1/FVC ratio: 87 %
Post FEV6/FVC ratio: 100 %
Pre FEV1/FVC ratio: 76 %
Pre FEV6/FVC Ratio: 100 %
RV % pred: 45 %
RV: 1 L
TLC % pred: 52 %
TLC: 2.75 L

## 2020-03-09 NOTE — Progress Notes (Signed)
PFT done today. 

## 2020-03-09 NOTE — Patient Instructions (Addendum)
Start Imuran 50 mg 1 tablet daily  Continue Bactrim DS 1 tablet three times a week  Lab work in 2 weeks and then in 4 weeks. If you go to the hematologist will not have to repeat blood work if they draw a CBC/CMP.

## 2020-03-18 ENCOUNTER — Ambulatory Visit: Payer: Medicare PPO | Admitting: Pulmonary Disease

## 2020-03-18 ENCOUNTER — Encounter: Payer: Self-pay | Admitting: Pulmonary Disease

## 2020-03-18 ENCOUNTER — Other Ambulatory Visit: Payer: Self-pay

## 2020-03-18 VITALS — BP 120/80 | HR 80 | Temp 97.2°F | Ht 65.0 in | Wt 185.4 lb

## 2020-03-18 DIAGNOSIS — J849 Interstitial pulmonary disease, unspecified: Secondary | ICD-10-CM

## 2020-03-18 DIAGNOSIS — Z5181 Encounter for therapeutic drug level monitoring: Secondary | ICD-10-CM | POA: Diagnosis not present

## 2020-03-18 NOTE — Patient Instructions (Addendum)
You have an autoimmune condition which is likely Sjogren's syndrome which can cause inflammation in the lungs, dry mouth, dry eyes.  I am glad you are doing well on the Imuran Continue current therapy Keep follow-up with hematology If repeat blood counts are okay we will increase the Imuran 100 mg a day Follow-up in 4 weeks.

## 2020-03-18 NOTE — Progress Notes (Signed)
Kristie [name]    [id number]    03/16/1951  Primary Care Physician:[name], Kristie [name], [name]  Referring Physician: Lowella Dandy, [name] [address],  [place] [address]  Chief complaint: Follow-up for CTD related interstitial lung disease  HPI: 69 year old with history of unspecified autoimmune disease, interstitial lung disease.  Previously followed by Kristie [name] Complains of chest tightness, dyspnea on exertion for the past 1 year.  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  She had been evaluated by Kristie [name] at [facility] rheumatology.  I do not have these records to review.  She is currently not on any treatment for autoimmune/connective tissue disease.  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  History also notable for coronary artery disease with stent placement at [facility].  Echocardiogram at [facility] last year shows moderate pulmonary hypertension.  Evaluated in end of Jan 2020 for elevated WBC count.  SARS-CoV-2 test was negative.  CT with changes of pneumonia and she was given doxycycline in the ED High-res CT scan does not show ongoing pneumonia but has fibrosis and probable UIP pattern  Pets: No pets Occupation: Retired Recruitment consultant Exposures: No known exposures.  No mold, hot tub, Jacuzzi.  No down pillows or comforter Smoking history: Never smoked Travel history: No significant travel history Relevant family history: No significant family history of lung  Interim history: Received and reviewed note from Kristie [name], [facility] rheumatology dated 02/12/2020 Patient is under evaluation for connective tissue disease Likely Sjogren's syndrome with positive ANA, SSA, sicca syndrome and positive rheumatoid factor   Since January she has been given [name] prednisone taper,  underwent bronchoscopy to rule out infection.  Started azathioprine on 03/09/2020.  She is tolerating it well.  She is also on Bactrim double strength 3 times daily  She is tolerating this dose well.  States that dyspnea is improved.  Outpatient Encounter Medications as of 03/18/2020  Medication Sig  . albuterol (PROVENTIL) (2.5 MG/3ML) 0.083% nebulizer solution Take 2.5 mg by nebulization every 4 (four) hours as needed for wheezing or shortness of breath.   . Ascorbic Acid (VITAMIN C) 100 MG tablet Take 100 mg by mouth daily.  Marland Kitchen aspirin EC 81 MG tablet Take 81 mg by mouth daily.  Marland Kitchen atorvastatin (LIPITOR) 80 MG tablet Take 80 mg by mouth daily.   Marland Kitchen azaTHIOprine (IMURAN) 50 MG tablet Take 1 tablet (50 mg total) by mouth daily. (Patient not taking: Reported on 03/09/2020)  . budesonide-formoterol (SYMBICORT) 80-4.5 MCG/ACT inhaler Take 2 puffs first thing in am and then another 2 puffs about 12 hours later. (Patient taking differently: Inhale 2 puffs into the lungs in the morning and at bedtime. )  . Calcium 250 MG CAPS Take 500 mg by mouth 2 (two) times daily.  . citalopram (CELEXA) 40 MG tablet Take 40 mg by mouth at bedtime.   . clopidogrel (PLAVIX) 75 MG tablet Take 75 mg by mouth daily.  . famotidine (PEPCID) 20 MG tablet One after supper (Patient taking differently: Take 20 mg by mouth every evening. One after supper)  . hydrOXYzine (ATARAX/VISTARIL) 25 MG tablet Take 25 mg by mouth at bedtime.   Marland Kitchen ibuprofen (ADVIL) 800 MG tablet Take 800 mg by mouth daily as needed for mild pain  or moderate pain.   Marland Kitchen levothyroxine (SYNTHROID, LEVOTHROID) 125 MCG tablet Take 125 mcg by mouth daily before breakfast.  . loratadine-pseudoephedrine (CLARITIN-D 24-HOUR) 10-240 MG 24 hr tablet Take 1 tablet by mouth daily.  . Magnesium 250 MG TABS Take 1 tablet by mouth at bedtime.  . metoprolol succinate (TOPROL-XL) 25 MG 24 hr tablet Take 25 mg by mouth daily.   . montelukast (SINGULAIR) 10 MG tablet Take 10 mg by  mouth daily.   Marland Kitchen morphine (MSIR) 15 MG tablet Take 15 mg by mouth 3 (three) times daily as needed for moderate pain or severe pain.   . Multiple Vitamin (MULTIVITAMIN WITH MINERALS) TABS tablet Take 1 tablet by mouth daily.  . nitroGLYCERIN (NITROSTAT) 0.4 MG SL tablet Place 0.4 mg under the tongue every 5 (five) minutes as needed for chest pain. AS NEEDED  . ondansetron (ZOFRAN ODT) 4 MG disintegrating tablet Take 1 tablet (4 mg total) by mouth every 8 (eight) hours as needed for nausea or vomiting.  . pantoprazole (PROTONIX) 40 MG tablet Take 1 tablet (40 mg total) by mouth daily. Take 30-60 min before first meal of the day  . pimecrolimus (ELIDEL) 1 % cream Apply 1 application topically 2 (two) times daily.  Marland Kitchen sulfamethoxazole-trimethoprim (BACTRIM DS) 800-160 MG tablet Take 1 tablet by mouth 3 (three) times [name] week.  Marland Kitchen tiZANidine (ZANAFLEX) 4 MG capsule Take 1 capsule (4 mg total) by mouth 3 (three) times daily. (Patient taking differently: Take 4 mg by mouth 3 (three) times daily as needed. )  . VITAMIN D, CHOLECALCIFEROL, PO Take 1 tablet by mouth daily.   No facility-administered encounter medications on file as of 03/18/2020.   Physical Exam: Gen:      No acute distress HEENT:  EOMI, sclera anicteric Neck:     No masses; no thyromegaly Lungs:    Clear to auscultation bilaterally; normal respiratory effort CV:         Regular rate and rhythm; no murmurs Abd:      + bowel sounds; soft, non-tender; no palpable masses, no distension Ext:    No edema; adequate peripheral perfusion Skin:      Warm and dry; no rash Neuro: alert and oriented x 3 Psych: normal mood and affect  Data Reviewed: Imaging: CT high-resolution 07/24/2019-patchy groundglass with septal thickening, reticulation, bronchiectasis with no honeycombing.  Basal gradient noted.  Probable UIP pattern  CTA 01/10/2020-No PE, mild bibasal consolidation.  Chronic ILD.  CT high-resolution 02/05/2020-pulmonary fibrosis and probable  UIP pattern without progression since 2020. I have reviewed the images personally.  PFTs:  03/09/2020 FVC 1.74 (54%), FEV1 1.51 [61%],/57, TLC 2.75 [52%], DLCO 8.44 [41%] Severe restriction and diffusion defect.  6-minute walk test 02/06/2020-374 m, nadir O2 sat 94%  Labs: CBC 05/29/2019-WBC 13.2, eos 6.9%, absolute eosinophil count 911  ANA 05/29/2019- > 1:1280 Repeat CTD serologies 01/09/2020-ANA 1 is to 1280, rheumatoid factor 14, positive SSA  SARS-CoV-2 01/10/2020- negative  Bronchoscopy 02/26/2020- Cell count 113, 42% lymphs Microbiology, cytology -negative  Cardiac: Echocardiogram ([facility]) 04/18/2019 Normal LV size, LVEF 62-22%, grade 1 diastolic dysfunction.  RVSP of 40-50 consistent with moderate pulmonary  Echocardiogram 01/12/2020 Normal LVEF 97-98%, grade 1 diastolic dysfunction, RVSP 35  Assessment:  CTD related interstitial lung disease, pulmonary fibrosis Sjogren's syndrome  Currently on Imuran, Bactrim.   She is tolerating it well She will likely get labs at her hematology visit next week.  If normal then will increase Imuran to 100 mg [name] day.  Leukocytosis Negative cultures on BAL.  There is no other evidence of infection She has been referred to hematology for further evaluation.  Sjogren's can be associated with lymphoproliferative disorders.  Pulmonary hypertension Related to cardiomyopathy, coronary artery disease, diastolic dysfunction versus pulmonary hypertension from connective tissue disease Repeat echocardiogram shows improvement in LVEF and reduction in RVSP.  Continue monitoring.  Plan/Recommendations: - Continue Imuran, start Bactrim 3 times daily - Hematology eval.  Marshell Garfinkel MD [facility] 03/18/2020, 1:35 PM  CC: Kristie [name]

## 2020-03-26 ENCOUNTER — Other Ambulatory Visit: Payer: Self-pay

## 2020-03-26 ENCOUNTER — Inpatient Hospital Stay: Payer: Medicare PPO

## 2020-03-26 ENCOUNTER — Inpatient Hospital Stay: Payer: Medicare PPO | Attending: Hematology and Oncology | Admitting: Hematology and Oncology

## 2020-03-26 VITALS — BP 128/79 | HR 84 | Temp 98.2°F | Resp 18 | Ht 65.0 in | Wt 183.4 lb

## 2020-03-26 DIAGNOSIS — D72825 Bandemia: Secondary | ICD-10-CM

## 2020-03-26 DIAGNOSIS — D72829 Elevated white blood cell count, unspecified: Secondary | ICD-10-CM

## 2020-03-26 DIAGNOSIS — R7989 Other specified abnormal findings of blood chemistry: Secondary | ICD-10-CM | POA: Insufficient documentation

## 2020-03-26 DIAGNOSIS — D473 Essential (hemorrhagic) thrombocythemia: Secondary | ICD-10-CM

## 2020-03-26 DIAGNOSIS — E039 Hypothyroidism, unspecified: Secondary | ICD-10-CM | POA: Insufficient documentation

## 2020-03-26 DIAGNOSIS — F419 Anxiety disorder, unspecified: Secondary | ICD-10-CM | POA: Diagnosis not present

## 2020-03-26 DIAGNOSIS — J849 Interstitial pulmonary disease, unspecified: Secondary | ICD-10-CM | POA: Diagnosis not present

## 2020-03-26 DIAGNOSIS — M797 Fibromyalgia: Secondary | ICD-10-CM | POA: Diagnosis not present

## 2020-03-26 DIAGNOSIS — D75839 Thrombocytosis, unspecified: Secondary | ICD-10-CM

## 2020-03-26 DIAGNOSIS — M3500 Sicca syndrome, unspecified: Secondary | ICD-10-CM | POA: Diagnosis not present

## 2020-03-26 LAB — CMP (CANCER CENTER ONLY)
ALT: 34 U/L (ref 0–44)
AST: 57 U/L — ABNORMAL HIGH (ref 15–41)
Albumin: 3.8 g/dL (ref 3.5–5.0)
Alkaline Phosphatase: 119 U/L (ref 38–126)
Anion gap: 9 (ref 5–15)
BUN: 13 mg/dL (ref 8–23)
CO2: 21 mmol/L — ABNORMAL LOW (ref 22–32)
Calcium: 8.9 mg/dL (ref 8.9–10.3)
Chloride: 106 mmol/L (ref 98–111)
Creatinine: 0.86 mg/dL (ref 0.44–1.00)
GFR, Est AFR Am: >60 mL/min (ref >60)
GFR, Estimated: >60 mL/min (ref >60)
Glucose, Bld: 105 mg/dL — ABNORMAL HIGH (ref 70–99)
Potassium: 3.8 mmol/L (ref 3.5–5.1)
Sodium: 136 mmol/L (ref 135–145)
Total Bilirubin: 0.4 mg/dL (ref 0.3–1.2)
Total Protein: 7.2 g/dL (ref 6.5–8.1)

## 2020-03-26 LAB — RETIC PANEL
Immature Retic Fract: 8.4 % (ref 2.3–15.9)
RBC.: 4.61 MIL/uL (ref 3.87–5.11)
Retic Count, Absolute: 49.8 10*3/uL (ref 19.0–186.0)
Retic Ct Pct: 1.1 % (ref 0.4–3.1)
Reticulocyte Hemoglobin: 34.7 pg (ref >27.9)

## 2020-03-26 LAB — CBC WITH DIFFERENTIAL (CANCER CENTER ONLY)
Abs Immature Granulocytes: 0.07 10*3/uL (ref 0.00–0.07)
Basophils Absolute: 0.1 10*3/uL (ref 0.0–0.1)
Basophils Relative: 1 %
Eosinophils Absolute: 1.2 10*3/uL — ABNORMAL HIGH (ref 0.0–0.5)
Eosinophils Relative: 8 %
HCT: 39.9 % (ref 36.0–46.0)
Hemoglobin: 13.2 g/dL (ref 12.0–15.0)
Immature Granulocytes: 1 %
Lymphocytes Relative: 14 %
Lymphs Abs: 2.1 10*3/uL (ref 0.7–4.0)
MCH: 29.1 pg (ref 26.0–34.0)
MCHC: 33.1 g/dL (ref 30.0–36.0)
MCV: 88.1 fL (ref 80.0–100.0)
Monocytes Absolute: 1 10*3/uL (ref 0.1–1.0)
Monocytes Relative: 7 %
Neutro Abs: 10.3 10*3/uL — ABNORMAL HIGH (ref 1.7–7.7)
Neutrophils Relative %: 69 %
Platelet Count: 431 10*3/uL — ABNORMAL HIGH (ref 150–400)
RBC: 4.53 MIL/uL (ref 3.87–5.11)
RDW: 15.1 % (ref 11.5–15.5)
WBC Count: 14.8 10*3/uL — ABNORMAL HIGH (ref 4.0–10.5)
nRBC: 0 % (ref 0.0–0.2)

## 2020-03-26 LAB — IRON AND TIBC
Iron: 46 ug/dL (ref 28–170)
Saturation Ratios: 12 % (ref 10.4–31.8)
TIBC: 381 ug/dL (ref 250–450)
UIBC: 335 ug/dL

## 2020-03-26 LAB — LACTATE DEHYDROGENASE: LDH: 269 U/L — ABNORMAL HIGH (ref 98–192)

## 2020-03-26 LAB — C-REACTIVE PROTEIN: CRP: 0.7 mg/dL (ref <1.0)

## 2020-03-26 LAB — SEDIMENTATION RATE: Sed Rate: 25 mm/hr — ABNORMAL HIGH (ref 0–22)

## 2020-03-26 LAB — SAVE SMEAR(SSMR), FOR PROVIDER SLIDE REVIEW

## 2020-03-26 LAB — FERRITIN: Ferritin: 21 ng/mL (ref 11–307)

## 2020-03-26 NOTE — Progress Notes (Signed)
[place] [phone]   Fax:([phone]) [place] NOTE  Patient Care Team: Lowella Dandy, NP as PCP - General (Internal Medicine)  Hematological/Oncological History # Leukocytosis, Neutrophilic Predominance  1) 04/17/2016: WBC 9.5, Hgb 12.6, MCV 89.7, Plt 321 2) 05/29/2019: WBC 13.2, Hgb 12.3, MCV 88.6, Plt 534 3) 01/10/2020: WBC 24.2, Hgb 11.8, MCV 88.2, Plt 463. ANC 12.6. Patient started on prednisone 59m daily the day prior.  4) 02/06/2020: WBC 20.6, Hgb 11.5, MCV 88.8, Plt 383. ANC 12.4. Patient on steroid therapy. 5) 02/26/2020: last day of prednisone 452mdaily  5) 03/26/2020: establish care with Dr. DoLorenso Courier CHIEF COMPLAINTS/PURPOSE OF CONSULTATION:  "Leukocytosis "  HISTORY OF PRESENTING ILLNESS:  Kristie ELGERSMA89.o. female with medical history significant for hypothyroidism, anxiety, fibromyalgia, ILD and an unspecified autoimmune disease (possible Sjogren's) who presents for evaluation of leukocytosis.   On review of the previous records [name] last noted to have a normal white blood cell count on 04/17/2016 at which time her white blood cell count was 9.5.  Her recent records show that the patient had a leukocytosis of 13.2 on 05/29/2019.  Due to her rheumatological condition thought to be Sjogren's syndrome the patient was started on prednisone on 01/09/2020.  On 01/10/2020 the patient was noted to have a white blood cell count of 24.2 with an elevated platelet count of 463.  Subsequent checks on 02/06/2020 showed a white blood cell count of 20.6 with a mild drop in hemoglobin to 11.5 and platelets normalized at 383.  Due to concern for this leukocytosis the patient was referred to hematology for further evaluation and management.  On exam today [name] that she has had a rough year.  She reports that she was on prednisone therapy through most of the month of February due to flareup of what she referred to as bronchitis.  She notes  that she has been having excessive sweating and that the Imuran medication she is taking has also been causing nausea.  The patient reports that she has a dry cough, but there is some productivity to the cough early in the morning.  She notes that she has not had any frank fevers, chills, runny nose, or sore throat.  She reports that she has not had any issues with rash or GI upset including diarrhea or vomiting.   On further discussion she notes that she is a never smoker and never drinker, but that she does have shortness of breath with her interstitial lung disease.  She notes that her energy is "okay" and that she sleeps quite a lot.  Per her husband he reports that she can sleep up to 14 hours/day.  A full 10 point ROS is listed below.  MEDICAL HISTORY:  Past Medical History:  Diagnosis Date  . Anxiety   . Bronchitis   . Fibromyalgia   . Hypertension    PMH: 2010  . Hypothyroidism   . Seasonal allergies   . Spinal stenosis    L-5 to S-1  . Thyroid disease   . Wears glasses     SURGICAL HISTORY: Past Surgical History:  Procedure Laterality Date  . ANTERIOR FUSION CERVICAL SPINE    . BRONCHIAL WASHINGS  02/26/2020   Procedure: BRONCHIAL WASHINGS;  Surgeon: MaMarshell GarfinkelMD;  Location: [facility];  Service: Cardiopulmonary;;  . CHOLECYSTECTOMY    . FRACTURE SURGERY     right great toe  . LAPAROSCOPIC GASTRIC BAND REMOVAL  WITH LAPAROSCOPIC GASTRIC SLEEVE RESECTION    . LUMBAR LAMINECTOMY/DECOMPRESSION MICRODISCECTOMY N/A 04/19/2016   Procedure: LUMBAR DECOMPRESSION DISCECTOMY L5 - S1;  Surgeon: Melina Schools, MD;  Location: [facility];  Service: Orthopedics;  Laterality: N/A;  . VIDEO BRONCHOSCOPY N/A 02/26/2020   Procedure: VIDEO BRONCHOSCOPY WITHOUT FLUORO;  Surgeon: Marshell Garfinkel, MD;  Location: [facility];  Service: Cardiopulmonary;  Laterality: N/A;    SOCIAL HISTORY: Social History   Socioeconomic History  . Marital status: Married    Spouse name: Not on file  .  Number of children: Not on file  . Years of education: Not on file  . Highest education level: Not on file  Occupational History  . Not on file  Tobacco Use  . Smoking status: Never Smoker  . Smokeless tobacco: Never Used  Substance and Sexual Activity  . Alcohol use: No  . Drug use: No  . Sexual activity: Not on file  Other Topics Concern  . Not on file  Social History Narrative  . Not on file   Social Determinants of Health   Financial Resource Strain:   . Difficulty of Paying Living Expenses:   Food Insecurity:   . Worried About Charity fundraiser in the Last Year:   . Arboriculturist in the Last Year:   Transportation Needs:   . Film/video editor (Medical):   Marland Kitchen Lack of Transportation (Non-Medical):   Physical Activity:   . Days of Exercise per Week:   . Minutes of Exercise per Session:   Stress:   . Feeling of Stress :   Social Connections:   . Frequency of Communication with Friends and Family:   . Frequency of Social Gatherings with Friends and Family:   . Attends Religious Services:   . Active Member of Clubs or Organizations:   . Attends Archivist Meetings:   Marland Kitchen Marital Status:   Intimate Partner Violence:   . Fear of Current or Ex-Partner:   . Emotionally Abused:   Marland Kitchen Physically Abused:   . Sexually Abused:     FAMILY HISTORY: Family History  Problem Relation Age of Onset  . Diabetes Sister   . Heart disease Other   . Cancer Other     ALLERGIES:  is allergic to penicillins and doxycycline.  MEDICATIONS:  Current Outpatient Medications  Medication Sig Dispense Refill  . albuterol (PROVENTIL) (2.5 MG/3ML) 0.083% nebulizer solution Take 2.5 mg by nebulization every 4 (four) hours as needed for wheezing or shortness of breath.     . Ascorbic Acid (VITAMIN C) 100 MG tablet Take 100 mg by mouth daily.    Marland Kitchen aspirin EC 81 MG tablet Take 81 mg by mouth daily.    Marland Kitchen atorvastatin (LIPITOR) 80 MG tablet Take 80 mg by mouth daily.     Marland Kitchen  azaTHIOprine (IMURAN) 50 MG tablet Take 1 tablet (50 mg total) by mouth daily. 30 tablet 2  . budesonide-formoterol (SYMBICORT) 80-4.5 MCG/ACT inhaler Take 2 puffs first thing in am and then another 2 puffs about 12 hours later. (Patient taking differently: Inhale 2 puffs into the lungs in the morning and at bedtime. ) 1 Inhaler 11  . Calcium 250 MG CAPS Take 500 mg by mouth 2 (two) times daily.    . citalopram (CELEXA) 40 MG tablet Take 40 mg by mouth at bedtime.     . clopidogrel (PLAVIX) 75 MG tablet Take 75 mg by mouth daily.    . famotidine (PEPCID) 20 MG  tablet One after supper (Patient taking differently: Take 20 mg by mouth every evening. One after supper) 30 tablet 11  . hydrOXYzine (ATARAX/VISTARIL) 25 MG tablet Take 25 mg by mouth at bedtime.     Marland Kitchen ibuprofen (ADVIL) 800 MG tablet Take 800 mg by mouth daily as needed for mild pain or moderate pain.     Marland Kitchen levothyroxine (SYNTHROID, LEVOTHROID) 125 MCG tablet Take 125 mcg by mouth daily before breakfast.    . loratadine-pseudoephedrine (CLARITIN-D 24-HOUR) 10-240 MG 24 hr tablet Take 1 tablet by mouth daily.    . Magnesium 250 MG TABS Take 1 tablet by mouth at bedtime.    . metoprolol succinate (TOPROL-XL) 25 MG 24 hr tablet Take 25 mg by mouth daily.     . montelukast (SINGULAIR) 10 MG tablet Take 10 mg by mouth daily.     Marland Kitchen morphine (MSIR) 15 MG tablet Take 15 mg by mouth 3 (three) times daily as needed for moderate pain or severe pain.     . Multiple Vitamin (MULTIVITAMIN WITH MINERALS) TABS tablet Take 1 tablet by mouth daily.    . nitroGLYCERIN (NITROSTAT) 0.4 MG SL tablet Place 0.4 mg under the tongue every 5 (five) minutes as needed for chest pain. AS NEEDED    . ondansetron (ZOFRAN ODT) 4 MG disintegrating tablet Take 1 tablet (4 mg total) by mouth every 8 (eight) hours as needed for nausea or vomiting. 20 tablet 0  . pantoprazole (PROTONIX) 40 MG tablet Take 1 tablet (40 mg total) by mouth daily. Take 30-60 min before first meal of  the day 30 tablet 2  . pimecrolimus (ELIDEL) 1 % cream Apply 1 application topically 2 (two) times daily.    Marland Kitchen sulfamethoxazole-trimethoprim (BACTRIM DS) 800-160 MG tablet Take 1 tablet by mouth 3 (three) times a week. 12 tablet 3  . tiZANidine (ZANAFLEX) 4 MG capsule Take 1 capsule (4 mg total) by mouth 3 (three) times daily. (Patient taking differently: Take 4 mg by mouth 3 (three) times daily as needed. ) 30 capsule 0  . VITAMIN D, CHOLECALCIFEROL, PO Take 1 tablet by mouth daily.     No current facility-administered medications for this visit.    REVIEW OF SYSTEMS:   Constitutional: ( - ) fevers, ( - )  chills , ( + ) sweats Eyes: ( - ) blurriness of vision, ( - ) double vision, ( - ) watery eyes Ears, nose, mouth, throat, and face: ( - ) mucositis, ( - ) sore throat Respiratory: ( + ) cough, ( - ) dyspnea, ( - ) wheezes Cardiovascular: ( - ) palpitation, ( - ) chest discomfort, ( - ) lower extremity swelling Gastrointestinal:  ( - ) nausea, ( - ) heartburn, ( - ) change in bowel habits Skin: ( - ) abnormal skin rashes Lymphatics: ( - ) new lymphadenopathy, ( - ) easy bruising Neurological: ( - ) numbness, ( - ) tingling, ( - ) new weaknesses Behavioral/Psych: ( - ) mood change, ( - ) new changes  All other systems were reviewed with the patient and are negative.  PHYSICAL EXAMINATION: ECOG PERFORMANCE STATUS: 1 - Symptomatic but completely ambulatory  Vitals:   03/26/20 1301  BP: 128/79  Pulse: 84  Resp: 18  Temp: 98.2 F (36.8 C)  SpO2: 97%   Filed Weights   03/26/20 1301  Weight: 183 lb 6.4 oz (83.2 kg)    GENERAL: well appearing elderly Caucasian female in NAD  SKIN: skin color, texture, turgor  are normal, no rashes or significant lesions EYES: conjunctiva are pink and non-injected, sclera clear NECK: supple, non-tender LYMPH:  no palpable lymphadenopathy in the cervical, axillary or inguinal LUNGS: clear to auscultation and percussion with normal breathing  effort HEART: regular rate & rhythm and no murmurs and no lower extremity edema ABDOMEN: soft, non-tender, non-distended, normal bowel sounds. No HSM.  Musculoskeletal: no cyanosis of digits and no clubbing  PSYCH: alert & oriented x 3, fluent speech NEURO: no focal motor/sensory deficits  LABORATORY DATA:  I have reviewed the data as listed CBC Latest Ref Rng & Units 02/06/2020 01/10/2020 01/09/2020  WBC 4.0 - 10.5 K/uL 20.6 Repeated and verified X2.([place]) 24.2(H) 20.1 Repeated and verified X2.(HH)  Hemoglobin 12.0 - 15.0 g/dL 11.5(L) 11.8(L) 11.7(L)  Hematocrit 36.0 - 46.0 % 35.7(L) 35.9(L) 35.6(L)  Platelets 150.0 - 400.0 K/uL 383.0 463(H) 493.0(H)    CMP Latest Ref Rng & Units 02/06/2020 01/10/2020 05/29/2019  Glucose 70 - 99 mg/dL 84 107(H) 89  BUN 6 - 23 mg/dL _0 Creatinine 0.40 - 1.20 mg/dL 0.88 0.80 0.97  Sodium 135 - 145 mEq/L 138 140 138  Potassium 3.5 - 5.1 mEq/L 3.8 3.0(L) 3.9  Chloride 96 - 112 mEq/L 106 107 104  CO2 19 - 32 mEq/L _1 Calcium 8.4 - 10.5 mg/dL 8.6 8.8(L) 8.9  Total Protein 6.0 - 8.3 g/dL 5.8(L) - -  Total Bilirubin 0.2 - 1.2 mg/dL 0.4 - -  Alkaline Phos 39 - 117 U/L 53 - -  AST 0 - 37 U/L 20 - -  ALT 0 - 35 U/L 18 - -     PATHOLOGY: None relevant to review.   BLOOD FILM:  Review of the peripheral blood smear showed normal appearing white cells with neutrophils that were increased in number, but otherwise appropriately lobated and granulated. There was no predominance of bi-lobed or hyper-segmented neutrophils appreciated. No Dohle bodies were noted. There was no left shifting, immature forms or blasts noted. Lymphocytes remain normal in size without any predominance of large granular lymphocytes. Red cells show no anisopoikilocytosis, macrocytes , microcytes or polychromasia. There were no schistocytes, target cells, echinocytes, acanthocytes, dacrocytes, or stomatocytes.There was no rouleaux formation, nucleated red cells, or intra-cellular  inclusions noted. The platelets are normal in size, shape, and color without any clumping evident.  RADIOGRAPHIC STUDIES:  DG CHEST PORT 1 VIEW  Result Date: 02/26/2020 CLINICAL DATA:  S/p bronchoscopy with bronchoalveolar live eyes. EXAM: PORTABLE CHEST 1 VIEW COMPARISON:  01/10/2020 FINDINGS: Cardiac silhouette normal in size.  No mediastinal or hilar masses. Vascular prominence with mild interstitial thickening and ill-defined reticular opacities are stable from the prior exam, consistent with interstitial fibrotic lung disease, as noted on a chest CT dated 02/05/2020. No focal consolidation to suggest pneumonia. No pulmonary edema. No pleural effusion or pneumothorax. Skeletal structures are grossly intact. IMPRESSION: 1. No acute findings. No pneumothorax or evidence of a complication following bronchoscopy. Electronically Signed   By: Lajean Manes M.D.   On: 02/26/2020 14:42    ASSESSMENT & PLAN KRISTYANNA BARCELO 69 y.o. female with medical history significant for hypothyroidism, anxiety, fibromyalgia, ILD and an unspecified autoimmune disease who presents for evaluation of leukocytosis.  After review of the labs and discussion with the patient her findings are most consistent with a reactive leukocytosis secondary to her chronic inflammation as well as a localized increase in the white blood cell count with administration of steroid therapy.  These findings are supported  by the neutrophilic predominance as well as the perfect timing of the increase of white blood cells in the 20s with administration of prednisone therapy.  With her active interstitial lung disease and Sjogren's syndrome is likely the patient will remain with an elevated white blood cell count until these conditions are under better control.  Review of the peripheral blood film is reassuring that there is no underlying hematological malignancy.  At this time I do not think routine follow-up in our clinic is required, however if the  patient were to develop new cytopenias or other hematologic abnormalities we would happily see her back in our clinic to reassess.  # Leukocytosis with Neutrophilic Predominance -- findings are most consistent with inflammation from the patient's ILD and autoimmune disorder, as well as a marked spike while she was receiving steroid therapy. This is supported by the neutrophilic predominance, the recent downward trend, and the likely reactive increase in platelets  -- no lymphadenopathy noted on CT imaging of the chest or on physical exam.  -- will confirm this by ordering ESR, CRP and reviewing peripheral blood film  -- additionally will order CBC, TSH, and CMP --cancer screenings up to date including Mammogram in July 2020 and colonoscopy in Jan 2021 -- no clear indication for a bone marrow biopsy or MPN workup at this time -- no further f/u required in our clinic at this time. Please re-refer if the patient were to develop worsening cytopenias or significant increases in WBC.   No orders of the defined types were placed in this encounter.   All questions were answered. The patient knows to call the clinic with any problems, questions or concerns.  A total of more than 60 minutes were spent on this encounter and over half of that time was spent on counseling and coordination of care as outlined above.   Ledell Peoples, MD Department of Hematology/Oncology [place] at [facility] Phone: [phone] Pager: [phone] Email: [email or web address]  03/26/2020 1:06 PM

## 2020-03-29 LAB — TSH: TSH: 3.305 u[IU]/mL (ref 0.308–3.960)

## 2020-03-30 ENCOUNTER — Encounter: Payer: Self-pay | Admitting: Hematology and Oncology

## 2020-03-31 ENCOUNTER — Telehealth: Payer: Self-pay | Admitting: *Deleted

## 2020-03-31 NOTE — Telephone Encounter (Signed)
-----  Message from Orson Slick, MD sent at 03/30/2020  2:38 PM EDT ----- Please let Mrs. Helmuth know that her bloodwork showed no concerning abnormalities. Her elevation in WBC is most likely due to her Sjogren's syndrome and ILD. There is no need for routine f/u in our clinic unless her blood counts were to change.  Colan Neptune  ----- Message ----- From: Buel Ream, Lab In [place] Sent: 03/26/2020   2:40 PM EDT To: Orson Slick, MD

## 2020-03-31 NOTE — Telephone Encounter (Signed)
TCT patient regarding lab results from 03/26/20. Spoke with her and informed her that her lab work did not reveal any concerning abnormalities.  Per Dr. Lorenso Courier, her elevated WBCs are likely due to her Sjogren's Syndrome and her Interstitial lung disease.  Advised that she would not need to follow up in our clinic unless there was a change in her blood counts.  Pt voiced understanding.

## 2020-04-05 ENCOUNTER — Telehealth: Payer: Self-pay | Admitting: Pharmacist

## 2020-04-05 DIAGNOSIS — J849 Interstitial pulmonary disease, unspecified: Secondary | ICD-10-CM

## 2020-04-05 DIAGNOSIS — Z5181 Encounter for therapeutic drug level monitoring: Secondary | ICD-10-CM

## 2020-04-05 MED ORDER — ONDANSETRON 4 MG PO TBDP: 4.0000 mg | ORAL_TABLET | Freq: Three times a day (TID) | ORAL | 0 refills | Status: DC | PRN

## 2020-04-05 NOTE — Telephone Encounter (Signed)
Called to discuss lab results and increasing dose of Imuran.  Patient states she is started to experience some nausea and vomiting while taking Imuran.  She states she has been taking in the evening with a small snack.  She is requesting a refill of Zofran.  Recommend patient to continue 50 mg once daily at this time.  Encourage patient to take with dinner in order to try and minimize GI upset.  Refill of Zofran sent to the pharmacy.  We will follow-up with patient in 1 week to see if side effects have improved.  All questions encouraged and answered.  Instructed patient to call with any further questions or concerns.  Mariella Saa, PharmD, [place], [place] Clinical Specialty Pharmacist [phone]  04/05/2020 1:41 PM

## 2020-04-12 LAB — FUNGUS CULTURE WITH STAIN

## 2020-04-12 LAB — FUNGAL ORGANISM REFLEX

## 2020-04-12 LAB — FUNGUS CULTURE RESULT

## 2020-04-14 DIAGNOSIS — M3500 Sicca syndrome, unspecified: Secondary | ICD-10-CM | POA: Diagnosis not present

## 2020-04-14 DIAGNOSIS — G894 Chronic pain syndrome: Secondary | ICD-10-CM | POA: Diagnosis not present

## 2020-04-14 DIAGNOSIS — E669 Obesity, unspecified: Secondary | ICD-10-CM | POA: Diagnosis not present

## 2020-04-14 DIAGNOSIS — I272 Pulmonary hypertension, unspecified: Secondary | ICD-10-CM | POA: Diagnosis not present

## 2020-04-14 DIAGNOSIS — J849 Interstitial pulmonary disease, unspecified: Secondary | ICD-10-CM | POA: Diagnosis not present

## 2020-04-14 DIAGNOSIS — D72829 Elevated white blood cell count, unspecified: Secondary | ICD-10-CM | POA: Diagnosis not present

## 2020-04-14 DIAGNOSIS — Z6830 Body mass index (BMI) 30.0-30.9, adult: Secondary | ICD-10-CM | POA: Diagnosis not present

## 2020-04-14 MED ORDER — AZATHIOPRINE 50 MG PO TABS: 100.0000 mg | ORAL_TABLET | Freq: Every day | ORAL | 2 refills | Status: DC

## 2020-04-14 NOTE — Telephone Encounter (Signed)
Patient called to to update about Imuran side effects.  Patient states she tried a recommendation of taking with dinner which greatly improved GI upset.  She states she just had an appointment with the rheumatologist who encouraged increasing her Imuran to 100 mg as her GI upset has subsided.  New prescription for Imuran 100 mg daily sent to pharmacy.  She will be due for repeat CBC/CMP in 2 to 4 weeks.  Future orders placed.   Mariella Saa, PharmD, [place], [place] Clinical Specialty Pharmacist [phone]  04/14/2020 3:03 PM

## 2020-04-19 ENCOUNTER — Other Ambulatory Visit: Payer: Self-pay

## 2020-04-19 ENCOUNTER — Ambulatory Visit: Payer: Medicare PPO | Admitting: Pulmonary Disease

## 2020-04-19 ENCOUNTER — Encounter: Payer: Self-pay | Admitting: Pulmonary Disease

## 2020-04-19 VITALS — BP 130/82 | HR 93 | Temp 97.7°F | Ht 65.0 in | Wt 178.6 lb

## 2020-04-19 DIAGNOSIS — J849 Interstitial pulmonary disease, unspecified: Secondary | ICD-10-CM | POA: Diagnosis not present

## 2020-04-19 NOTE — Progress Notes (Signed)
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  Followed with Dr. Amil Amen and Marella Chimes at [facility] rheumatology for Sjogren's syndrome with positive ANA, SSA, sicca syndrome and positive rheumatoid factor. 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  History also notable for coronary artery disease with stent placement at [facility].  Echocardiogram at [facility] last year shows moderate pulmonary hypertension which improved on follow up echo.  Evaluated in end of Jan 2021 for elevated WBC count.  SARS-CoV-2 test was negative.  CT with changes of pneumonia and she was given doxycycline in the ED High-res CT scan does not show ongoing pneumonia but has fibrosis and probable UIP pattern  Underwent bronchoscopy on 02/26/2020 to rule out infection given elevated WBC count Given prednisone taper and started on azathioprine 50 mg and Bactrim on 03/09/20 Has also been evaluated by oncology on 03/26/2020 for elevated WBC count thought to be secondary to inflammation secondary to autoimmune disease.  Pets: No pets Occupation: Retired Recruitment consultant Exposures: No known exposures.  No mold, hot tub, Jacuzzi.  No down pillows or comforter Smoking history: Never smoked Travel history: No significant travel history Relevant family history: No significant family history of lung  Interim History: Dyspnea is improved. Tolerating Imuran since she was given instruction to take it mid  meal and takes Zofran PRN.  Imuran increased to 100 mg on 04/14/2020 Continues to have some hoarseness, but improving.  Feels she is 60-70% back to her normal.    Not on prednisone at present.  Outpatient Encounter Medications as of 04/19/2020  Medication Sig  . albuterol (PROVENTIL) (2.5 MG/3ML) 0.083% nebulizer solution Take 2.5 mg by nebulization every 4 (four) hours as needed for wheezing or shortness of breath.   . Ascorbic Acid (VITAMIN C) 100 MG tablet Take 100 mg by mouth daily.  Marland Kitchen aspirin EC 81 MG tablet Take 81 mg by mouth daily.  Marland Kitchen atorvastatin (LIPITOR) 80 MG tablet Take 80 mg by mouth daily.   Marland Kitchen azaTHIOprine (IMURAN) 50 MG tablet Take 2 tablets (100 mg total) by mouth daily.  . budesonide-formoterol (SYMBICORT) 80-4.5 MCG/ACT inhaler Take 2 puffs first thing in am and then another 2 puffs about 12 hours later. (Patient taking differently: Inhale 2 puffs into the lungs in the morning and at bedtime. )  . citalopram (CELEXA) 40 MG tablet Take 40 mg by mouth at bedtime.   . clopidogrel (PLAVIX) 75 MG tablet Take 75 mg by mouth daily.  . famotidine (PEPCID) 20 MG tablet One after supper (Patient taking differently: Take 20 mg by mouth every evening. One after supper)  . ibuprofen (ADVIL) 800 MG tablet Take 800 mg by mouth daily as needed for mild pain or moderate pain.   Marland Kitchen levothyroxine (SYNTHROID, LEVOTHROID) 125 MCG tablet Take 125 mcg by mouth daily before breakfast.  . loratadine-pseudoephedrine (CLARITIN-D 24-HOUR) 10-240 MG 24 hr tablet Take 1 tablet by mouth daily.  Marland Kitchen  Magnesium 250 MG TABS Take 1 tablet by mouth at bedtime.  . metoprolol succinate (TOPROL-XL) 25 MG 24 hr tablet Take 25 mg by mouth daily.   . montelukast (SINGULAIR) 10 MG tablet Take 10 mg by mouth daily.   Marland Kitchen morphine (MSIR) 15 MG tablet Take 15 mg by mouth 3 (three) times daily as needed for moderate pain or severe pain.   . Multiple Vitamin (MULTIVITAMIN WITH MINERALS) TABS tablet Take 1 tablet by mouth daily.   . nitroGLYCERIN (NITROSTAT) 0.4 MG SL tablet Place 0.4 mg under the tongue every 5 (five) minutes as needed for chest pain. AS NEEDED  . ondansetron (ZOFRAN ODT) 4 MG disintegrating tablet Take 1 tablet (4 mg total) by mouth every 8 (eight) hours as needed for nausea or vomiting.  . pantoprazole (PROTONIX) 40 MG tablet Take 1 tablet (40 mg total) by mouth daily. Take 30-60 min before first meal of the day  . pimecrolimus (ELIDEL) 1 % cream Apply 1 application topically 2 (two) times daily.  Marland Kitchen tiZANidine (ZANAFLEX) 4 MG capsule Take 1 capsule (4 mg total) by mouth 3 (three) times daily. (Patient taking differently: Take 4 mg by mouth 3 (three) times daily as needed. )  . Calcium 250 MG CAPS Take 500 mg by mouth 2 (two) times daily.  . hydrOXYzine (ATARAX/VISTARIL) 25 MG tablet Take 25 mg by mouth at bedtime.   . sulfamethoxazole-trimethoprim (BACTRIM DS) 800-160 MG tablet Take 1 tablet by mouth 3 (three) times a week. (Patient not taking: Reported on 04/19/2020)  . VITAMIN D, CHOLECALCIFEROL, PO Take 1 tablet by mouth daily.   No facility-administered encounter medications on file as of 04/19/2020.   Physical Exam: Blood pressure 130/82, pulse 93, temperature 97.7 F (36.5 C), temperature source Temporal, height _0  (1.651 m), weight 178 lb 9.6 oz (81 kg), SpO2 95 %. Gen:      No acute distress HEENT:  EOMI, sclera anicteric Neck:     No masses; no thyromegaly Lungs:    Clear to auscultation bilaterally; normal respiratory effort CV:         Regular rate and rhythm; no murmurs Abd:      + bowel sounds; soft, non-tender; no palpable masses, no distension Ext:    No edema; adequate peripheral perfusion Skin:      Warm and dry; no rash Neuro: alert and oriented x 3 Psych: normal mood and affect  Data Reviewed: Imaging: CT high-resolution 07/24/2019-patchy groundglass with septal thickening, reticulation, bronchiectasis with no honeycombing.  Basal gradient noted.  Probable UIP pattern  CTA  01/10/2020-No PE, mild bibasal consolidation.  Chronic ILD.  CT high-resolution 02/05/2020-pulmonary fibrosis and probable UIP pattern without progression since 2020. I have reviewed the images personally.  PFTs:  03/09/2020 FVC 1.74 (54%), FEV1 1.51 [61%],/57, TLC 2.75 [52%], DLCO 8.44 [41%] Severe restriction and diffusion defect.  6-minute walk test 02/06/2020-374 m, nadir O2 sat 94%  Labs: CBC 05/29/2019-WBC 13.2, eos 6.9%, absolute eosinophil count 911 CBC 03/26/2020 - WBC 14.8 eos 8%, absolute eosinophil count 1184  ANA 05/29/2019- > 1:1280 Repeat CTD serologies 01/09/2020-ANA 1 is to 1280, rheumatoid factor 14, positive SSA  SARS-CoV-2 01/10/2020- negative  Bronchoscopy 02/26/2020- Cell count 113, 42% lymphs Microbiology, cytology -negative  Cardiac: Echocardiogram ([facility]) 04/18/2019 Normal LV size, LVEF 79-89%, grade 1 diastolic dysfunction.  RVSP of 40-50 consistent with moderate pulmonary  Echocardiogram 01/12/2020 Normal LVEF 21-19%, grade 1 diastolic dysfunction, RVSP 35  Assessment:  CTD related interstitial lung disease, pulmonary fibrosis Sjogren's  syndrome Recently started on Imuran. Now on 100 mg dose for one week. She initially had nausea with Imuran, but improved with eating and taking medication mid meal.    Leukocytosis Seen by hematology after last visit and I reviewed notes. Leukocytosis consistent with ILD , autoimmune disorder , and prednisone therapy.    Pulmonary hypertension Related to cardiomyopathy, coronary artery disease, diastolic dysfunction versus pulmonary hypertension from connective tissue disease Repeat echocardiogram reviewed at last visit shows improvement in LVEF and reduction in RVSP.   Continue monitoring  Plan/Recommendations: - Continue Imuran, continue Bactrim 3 times daily - Spirometry and diffusion capacity - follow up CBC - Follow up in 3 months    Tamsen Snider, MD PGY1   Attending note: I have seen and examined the  patient. History, labs and imaging reviewed. Agree with assessment and plan  Imuran now at 100 mg.  She continues on Bactrim Reports improvement in symptoms of dyspnea Check CBC, CMP in 2 weeks for monitoring Follow-up spirometry and diffusion capacity. We may consider increase of imuran to 150 if there is any progression or persistent joint symptoms  Marshell Garfinkel MD [facility] 04/19/2020, 4:03 PM  CC: Lowella Dandy, NP

## 2020-04-19 NOTE — Patient Instructions (Addendum)
I am glad you continue to improve on Imuran. We will follow up on blood work already ordered.This is followed when you are taking Imuran. In addition we have ordered limited lung function test to be done before your next visit in 3 months.  - Spirometry and diffusion capacity - Follow up in 3 months

## 2020-04-22 ENCOUNTER — Other Ambulatory Visit: Payer: Self-pay | Admitting: *Deleted

## 2020-04-22 MED ORDER — PANTOPRAZOLE SODIUM 40 MG PO TBEC: 40.0000 mg | DELAYED_RELEASE_TABLET | Freq: Every day | ORAL | 2 refills | Status: DC

## 2020-05-05 DIAGNOSIS — L255 Unspecified contact dermatitis due to plants, except food: Secondary | ICD-10-CM | POA: Diagnosis not present

## 2020-05-06 DIAGNOSIS — M961 Postlaminectomy syndrome, not elsewhere classified: Secondary | ICD-10-CM | POA: Diagnosis not present

## 2020-05-20 DIAGNOSIS — I2510 Atherosclerotic heart disease of native coronary artery without angina pectoris: Secondary | ICD-10-CM | POA: Diagnosis not present

## 2020-05-20 DIAGNOSIS — Z955 Presence of coronary angioplasty implant and graft: Secondary | ICD-10-CM | POA: Diagnosis not present

## 2020-05-20 DIAGNOSIS — M3502 Sicca syndrome with lung involvement: Secondary | ICD-10-CM | POA: Diagnosis not present

## 2020-05-20 DIAGNOSIS — E782 Mixed hyperlipidemia: Secondary | ICD-10-CM | POA: Diagnosis not present

## 2020-05-21 ENCOUNTER — Other Ambulatory Visit: Payer: Self-pay | Admitting: *Deleted

## 2020-05-21 DIAGNOSIS — J849 Interstitial pulmonary disease, unspecified: Secondary | ICD-10-CM

## 2020-05-21 MED ORDER — AZATHIOPRINE 50 MG PO TABS: 100.0000 mg | ORAL_TABLET | Freq: Every day | ORAL | 2 refills | Status: DC

## 2020-05-21 MED ORDER — FAMOTIDINE 20 MG PO TABS: ORAL_TABLET | ORAL | 11 refills | Status: DC

## 2020-05-25 DIAGNOSIS — M961 Postlaminectomy syndrome, not elsewhere classified: Secondary | ICD-10-CM | POA: Diagnosis not present

## 2020-05-28 DIAGNOSIS — I10 Essential (primary) hypertension: Secondary | ICD-10-CM | POA: Diagnosis not present

## 2020-05-28 DIAGNOSIS — E785 Hyperlipidemia, unspecified: Secondary | ICD-10-CM | POA: Diagnosis not present

## 2020-05-28 DIAGNOSIS — I2510 Atherosclerotic heart disease of native coronary artery without angina pectoris: Secondary | ICD-10-CM | POA: Diagnosis not present

## 2020-05-28 DIAGNOSIS — M1990 Unspecified osteoarthritis, unspecified site: Secondary | ICD-10-CM | POA: Diagnosis not present

## 2020-05-28 DIAGNOSIS — J849 Interstitial pulmonary disease, unspecified: Secondary | ICD-10-CM | POA: Diagnosis not present

## 2020-05-28 DIAGNOSIS — M3502 Sicca syndrome with lung involvement: Secondary | ICD-10-CM | POA: Diagnosis not present

## 2020-05-28 DIAGNOSIS — F418 Other specified anxiety disorders: Secondary | ICD-10-CM | POA: Diagnosis not present

## 2020-05-28 DIAGNOSIS — Z6830 Body mass index (BMI) 30.0-30.9, adult: Secondary | ICD-10-CM | POA: Diagnosis not present

## 2020-05-28 DIAGNOSIS — E039 Hypothyroidism, unspecified: Secondary | ICD-10-CM | POA: Diagnosis not present

## 2020-07-06 DIAGNOSIS — M5136 Other intervertebral disc degeneration, lumbar region: Secondary | ICD-10-CM | POA: Diagnosis not present

## 2020-07-06 DIAGNOSIS — M961 Postlaminectomy syndrome, not elsewhere classified: Secondary | ICD-10-CM | POA: Diagnosis not present

## 2020-07-07 ENCOUNTER — Encounter (HOSPITAL_COMMUNITY): Payer: Self-pay

## 2020-07-07 NOTE — Progress Notes (Signed)
Sent a message in [place] regarding the covid drive- thru location change.

## 2020-07-08 ENCOUNTER — Other Ambulatory Visit: Payer: Self-pay | Admitting: *Deleted

## 2020-07-08 DIAGNOSIS — J849 Interstitial pulmonary disease, unspecified: Secondary | ICD-10-CM

## 2020-07-08 MED ORDER — SULFAMETHOXAZOLE-TRIMETHOPRIM 800-160 MG PO TABS: 1.0000 | ORAL_TABLET | ORAL | 3 refills | Status: DC

## 2020-07-15 ENCOUNTER — Other Ambulatory Visit: Payer: Self-pay | Admitting: Pulmonary Disease

## 2020-07-19 DIAGNOSIS — M3500 Sicca syndrome, unspecified: Secondary | ICD-10-CM | POA: Diagnosis not present

## 2020-07-19 DIAGNOSIS — G894 Chronic pain syndrome: Secondary | ICD-10-CM | POA: Diagnosis not present

## 2020-07-19 DIAGNOSIS — Z6828 Body mass index (BMI) 28.0-28.9, adult: Secondary | ICD-10-CM | POA: Diagnosis not present

## 2020-07-19 DIAGNOSIS — M797 Fibromyalgia: Secondary | ICD-10-CM | POA: Diagnosis not present

## 2020-07-19 DIAGNOSIS — I272 Pulmonary hypertension, unspecified: Secondary | ICD-10-CM | POA: Diagnosis not present

## 2020-07-19 DIAGNOSIS — E663 Overweight: Secondary | ICD-10-CM | POA: Diagnosis not present

## 2020-07-19 DIAGNOSIS — J849 Interstitial pulmonary disease, unspecified: Secondary | ICD-10-CM | POA: Diagnosis not present

## 2020-07-19 DIAGNOSIS — D72829 Elevated white blood cell count, unspecified: Secondary | ICD-10-CM | POA: Diagnosis not present

## 2020-07-30 DIAGNOSIS — Z1231 Encounter for screening mammogram for malignant neoplasm of breast: Secondary | ICD-10-CM | POA: Diagnosis not present

## 2020-07-30 DIAGNOSIS — N959 Unspecified menopausal and perimenopausal disorder: Secondary | ICD-10-CM | POA: Diagnosis not present

## 2020-07-31 ENCOUNTER — Other Ambulatory Visit (HOSPITAL_COMMUNITY)
Admission: RE | Admit: 2020-07-31 | Discharge: 2020-07-31 | Disposition: A | Discharge status: Home / Self Care | Payer: Medicare PPO | Source: Ambulatory Visit | Attending: Pulmonary Disease | Admitting: Pulmonary Disease

## 2020-07-31 DIAGNOSIS — Z01812 Encounter for preprocedural laboratory examination: Secondary | ICD-10-CM | POA: Diagnosis not present

## 2020-07-31 DIAGNOSIS — Z20822 Contact with and (suspected) exposure to covid-19: Secondary | ICD-10-CM | POA: Insufficient documentation

## 2020-07-31 LAB — SARS CORONAVIRUS 2 (TAT 6-24 HRS): SARS Coronavirus 2: NEGATIVE

## 2020-08-03 ENCOUNTER — Other Ambulatory Visit: Payer: Self-pay

## 2020-08-03 ENCOUNTER — Encounter: Payer: Self-pay | Admitting: Pulmonary Disease

## 2020-08-03 ENCOUNTER — Ambulatory Visit: Payer: Medicare PPO | Admitting: Pulmonary Disease

## 2020-08-03 ENCOUNTER — Ambulatory Visit (INDEPENDENT_AMBULATORY_CARE_PROVIDER_SITE_OTHER): Payer: Medicare PPO | Admitting: Pulmonary Disease

## 2020-08-03 VITALS — BP 110/70 | HR 74 | Temp 97.7°F | Ht 64.0 in | Wt 176.0 lb

## 2020-08-03 DIAGNOSIS — J849 Interstitial pulmonary disease, unspecified: Secondary | ICD-10-CM | POA: Diagnosis not present

## 2020-08-03 LAB — PULMONARY FUNCTION TEST
DL/VA % pred: 89 %
DL/VA: 3.7 ml/min/mmHg/L
DLCO cor % pred: 47 %
DLCO cor: 9.52 ml/min/mmHg
DLCO unc % pred: 47 %
DLCO unc: 9.52 ml/min/mmHg
FEF 25-75 Post: 3.43 L/sec
FEF 25-75 Pre: 3.02 L/sec
FEF2575-%Change-Post: 13 %
FEF2575-%Pred-Post: 164 %
FEF2575-%Pred-Pre: 145 %
FEV1-%Change-Post: 6 %
FEV1-%Pred-Post: 66 %
FEV1-%Pred-Pre: 62 %
FEV1-Post: 1.61 L
FEV1-Pre: 1.52 L
FEV1FVC-%Change-Post: -2 %
FEV1FVC-%Pred-Pre: 124 %
FEV6-%Change-Post: 9 %
FEV6-%Pred-Post: 56 %
FEV6-%Pred-Pre: 52 %
FEV6-Post: 1.74 L
FEV6-Pre: 1.59 L
FEV6FVC-%Pred-Post: 104 %
FEV6FVC-%Pred-Pre: 104 %
FVC-%Change-Post: 9 %
FVC-%Pred-Post: 54 %
FVC-%Pred-Pre: 50 %
FVC-Post: 1.74 L
FVC-Pre: 1.59 L
Post FEV1/FVC ratio: 93 %
Post FEV6/FVC ratio: 100 %
Pre FEV1/FVC ratio: 95 %
Pre FEV6/FVC Ratio: 100 %
RV % pred: 31 %
RV: 0.68 L
TLC % pred: 53 %
TLC: 2.75 L

## 2020-08-03 NOTE — Progress Notes (Signed)
Full PFT performed today.

## 2020-08-03 NOTE — Patient Instructions (Signed)
We will get a follow-up high-resolution CT and echocardiogram in February 2022 I will check with your rheumatologist about Imuran dosing and if you need to go up on heart  Follow-up in 6 months.

## 2020-08-03 NOTE — Progress Notes (Signed)
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  Followed with Dr. Amil Amen and Marella Chimes at [facility] rheumatology for Sjogren's syndrome with positive ANA, SSA, sicca syndrome and positive rheumatoid factor. 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  History also notable for coronary artery disease with stent placement at [facility].  Echocardiogram at [facility] last year shows moderate pulmonary hypertension which improved on follow up echo.  Evaluated in end of Jan 2021 for elevated WBC count.  SARS-CoV-2 test was negative.  CT with changes of pneumonia and she was given doxycycline in the ED High-res CT scan does not show ongoing pneumonia but has fibrosis and probable UIP pattern  Underwent bronchoscopy on 02/26/2020 to rule out infection given elevated WBC count Given prednisone taper and started on azathioprine 50 mg and Bactrim on 3/21.  Azathioprine increased to 100 mg on 5/21 Has also been evaluated by oncology on 03/26/2020 for elevated WBC count thought to be secondary to inflammation secondary to autoimmune disease.  Pets: No pets  Occupation: Retired Recruitment consultant Exposures: No known exposures.  No mold, hot tub, Jacuzzi.  No down pillows or comforter Smoking history: Never smoked Travel history: No significant travel history Relevant family history: No significant family history of lung  Interim History: Tolerating Imuran 800 mg. States that dyspnea is  improved.  Continues to have significant arthritis symptoms.  She has followed up with Marella Chimes at Dr. Melissa Noon office recently.  Outpatient Encounter Medications as of 08/03/2020  Medication Sig  . albuterol (PROVENTIL) (2.5 MG/3ML) 0.083% nebulizer solution Take 2.5 mg by nebulization every 4 (four) hours as needed for wheezing or shortness of breath.   . Ascorbic Acid (VITAMIN C) 100 MG tablet Take 100 mg by mouth daily.  Marland Kitchen aspirin EC 81 MG tablet Take 81 mg by mouth daily.  Marland Kitchen atorvastatin (LIPITOR) 80 MG tablet Take 80 mg by mouth daily.   Marland Kitchen azaTHIOprine (IMURAN) 50 MG tablet Take 2 tablets (100 mg total) by mouth daily.  . budesonide-formoterol (SYMBICORT) 80-4.5 MCG/ACT inhaler Take 2 puffs first thing in am and then another 2 puffs about 12 hours later. (Patient taking differently: Inhale 2 puffs into the lungs in the morning and at bedtime. )  . Calcium 250 MG CAPS Take 500 mg by mouth 2 (two) times daily.  . citalopram (CELEXA) 40 MG tablet Take 40 mg by mouth at bedtime.   . famotidine (PEPCID) 20 MG tablet One after supper  . hydrOXYzine (ATARAX/VISTARIL) 25 MG tablet Take 25 mg by mouth at bedtime.   Marland Kitchen ibuprofen (ADVIL) 800 MG tablet Take 800 mg by mouth daily as needed for mild pain or moderate pain.   Marland Kitchen levothyroxine (SYNTHROID, LEVOTHROID) 125 MCG tablet Take 125 mcg by mouth daily before breakfast.  . loratadine-pseudoephedrine (CLARITIN-D 24-HOUR) 10-240 MG 24 hr tablet Take 1 tablet by mouth daily.  . Magnesium 250 MG TABS Take 1 tablet by mouth at bedtime.  Marland Kitchen  metoprolol succinate (TOPROL-XL) 25 MG 24 hr tablet Take 25 mg by mouth daily.   . montelukast (SINGULAIR) 10 MG tablet Take 10 mg by mouth daily.   Marland Kitchen morphine (MSIR) 15 MG tablet Take 15 mg by mouth 3 (three) times daily as needed for moderate pain or severe pain.   . Multiple Vitamin (MULTIVITAMIN WITH MINERALS) TABS tablet Take 1 tablet by mouth daily.  . nitroGLYCERIN (NITROSTAT) 0.4 MG SL tablet Place 0.4 mg  under the tongue every 5 (five) minutes as needed for chest pain. AS NEEDED  . ondansetron (ZOFRAN ODT) 4 MG disintegrating tablet Take 1 tablet (4 mg total) by mouth every 8 (eight) hours as needed for nausea or vomiting.  . pantoprazole (PROTONIX) 40 MG tablet TAKE 1 TABLET(40 MG) BY MOUTH DAILY 30 TO 60 MINUTES BEFORE FIRST MEAL OF THE DAY  . pimecrolimus (ELIDEL) 1 % cream Apply 1 application topically 2 (two) times daily.  Marland Kitchen sulfamethoxazole-trimethoprim (BACTRIM DS) 800-160 MG tablet Take 1 tablet by mouth 3 (three) times a week.  Marland Kitchen tiZANidine (ZANAFLEX) 4 MG capsule Take 1 capsule (4 mg total) by mouth 3 (three) times daily. (Patient taking differently: Take 4 mg by mouth 3 (three) times daily as needed. )  . VITAMIN D, CHOLECALCIFEROL, PO Take 1 tablet by mouth daily.  . [DISCONTINUED] clopidogrel (PLAVIX) 75 MG tablet Take 75 mg by mouth daily.   No facility-administered encounter medications on file as of 08/03/2020.   Physical Exam: Blood pressure 110/70, pulse 74, temperature 97.7 F (36.5 C), temperature source Other (Comment), height _0  (1.626 m), weight 176 lb (79.8 kg), SpO2 95 %. Gen:      No acute distress HEENT:  EOMI, sclera anicteric Neck:     No masses; no thyromegaly Lungs:    Clear to auscultation bilaterally; normal respiratory effort CV:         Regular rate and rhythm; no murmurs Abd:      + bowel sounds; soft, non-tender; no palpable masses, no distension Ext:    No edema; adequate peripheral perfusion Skin:      Warm and dry; no rash Neuro: alert and oriented x 3 Psych: normal mood and affect  Data Reviewed: Imaging: CT high-resolution 07/24/2019-patchy groundglass with septal thickening, reticulation, bronchiectasis with no honeycombing.  Basal gradient noted.  Probable UIP pattern  CTA 01/10/2020-No PE, mild bibasal consolidation.  Chronic ILD.  CT high-resolution 02/05/2020-pulmonary fibrosis and probable UIP pattern without progression since 2020. I  have reviewed the images personally.  PFTs:  03/09/2020 FVC 1.74 (54%), FEV1 1.51 [61%],/57, TLC 2.75 [52%], DLCO 8.44 [41%] Severe restriction and diffusion defect.  08/03/2020 FVC 1.74 [54%], FEV1 1.61 [6 6%], F/F 93 TLC 2.75 [53%], DLCO 9.52 [47%] Severe restriction and diffusion defect with improvement in DLCO compared to prior  6-minute walk test 02/06/2020-374 m, nadir O2 sat 94%  Labs: CBC 05/29/2019-WBC 13.2, eos 6.9%, absolute eosinophil count 911 CBC 03/26/2020 - WBC 14.8 eos 8%, absolute eosinophil count 1184  ANA 05/29/2019- > 1:1280 Repeat CTD serologies 01/09/2020-ANA 1 is to 1280, rheumatoid factor 14, positive SSA  SARS-CoV-2 01/10/2020- negative  Bronchoscopy 02/26/2020- Cell count 113, 42% lymphs Microbiology, cytology -negative  Cardiac: Echocardiogram ([facility]) 04/18/2019 Normal LV size, LVEF 03-50%, grade 1 diastolic dysfunction.  RVSP of 40-50 consistent with moderate pulmonary  Echocardiogram 01/12/2020 Normal LVEF 09-38%, grade 1 diastolic dysfunction, RVSP 35  Assessment:  CTD related interstitial lung disease, pulmonary fibrosis Sjogren's syndrome Continues on Imuran. She initially had nausea  with Imuran, but improved with eating and taking medication mid meal.    PFTs with mild improvement in diffusion capacity. She continues to have significant arthritis symptoms We will need to discuss with rheumatology if we want to increase the azathioprine dose to help with joint symptoms.  She is due to get CBC drawn at her primary care later this week.  Leukocytosis Seen by hematology after last visit and I reviewed notes. Leukocytosis consistent with ILD , autoimmune disorder , and prednisone therapy.    Pulmonary hypertension Related to cardiomyopathy, coronary artery disease, diastolic dysfunction versus pulmonary hypertension from connective tissue disease Repeat echocardiogram reviewed at last visit shows improvement in LVEF and reduction in RVSP.    Continue monitoring  Plan/Recommendations: - Continue Imuran, continue Bactrim 3 times daily - Follow-up CT, echocardiogram in February 2022  Marshell Garfinkel MD [facility] 08/03/2020, 3:30 PM  CC: Lowella Dandy, NP

## 2020-08-03 NOTE — Addendum Note (Signed)
Addended by: Merrilee Seashore on: 08/03/2020 03:59 PM   Modules accepted: Orders

## 2020-08-06 DIAGNOSIS — E039 Hypothyroidism, unspecified: Secondary | ICD-10-CM | POA: Diagnosis not present

## 2020-08-09 ENCOUNTER — Other Ambulatory Visit: Payer: Self-pay | Admitting: Pulmonary Disease

## 2020-09-22 DIAGNOSIS — M5416 Radiculopathy, lumbar region: Secondary | ICD-10-CM | POA: Diagnosis not present

## 2020-10-08 ENCOUNTER — Telehealth: Payer: Self-pay | Admitting: Pulmonary Disease

## 2020-10-08 NOTE — Telephone Encounter (Signed)
Called and spoke with pt to see if she was needing to have any meds refilled now and she stated that she did not. Stated to her when we received refill requests from [facility] for her, we would get this taken care of and she verbalized understanding. Nothing further needed.

## 2020-10-12 ENCOUNTER — Other Ambulatory Visit: Payer: Self-pay | Admitting: *Deleted

## 2020-10-12 DIAGNOSIS — J849 Interstitial pulmonary disease, unspecified: Secondary | ICD-10-CM

## 2020-10-12 MED ORDER — SULFAMETHOXAZOLE-TRIMETHOPRIM 800-160 MG PO TABS: 1.0000 | ORAL_TABLET | ORAL | 5 refills | Status: DC

## 2020-10-13 ENCOUNTER — Other Ambulatory Visit: Payer: Self-pay | Admitting: *Deleted

## 2020-10-13 DIAGNOSIS — J849 Interstitial pulmonary disease, unspecified: Secondary | ICD-10-CM

## 2020-10-13 MED ORDER — AZATHIOPRINE 50 MG PO TABS: 100.0000 mg | ORAL_TABLET | Freq: Every day | ORAL | 2 refills | Status: DC

## 2020-10-13 MED ORDER — PANTOPRAZOLE SODIUM 40 MG PO TBEC: DELAYED_RELEASE_TABLET | ORAL | 5 refills | Status: DC

## 2020-10-13 MED ORDER — FAMOTIDINE 20 MG PO TABS: ORAL_TABLET | ORAL | 5 refills | Status: DC

## 2020-10-15 ENCOUNTER — Ambulatory Visit: Payer: Medicare PPO | Admitting: Pulmonary Disease

## 2020-10-28 ENCOUNTER — Telehealth: Payer: Self-pay | Admitting: Pulmonary Disease

## 2020-10-28 MED ORDER — BUDESONIDE-FORMOTEROL FUMARATE 80-4.5 MCG/ACT IN AERO: 2.0000 | INHALATION_SPRAY | Freq: Two times a day (BID) | RESPIRATORY_TRACT | 6 refills | Status: DC

## 2020-10-28 NOTE — Addendum Note (Signed)
Addended by: Vanessa Barbara on: 10/28/2020 03:53 PM   Modules accepted: Orders

## 2020-10-28 NOTE — Telephone Encounter (Signed)
Called and spoke with patient, advised her refills for Symbicort to her mail order pharmacy.  She verbalized understanding.  Nothing further needed.

## 2020-11-22 ENCOUNTER — Other Ambulatory Visit: Payer: Self-pay

## 2020-11-22 ENCOUNTER — Ambulatory Visit: Payer: Medicare PPO | Admitting: Pulmonary Disease

## 2020-11-22 ENCOUNTER — Encounter: Payer: Self-pay | Admitting: Pulmonary Disease

## 2020-11-22 VITALS — BP 124/64 | HR 92 | Temp 97.7°F | Ht 64.5 in | Wt 167.8 lb

## 2020-11-22 DIAGNOSIS — R5381 Other malaise: Secondary | ICD-10-CM | POA: Diagnosis not present

## 2020-11-22 DIAGNOSIS — M3500 Sicca syndrome, unspecified: Secondary | ICD-10-CM

## 2020-11-22 DIAGNOSIS — J309 Allergic rhinitis, unspecified: Secondary | ICD-10-CM

## 2020-11-22 DIAGNOSIS — Z5181 Encounter for therapeutic drug level monitoring: Secondary | ICD-10-CM | POA: Insufficient documentation

## 2020-11-22 DIAGNOSIS — I272 Pulmonary hypertension, unspecified: Secondary | ICD-10-CM

## 2020-11-22 DIAGNOSIS — J9611 Chronic respiratory failure with hypoxia: Secondary | ICD-10-CM | POA: Insufficient documentation

## 2020-11-22 DIAGNOSIS — J849 Interstitial pulmonary disease, unspecified: Secondary | ICD-10-CM | POA: Diagnosis not present

## 2020-11-22 DIAGNOSIS — Z0000 Encounter for general adult medical examination without abnormal findings: Secondary | ICD-10-CM

## 2020-11-22 NOTE — Assessment & Plan Note (Signed)
Plan: Work on increasing overall physical mobility Referral to pulmonary rehab

## 2020-11-22 NOTE — Assessment & Plan Note (Signed)
Plan: Start oxygen with physical exertion-4 L We will check overnight oximetry on room air Complete echocardiogram as planned in February/2022 May need to consider additional interventions with follow-up echocardiogram such as right heart cath/[name]

## 2020-11-22 NOTE — Progress Notes (Signed)
_0  ID: Kristie [name], female    DOB: Apr 24, 1951, 69 y.o.   MRN: [id number]  Chief Complaint  Patient presents with   Follow-up    ILD    Referring provider: Lowella Dandy, NP  HPI:  69 year old female never smoker followed in our office for CT ILD  Pets: No pets  Occupation: Retired Recruitment consultant Exposures: No known exposures.  No mold, hot tub, Jacuzzi.  No down pillows or comforter Smoking history: Never smoked Travel history: No significant travel history Relevant family history: No significant family history of lung  PMH: Sjogren's syndrome, anxiety, hypertension, GERD, osteoarthritis Smoker/ Smoking History: Never smoker Maintenance: Imuran, Symbicort 80, Bactrim Monday Wednesday Friday Pt of: Dr. Vaughan Browner  11/22/2020  - Visit   69 year old female never smoker followed in our office for CT ILD.  Patient followed by Dr. Amil Amen and Marella Chimes with rheumatology for Sjogren's syndrome with positive ANA, SSA, sicca syndrome and positive rheumatoid factor.  She does have some symptoms of Raynaud's and occasional dysphagia with swallowing and arthritis in her hands.  Patient underwent a bronchoscopy in March/2021 to rule out infection given elevated WBC count.  Patient was started on prednisone taper as well as Imuran.  Elevated white blood cell count was also evaluated by hematology/oncology and was felt to be secondary to autoimmune disease.  Patient was last seen in our office in August/2021.  Plan of care at that point in time from Dr. Vaughan Browner was as follows: Continue Imuran, continue to clinically monitor patient's pulmonary hypertension, continue Bactrim Monday Wednesday Friday, plan for follow-up CT as well as echocardiogram in February/2022.  Patient presents her office today reporting she felt her breathing was fairly stable until she got her COVID-19 booster on 11/03/2020.  She felt that she was very winded and dyspneic after that.  This is slowly starting to  improve.  Patient is very physically deconditioned.  Leads a fairly sedentary lifestyle.  She is very symptomatic with minimal amounts of exertion.  There is also concerns the patient is having bouts of hypoxemia with physical exertion.  We will address and evaluate this today.  Walk in office patient required 4 L of O2 with physical exertion.  Patient reports continued follow-up with rheumatology.  She reports adherence to Imuran.  Questionaires / Pulmonary Flowsheets:   ACT:  No flowsheet data found.  MMRC: mMRC Dyspnea Scale mMRC Score  11/22/2020 3  02/06/2020 2    Epworth:  No flowsheet data found.  Tests:   02/05/2020-CT chest high-res-spectrum of findings compatible with basilar predominant fibrotic interstitial lung disease without frank honeycombing, categorized as probable UIP, stable mild cardiomegaly, stable ectatic 4.2 cm ascending thoracic aorta, aortic arthrosclerosis  02/26/2020-chest x-ray-no acute findings, no pneumothorax or evidence of complications following bronchoscopy  08/03/2020-pulmonary function test-FVC 1.59 (50% predicted), postbronchodilator ratio 93, postbronchodilator FEV1 1.61 (66% predicted), TLC 2.75 (53% predicted), DLCO 9.52 (47% predicted)  FENO:  No results found for: NITRICOXIDE  PFT: PFT Results Latest Ref Rng & Units 08/03/2020 03/09/2020  FVC-Pre L 1.59 1.75  FVC-Predicted Pre % 50 54  FVC-Post L 1.74 1.74  FVC-Predicted Post % 54 54  Pre FEV1/FVC % % 95 76  Post FEV1/FCV % % 93 87  FEV1-Pre L 1.52 1.32  FEV1-Predicted Pre % 62 53  FEV1-Post L 1.61 1.51  DLCO uncorrected ml/min/mmHg 9.52 8.44  DLCO UNC% % 47 41  DLCO corrected ml/min/mmHg 9.52 9.01  DLCO COR %Predicted % 47 44  DLVA Predicted % 89  86  TLC L 2.75 2.75  TLC % Predicted % 53 52  RV % Predicted % 31 45    WALK:  SIX MIN WALK 02/06/2020 09/18/2019 07/02/2019 05/29/2019  Medications albuterol neb sol 0.083%, atorvastatin 27m, famotidine 291m levothyroxine 12579m  metoprolol 3m39montelukast 10mg32mrphine 15mg,77mran 4mg, p69moprazole 40mg at73m5am - - -  Supplimental Oxygen during Test? (L/min) No No - No  Laps 11 - - -  Partial Lap (in Meters) 0 - - -  Baseline BP (sitting) 130/78 - - -  Baseline Heartrate 84 - - -  Baseline Dyspnea (Borg Scale) 4 - - -  Baseline Fatigue (Borg Scale) 3 - - -  Baseline SPO2 99 - - -  BP (sitting) 166/88 - - -  Heartrate 116 - - -  Dyspnea (Borg Scale) 8 - - -  Fatigue (Borg Scale) 5 - - -  SPO2 94 - - -  BP (sitting) 160/82 - - -  Heartrate 86 - - -  SPO2 97 - - -  Stopped or Paused before Six Minutes No - - -  Interpretation Hip pain;Calf pain - - -  Distance Completed 374 - - -  Tech Comments: Pt walked at an average pace completing the entire 6 min without stopping. Pt did stagger some during the walk and also coughed throughout the walk. pt walked a very slow pace for approx 20 steps and stopped 3 x due to SOB and back pain//lmr average pace/SOB//lmr average pace/SOB//lmr    Imaging: No results found.  Lab Results:  CBC    Component Value Date/Time   WBC 14.8 (H) 03/26/2020 1425   WBC 20.6 Repeated and verified X2. (HH) 02/06/2020 1134   RBC 4.61 03/26/2020 1426   RBC 4.53 03/26/2020 1425   HGB 13.2 03/26/2020 1425   HCT 39.9 03/26/2020 1425   PLT 431 (H) 03/26/2020 1425   MCV 88.1 03/26/2020 1425   MCH 29.1 03/26/2020 1425   MCHC 33.1 03/26/2020 1425   RDW 15.1 03/26/2020 1425   LYMPHSABS 2.1 03/26/2020 1425   MONOABS 1.0 03/26/2020 1425   EOSABS 1.2 (H) 03/26/2020 1425   BASOSABS 0.1 03/26/2020 1425    BMET    Component Value Date/Time   NA 136 03/26/2020 1425   K 3.8 03/26/2020 1425   CL 106 03/26/2020 1425   CO2 21 (L) 03/26/2020 1425   GLUCOSE 105 (H) 03/26/2020 1425   BUN 13 03/26/2020 1425   CREATININE 0.86 03/26/2020 1425   CALCIUM 8.9 03/26/2020 1425   GFRNONAA >60 03/26/2020 1425   GFRAA >60 03/26/2020 1425    BNP No results found for: BNP  ProBNP     Component Value Date/Time   PROBNP 139.0 (H) 05/29/2019 1118    Specialty Problems      Pulmonary Problems   Allergic rhinitis    Qualifier: Diagnosis of  By: Ellison Loanne Drillingn A       Chronic cough    Onset 2005 intermittent and evolved to chronic cough Jan 2020 on fosfamax -  D/c fosfamax and symb 160 05/29/2019 and max rx for reflux > resolved 07/02/2019       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 Jan 2020  - 05/29/2019   Walked RA  2 laps @  approx 250ft eac36ffast pace  stopped due to end of study c/o sob and sats down to 92%   - collagen  vasc profile sent 05/29/2019 >  Pos for ANA with speckled pattern 1: 1280 and RA 19  - 07/02/2019   Walked RA  2 laps @  approx 241f each @ avg pace  stopped due to  End of study with sats 91% at sob at end   - HRCT 07/24/2019 1. The appearance of the lungs remains compatible with interstitial lung disease, with a spectrum of findings considered probable usual interstitial pneumonia (UIP) per current ATS guidelines. 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 - Rheum eval 07/16/19 by EMarella ChimesPA with multiple studies sent       DOE (dyspnea on exertion)    Onset around 2015 assoc with PF   09/18/2019   Walked RA x one lap =  approx 250 ft - stopped due to  Sob/back pain with sats 91% at end at very slow pace / freq stops      [place]   Chronic respiratory failure with hypoxia (HCC)      Allergies  Allergen Reactions   Penicillins Itching and Other (See Comments)    "Cillin" Family - Amoxicillin - itching    Did it involve swelling of the face/tongue/throat, SOB, or low BP? Yes Did it involve sudden or severe rash/hives, skin peeling, or any reaction on the inside of your mouth or nose? no Did you need to seek medical attention  at a hospital or doctor's office? yes When did it last happen?30 years If all above answers are NO, may proceed with cephalosporin use.   Doxycycline Rash    Immunization History  Administered Date(s) Administered   Influenza, High Dose Seasonal PF 08/29/2019   PFIZER SARS-COV-2 Vaccination 01/12/2020, 02/09/2020    Past Medical History:  Diagnosis Date   Anxiety    Bronchitis    Fibromyalgia    Hypertension    PMH: 2010   Hypothyroidism    Seasonal allergies    Spinal stenosis    L-5 to S-1   Thyroid disease    Wears glasses     Tobacco History: Social History   Tobacco Use  Smoking Status Never Smoker  Smokeless Tobacco Never Used   Counseling given: Yes   Continue to not smoke  Outpatient Encounter Medications as of 11/22/2020  Medication Sig   albuterol (PROVENTIL) (2.5 MG/3ML) 0.083% nebulizer solution Take 2.5 mg by nebulization every 4 (four) hours as needed for wheezing or shortness of breath.    Ascorbic Acid (VITAMIN C) 100 MG tablet Take 100 mg by mouth daily.   aspirin EC 81 MG tablet Take 81 mg by mouth daily.   atorvastatin (LIPITOR) 80 MG tablet Take 80 mg by mouth daily.    azaTHIOprine (IMURAN) 50 MG tablet Take 2 tablets (100 mg total) by mouth daily.   budesonide-formoterol (SYMBICORT) 80-4.5 MCG/ACT inhaler Inhale 2 puffs into the lungs 2 (two) times daily.   Calcium 250 MG CAPS Take 500 mg by mouth 2 (two) times daily.   citalopram (CELEXA) 40 MG tablet Take 40 mg by mouth at bedtime.    famotidine (PEPCID) 20 MG tablet One after supper   gabapentin (NEURONTIN) 100 MG capsule    hydrOXYzine (ATARAX/VISTARIL) 25 MG tablet Take 25 mg by mouth at bedtime.    ibuprofen (ADVIL) 800 MG tablet Take 800 mg by mouth daily as needed for mild pain or moderate pain.    levothyroxine (SYNTHROID, LEVOTHROID)  125 MCG tablet Take 125 mcg by mouth daily before breakfast.   loratadine-pseudoephedrine (CLARITIN-D 24-HOUR)  10-240 MG 24 hr tablet Take 1 tablet by mouth daily.   Magnesium 250 MG TABS Take 1 tablet by mouth at bedtime.   metoprolol succinate (TOPROL-XL) 25 MG 24 hr tablet Take 25 mg by mouth daily.    montelukast (SINGULAIR) 10 MG tablet Take 10 mg by mouth daily.    morphine (MSIR) 15 MG tablet Take 15 mg by mouth 3 (three) times daily as needed for moderate pain or severe pain.    Multiple Vitamin (MULTIVITAMIN WITH MINERALS) TABS tablet Take 1 tablet by mouth daily.   nitroGLYCERIN (NITROSTAT) 0.4 MG SL tablet Place 0.4 mg under the tongue every 5 (five) minutes as needed for chest pain. AS NEEDED   omeprazole (PRILOSEC OTC) 20 MG tablet Prilosec OTC 20 mg tablet,delayed release   ondansetron (ZOFRAN ODT) 4 MG disintegrating tablet Take 1 tablet (4 mg total) by mouth every 8 (eight) hours as needed for nausea or vomiting.   pantoprazole (PROTONIX) 40 MG tablet TAKE 1 TABLET BY MOUTH DAILY 30- 60 MINUTES BEFORE FIRST MEAL OF THE DAY   pimecrolimus (ELIDEL) 1 % cream Apply 1 application topically 2 (two) times daily.   sulfamethoxazole-trimethoprim (BACTRIM DS) 800-160 MG tablet Take 1 tablet by mouth 3 (three) times a week.   tiZANidine (ZANAFLEX) 4 MG capsule Take 1 capsule (4 mg total) by mouth 3 (three) times daily. (Patient taking differently: Take 4 mg by mouth 3 (three) times daily as needed. )   VITAMIN D, CHOLECALCIFEROL, PO Take 1 tablet by mouth daily.   No facility-administered encounter medications on file as of 11/22/2020.     Review of Systems  Review of Systems  Constitutional: Positive for fatigue. Negative for activity change and fever.  HENT: Positive for congestion (white mucous - baseline ). Negative for sinus pressure, sinus pain and sore throat.   Respiratory: Positive for cough and shortness of breath. Negative for wheezing.   Cardiovascular: Negative for chest pain and palpitations.  Gastrointestinal: Negative for diarrhea, nausea and vomiting.   Musculoskeletal: Negative for arthralgias.  Neurological: Negative for dizziness.  Psychiatric/Behavioral: Negative for sleep disturbance. The patient is not nervous/anxious.      Physical Exam  BP 124/64 (BP Location: Left Arm, Cuff Size: Normal)    Pulse 92    Temp 97.7 F (36.5 C) (Oral)    Ht 5' 4.5" (1.638 m)    Wt 167 lb 12.8 oz (76.1 kg)    SpO2 97%    BMI 28.36 kg/m   Wt Readings from Last 5 Encounters:  11/22/20 167 lb 12.8 oz (76.1 kg)  08/03/20 176 lb (79.8 kg)  04/19/20 178 lb 9.6 oz (81 kg)  03/26/20 183 lb 6.4 oz (83.2 kg)  03/18/20 185 lb 6.4 oz (84.1 kg)    BMI Readings from Last 5 Encounters:  11/22/20 28.36 kg/m  08/03/20 30.21 kg/m  04/19/20 29.72 kg/m  03/26/20 30.52 kg/m  03/18/20 30.85 kg/m     Physical Exam Vitals and nursing note reviewed.  Constitutional:      General: She is not in acute distress.    Appearance: Normal appearance. She is normal weight.  HENT:     Head: Normocephalic and atraumatic.     Right Ear: Tympanic membrane, ear canal and external ear normal. There is no impacted cerumen.     Left Ear: Tympanic membrane, ear canal and external ear normal. There is no impacted  cerumen.     Nose: Rhinorrhea present. No congestion.     Mouth/Throat:     Mouth: Mucous membranes are moist.     Pharynx: Oropharynx is clear.     Comments: +PND Eyes:     Pupils: Pupils are equal, round, and reactive to light.  Cardiovascular:     Rate and Rhythm: Normal rate and regular rhythm.     Pulses: Normal pulses.     Heart sounds: Normal heart sounds. No murmur heard.   Pulmonary:     Effort: Pulmonary effort is normal. No respiratory distress.     Breath sounds: No decreased air movement. Rales present. No decreased breath sounds or wheezing.  Musculoskeletal:     Cervical back: Normal range of motion.  Skin:    General: Skin is warm and dry.     Capillary Refill: Capillary refill takes less than 2 seconds.  Neurological:     General:  No focal deficit present.     Mental Status: She is alert and oriented to person, place, and time. Mental status is at baseline.     Gait: Gait normal.  Psychiatric:        Mood and Affect: Mood normal.        Behavior: Behavior normal.        Thought Content: Thought content normal.        Judgment: Judgment normal.       Assessment & Plan:   Pulmonary hypertension ([place]) Plan: Start oxygen with physical exertion-4 L We will check overnight oximetry on room air Complete echocardiogram as planned in February/2022 May need to consider additional interventions with follow-up echocardiogram such as right heart cath/[name]  Allergic rhinitis Plan: Continue Claritin Continue Singulair Can use chlor tabs at night to help with postnasal drip  Chronic respiratory failure with hypoxia ([place]) Plan: Walk today in office-patient required 4 L of O2 with physical exertion We will order overnight oximetry Community message sent to [employer] notify the patient Called [employer] Representative Melissa to notify of patient's new O2 start Patient discharged from office today with adapt tank Referral to pulmonary rehab  ILD (interstitial lung disease) ([place]) Plan: Continue Imuran Continue follow-up with rheumatology Start oxygen-4 L of O2 with physical exertion Overnight oximetry ordered Complete high-resolution CT chest as planned in February/2022 Complete echocardiogram as planned in February/2022 May need to consider antifibrotic's or additional interventions such as [name] based off a follow-up high-resolution CT chest and echocardiogram We will decide on repeat pulmonary function testing based off of high-resolution CT chest results in February/2022 Referral to pulmonary rehab today  Healthcare maintenance She reports she is up-to-date with COVID-19 vaccinations including booster  Plan: Encourage seasonal flu vaccine and pneumonia vaccine, will check status of vaccination records  with primary care  Physical deconditioning Plan: Work on increasing overall physical mobility Referral to pulmonary rehab  Sjogren's syndrome ([place]) Plan: Continue to work with rheumatology Continue Imuran    Return in about 2 months (around 01/23/2021), or if symptoms worsen or fail to improve, for Follow up with Dr. Vaughan Browner, After Chest CT.   Lauraine Rinne, NP 11/22/2020   This appointment required 75 minutes of patient care (this includes precharting, chart review, review of results, face-to-face care, etc.).

## 2020-11-22 NOTE — Patient Instructions (Addendum)
You were seen today by Lauraine Rinne, NP  for:   1. ILD (interstitial lung disease) ([place])  Continue Imuran as managed by rheumatology  Continue Bactrim Monday Wednesday Friday  Walk today in office  We will plan on repeat high-resolution CT chest as ordered in February/2022  We will plan on repeat echocardiogram in February/2022  Continue Symbicort 80 >>> 2 puffs in the morning right when you wake up, rinse out your mouth after use, 12 hours later 2 puffs, rinse after use >>> Take this daily, no matter what >>> This is not a rescue inhaler   2. Chronic respiratory failure with hypoxia (HCC)  Walk today in office revealed that he needed 4 L O2 with physical exertion  Tentatively for right now go ahead and use 2 L of O2 at night  We will order overnight oximetry test on room air to evaluate oxygen needs at night  Continue oxygen therapy as prescribed  >>>maintain oxygen saturations greater than 88 percent  >>>if unable to maintain oxygen saturations please contact the office  >>>do not smoke with oxygen  >>>can use nasal saline gel or nasal saline rinses to moisturize nose if oxygen causes dryness  3. Allergic rhinitis, unspecified seasonality, unspecified trigger  Continue Claritin  Continue Singulair  Please start taking chlorpheniramine (aka Chlor tabs) 4 mg tablet (1 to 2 tablets at night) for management of allergies and postnasal drip at night >>> This is an over-the-counter medication >>> This medication is sedating  4. Sjogren's syndrome, with unspecified organ involvement ([place])  Continue Imuran  Continue follow-up with rheumatology  5. Pulmonary hypertension (HCC)  Complete echocardiogram as planned in February/2022  6. Physical deconditioning  Work on increasing your overall physical mobility  Walk today in office  May benefit from referral to pulmonary rehab  7. Healthcare maintenance   We will need to update our records before you leave to  reflect that you receive the COVID-19 booster vaccination  If you have received your pneumonia vaccine before as well as your seasonal flu vaccine for 2021 please let us know so we can update our records  If not then we would recommend the seasonal flu vaccine today, and we will also recommend the Pneumovax 23 at next follow-up with either primary care or our office  Follow Up:    Return in about 2 months (around 01/23/2021), or if symptoms worsen or fail to improve, for Follow up with Dr. Vaughan Browner, After Chest CT.   Notification of test results are managed in the following manner: If there are  any recommendations or changes to the  plan of care discussed in office today,  we will contact you and let you know what they are. If you do not hear from [place], then your results are normal and you can view them through your  MyChart account , or a letter will be sent to you. Thank you again for trusting [place] with your care  - Thank you, [facility]    It is flu season:   >>> Best ways to protect herself from the flu: Receive the yearly flu vaccine, practice good hand hygiene washing with soap and also using hand sanitizer when available, eat a nutritious meals, get adequate rest, hydrate appropriately       Please contact the office if your symptoms worsen or you have concerns that you are not improving.   Thank you for choosing [facility] for your healthcare, and for allowing [place] to partner with  you on your healthcare journey. I am thankful to be able to provide care to you today.   Wyn Quaker FNP-C    Home Oxygen Use, Adult When a medical condition keeps you from getting enough oxygen, your health care provider may instruct you to take extra oxygen at home. Your health care provider will let you know:  When to take oxygen.  For how long to take oxygen.  How quickly oxygen should be delivered (flow rate), in liters per minute (LPM or L/M). Home oxygen can be given  through:  A mask.  A nasal cannula. This is a device or tube that goes in the nostrils.  A transtracheal catheter. This is a small, flexible tube placed in the trachea.  A tracheostomy. This is a surgically made opening in the trachea. These devices are connected with tubing to an oxygen source, such as:  A tank. Tanks hold oxygen in gas form. They must be replaced when the oxygen is used up.  A liquid oxygen device. This holds oxygen in liquid form. It must be replaced when the oxygen is used up.  An oxygen concentrator machine. This filters oxygen in the room. It uses electricity, so you must have a backup cylinder of oxygen in case the power goes out. Supplies needed: To use oxygen, you will need:  A mask, nasal cannula, transtracheal catheter, or tracheostomy.  An oxygen tank, a liquid oxygen device, or an oxygen concentrator.  The tape that your health care provider recommends (optional). If you use a transtracheal catheter and your prescribed flow rate is 1 LPM or greater, you will also need a humidifier. Risks and complications  Fire. This can happen if the oxygen is exposed to a heat source, flame, or spark.  Injury to skin. This can happen if liquid oxygen touches your skin.  Organ damage. This can happen if you get too little oxygen. How to use oxygen Your health care provider or a representative from your [place] will show you how to use your oxygen device. Follow her or his instructions. The instructions may look something like this: 1. Wash your hands. 2. If you use an oxygen concentrator, make sure it is plugged in. 3. Place one end of the tube into the port on the tank, device, or machine. 4. Place the mask over your nose and mouth. Or, place the nasal cannula and secure it with tape if instructed. If you use a tracheostomy or transtracheal catheter, connect it to the oxygen source as directed. 5. Make sure the liter-flow setting on the machine is  at the level prescribed by your health care provider. 6. Turn on the machine or adjust the knob on the tank or device to the correct liter-flow setting. 7. When you are done, turn off and unplug the machine, or turn the knob to OFF. How to clean and care for the oxygen supplies Nasal cannula  Clean it with a warm, wet cloth daily or as needed.  Wash it with a liquid soap once a week.  Rinse it thoroughly once or twice a week.  Replace it every 2-4 weeks.  If you have an infection, such as a cold or pneumonia, change the cannula when you get better. Mask  Replace it every 2-4 weeks.  If you have an infection, such as a cold or pneumonia, change the mask when you get better. Humidifier bottle  Wash the bottle between each refill: ? Wash it with soap and warm water. ?  Rinse it thoroughly. ? Disinfect it and its top. ? Air-dry it.  Make sure it is dry before you refill it. Oxygen concentrator  Clean the air filter at least twice a week according to directions from your home medical equipment and service company.  Wipe down the cabinet every day. To do this: ? Unplug the unit. ? Wipe down the cabinet with a damp cloth. ? Dry the cabinet. Other equipment  Change any extra tubing every 1-3 months.  Follow instructions from your health care provider about taking care of any other equipment. Safety tips Fire safety tips   Keep your oxygen and oxygen supplies at least 5 ft away from sources of heat, flames, and sparks at all times.  Do not allow smoking near your oxygen. Put up "no smoking" signs in your home. Avoid smoking areas when in public.  Do not use materials that can burn (are flammable) while you use oxygen.  When you go to a restaurant with portable oxygen, ask to be seated in the nonsmoking section.  Keep a Data processing manager close by. Let your fire department know that you have oxygen in your home.  Test your home smoke detectors  regularly. Traveling  Secure your oxygen tank in the vehicle so that it does not move around. Follow instructions from your medical device company about how to safely secure your tank.  Make sure you have enough oxygen for the amount of time you will be away from home.  If you are planning air travel, contact the airline to find out if they allow the use of an approved portable oxygen concentrator. You may also need documents from your health care provider and medical device company before you travel. General safety tips  If you use an oxygen cylinder, make sure it is in a stand or secured to an object that will not move (fixed object).  If you use liquid oxygen, make sure its container is kept upright.  If you use an oxygen concentrator: ? Dance movement psychotherapist company. Make sure you are given priority service in the event that your power goes out. ? Avoid using extension cords, if possible. Follow these instructions at home:  Use oxygen only as told by your health care provider.  Do not use alcohol or other drugs that make you relax (sedating drugs) unless instructed. They can slow down your breathing rate and make it hard to get in enough oxygen.  Know how and when to order a refill of oxygen.  Always keep a spare tank of oxygen. Plan ahead for holidays when you may not be able to get a prescription filled.  Use water-based lubricants on your lips or nostrils. Do not use oil-based products like petroleum jelly.  To prevent skin irritation on your cheeks or behind your ears, tuck some gauze under the tubing. Contact a health care provider if:  You get headaches often.  You have shortness of breath.  You have a lasting cough.  You have anxiety.  You are sleepy all the time.  You develop an illness that affects your breathing.  You cannot exercise at your regular level.  You are restless.  You have difficult or irregular breathing, and it is getting worse.  You have a  fever.  You have persistent redness under your nose. Get help right away if:  You are confused.  You have blue lips or fingernails.  You are struggling to breathe. Summary  Your health care provider or a representative from  your medical device company will show you how to use your oxygen device. Follow her or his instructions.  If you use an oxygen concentrator, make sure it is plugged in.  Make sure the liter-flow setting on the machine is at the level prescribed by your health care provider.  Keep your oxygen and oxygen supplies at least 5 ft away from sources of heat, flames, and sparks at all times. This information is not intended to replace advice given to you by your health care provider. Make sure you discuss any questions you have with your health care provider. Document Revised: 05/16/2018 Document Reviewed: 06/20/2016 Elsevier Patient Education  [place].

## 2020-11-22 NOTE — Assessment & Plan Note (Signed)
Plan: Continue Claritin Continue Singulair Can use chlor tabs at night to help with postnasal drip

## 2020-11-22 NOTE — Assessment & Plan Note (Signed)
Plan: Walk today in office-patient required 4 L of O2 with physical exertion We will order overnight oximetry Community message sent to [employer] [facility] notify the patient Called [employer] Representative Melissa to notify of patient's new O2 start Patient discharged from office today with [facility] tank Referral to pulmonary rehab

## 2020-11-22 NOTE — Assessment & Plan Note (Addendum)
Plan: Continue to work with rheumatology Continue Imuran

## 2020-11-22 NOTE — Assessment & Plan Note (Signed)
She reports she is up-to-date with COVID-19 vaccinations including booster  Plan: Encourage seasonal flu vaccine and pneumonia vaccine, will check status of vaccination records with primary care

## 2020-11-22 NOTE — Assessment & Plan Note (Signed)
Plan: Continue Imuran Continue follow-up with rheumatology Start oxygen-4 L of O2 with physical exertion Overnight oximetry ordered Complete high-resolution CT chest as planned in February/2022 Complete echocardiogram as planned in February/2022 May need to consider antifibrotic's or additional interventions such as [name] based off a follow-up high-resolution CT chest and echocardiogram We will decide on repeat pulmonary function testing based off of high-resolution CT chest results in February/2022 Referral to pulmonary rehab today

## 2020-11-23 ENCOUNTER — Telehealth (HOSPITAL_COMMUNITY): Payer: Self-pay | Admitting: *Deleted

## 2020-11-23 NOTE — Telephone Encounter (Signed)
Received referral from Dr. Vaughan Browner for this pt to participate in Pulmonary rehab with the diagnosis of ILD.  Noted in pt history, resides in [place]. Called and spoke to pt who would much prefer the pulmonary rehab program at [facility]. Verbal consent obtained to send via fax the pulmonary rehab referral form. Cherre Huger, BSN Cardiac and Training and development officer

## 2020-11-29 DIAGNOSIS — E785 Hyperlipidemia, unspecified: Secondary | ICD-10-CM | POA: Diagnosis not present

## 2020-11-29 DIAGNOSIS — M1990 Unspecified osteoarthritis, unspecified site: Secondary | ICD-10-CM | POA: Diagnosis not present

## 2020-11-29 DIAGNOSIS — F418 Other specified anxiety disorders: Secondary | ICD-10-CM | POA: Diagnosis not present

## 2020-11-29 DIAGNOSIS — I10 Essential (primary) hypertension: Secondary | ICD-10-CM | POA: Diagnosis not present

## 2020-11-29 DIAGNOSIS — Z23 Encounter for immunization: Secondary | ICD-10-CM | POA: Diagnosis not present

## 2020-11-29 DIAGNOSIS — E039 Hypothyroidism, unspecified: Secondary | ICD-10-CM | POA: Diagnosis not present

## 2020-11-29 DIAGNOSIS — J9611 Chronic respiratory failure with hypoxia: Secondary | ICD-10-CM | POA: Diagnosis not present

## 2020-11-29 DIAGNOSIS — Z9981 Dependence on supplemental oxygen: Secondary | ICD-10-CM | POA: Diagnosis not present

## 2020-11-29 DIAGNOSIS — J849 Interstitial pulmonary disease, unspecified: Secondary | ICD-10-CM | POA: Diagnosis not present

## 2020-12-02 ENCOUNTER — Ambulatory Visit: Payer: Medicare PPO | Admitting: Pulmonary Disease

## 2020-12-08 ENCOUNTER — Telehealth: Payer: Self-pay | Admitting: Pulmonary Disease

## 2020-12-08 DIAGNOSIS — J849 Interstitial pulmonary disease, unspecified: Secondary | ICD-10-CM

## 2020-12-08 DIAGNOSIS — J9611 Chronic respiratory failure with hypoxia: Secondary | ICD-10-CM

## 2020-12-09 NOTE — Telephone Encounter (Signed)
Left message for patient to call back.

## 2020-12-09 NOTE — Telephone Encounter (Signed)
Please place a new O2 order for patient as she would like to use a different DME for her O2.

## 2020-12-10 ENCOUNTER — Other Ambulatory Visit: Payer: Self-pay | Admitting: Pulmonary Disease

## 2020-12-10 DIAGNOSIS — J849 Interstitial pulmonary disease, unspecified: Secondary | ICD-10-CM

## 2020-12-13 NOTE — Telephone Encounter (Signed)
New order placed at [facility] request. lmtcb X2 for pt to make aware.

## 2020-12-15 ENCOUNTER — Telehealth: Payer: Self-pay | Admitting: Pulmonary Disease

## 2020-12-15 NOTE — Telephone Encounter (Signed)
Called and spoke with [name] about her O2 looks like order was placed on 12/13/20 for her to switch over to Kristie [name]. [name] is wondering if something has to be sent to [employer] for them to come and pick up all of their equipment.  Please advise

## 2020-12-16 ENCOUNTER — Telehealth: Payer: Self-pay | Admitting: Pulmonary Disease

## 2020-12-16 NOTE — Telephone Encounter (Signed)
Called and spoke with pt and advised her that once she gets the supplies from [facility] that she can let us know and we will send the order to [employer] to have them pick up their equipment.  Pt voiced her understanding.

## 2020-12-16 NOTE — Telephone Encounter (Signed)
Called and spoke with Patient.  Patient stated she reached out to [facility] to check on O2 delivery and was told they never received a O2 order from [facility]. Order was placed 12/13/20 by Caryl Pina, CMA and faxed by [place], [facility]. [place] Patient to follow up on order and spoke with Claiborne Billings. Claiborne Billings stated order was not received, but they did see order placed in epic.  Claiborne Billings stated they would take care of Patient's O2 order, because they were able to upload it from epic. Called and spoke with Patient.  Patient made aware [facility] is working on her order at this time. Nothing further needed.

## 2020-12-16 NOTE — Telephone Encounter (Signed)
Yes there will need to be a order to d/c oxygen from [employer] if she has already received something from them

## 2020-12-20 ENCOUNTER — Telehealth: Payer: Self-pay | Admitting: Pulmonary Disease

## 2020-12-21 ENCOUNTER — Telehealth: Payer: Self-pay | Admitting: Pulmonary Disease

## 2020-12-21 DIAGNOSIS — B370 Candidal stomatitis: Secondary | ICD-10-CM

## 2020-12-21 MED ORDER — NYSTATIN 100000 UNIT/ML MT SUSP: 5.0000 mL | Freq: Four times a day (QID) | OROMUCOSAL | 0 refills | Status: DC

## 2020-12-21 NOTE — Telephone Encounter (Signed)
Pt called back checking status of previous call . Please advise . Call back [phone]

## 2020-12-21 NOTE — Telephone Encounter (Signed)
12/21/2020  See other triage message.  Nothing further needed  Wyn Quaker FNP

## 2020-12-21 NOTE — Telephone Encounter (Signed)
12/21/2020  Called and spoke with patient.  Patient is no longer established with [employer] [facility].  She is established with [facility].  She reports that [place] patient has not called her about an overnight oximetry test.  Plan: Patient to contact [place] patient for update She can notify our office if additional interventions are needed Reviewed with patient that when needing to follow-up with the DME company she needs to contact them directly   Patient also reporting oral thrush.  She reports that she has very sensitive and painful swallowing and red tongue.  She feels this is a flare of previously diagnosed oral thrush.  Patient is on Symbicort 80.  She reports appropriate oral care.  Patient is aware that if symptoms worsen she needs to contact her office or seek follow-up with primary care or our office for an in person visit.  Plan: Will treat with nystatin Will have patient move up follow-up appointment with Dr. Vaughan Browner from February/2022 to 01/05/2021 at 11:15 AM  Nothing further needed  [place]

## 2020-12-23 DIAGNOSIS — J849 Interstitial pulmonary disease, unspecified: Secondary | ICD-10-CM | POA: Diagnosis not present

## 2020-12-23 DIAGNOSIS — I272 Pulmonary hypertension, unspecified: Secondary | ICD-10-CM | POA: Diagnosis not present

## 2020-12-23 DIAGNOSIS — J9611 Chronic respiratory failure with hypoxia: Secondary | ICD-10-CM | POA: Diagnosis not present

## 2021-01-05 ENCOUNTER — Other Ambulatory Visit: Payer: Self-pay

## 2021-01-05 ENCOUNTER — Encounter: Payer: Self-pay | Admitting: Pulmonary Disease

## 2021-01-05 ENCOUNTER — Ambulatory Visit: Payer: Medicare PPO | Admitting: Pulmonary Disease

## 2021-01-05 VITALS — BP 112/62 | HR 88 | Temp 97.3°F | Ht 65.0 in | Wt 174.8 lb

## 2021-01-05 DIAGNOSIS — J849 Interstitial pulmonary disease, unspecified: Secondary | ICD-10-CM | POA: Diagnosis not present

## 2021-01-05 DIAGNOSIS — B370 Candidal stomatitis: Secondary | ICD-10-CM

## 2021-01-05 LAB — CBC WITH DIFFERENTIAL/PLATELET
Basophils Absolute: 0.1 10*3/uL (ref 0.0–0.1)
Basophils Relative: 0.9 % (ref 0.0–3.0)
Eosinophils Absolute: 0.7 10*3/uL (ref 0.0–0.7)
Eosinophils Relative: 5.4 % — ABNORMAL HIGH (ref 0.0–5.0)
HCT: 33.5 % — ABNORMAL LOW (ref 36.0–46.0)
Hemoglobin: 10.9 g/dL — ABNORMAL LOW (ref 12.0–15.0)
Lymphocytes Relative: 14.5 % (ref 12.0–46.0)
Lymphs Abs: 1.9 10*3/uL (ref 0.7–4.0)
MCHC: 32.5 g/dL (ref 30.0–36.0)
MCV: 93.6 fl (ref 78.0–100.0)
Monocytes Absolute: 1.1 10*3/uL — ABNORMAL HIGH (ref 0.1–1.0)
Monocytes Relative: 8.3 % (ref 3.0–12.0)
Neutro Abs: 9.2 10*3/uL — ABNORMAL HIGH (ref 1.4–7.7)
Neutrophils Relative %: 70.9 % (ref 43.0–77.0)
Platelets: 453 10*3/uL — ABNORMAL HIGH (ref 150.0–400.0)
RBC: 3.59 Mil/uL — ABNORMAL LOW (ref 3.87–5.11)
RDW: 15.4 % (ref 11.5–15.5)
WBC: 13 10*3/uL — ABNORMAL HIGH (ref 4.0–10.5)

## 2021-01-05 MED ORDER — NYSTATIN 100000 UNIT/ML MT SUSP: 5.0000 mL | Freq: Four times a day (QID) | OROMUCOSAL | 0 refills | Status: DC

## 2021-01-05 MED ORDER — AZATHIOPRINE 50 MG PO TABS: ORAL_TABLET | ORAL | 3 refills | Status: DC

## 2021-01-05 NOTE — Patient Instructions (Addendum)
Recheck CBC today Increase Imuran to 150 mg a day We will give you nystatin for thrush Stop Symbicort  Prescribe portable concentrator. Follow-up in 3 months.

## 2021-01-05 NOTE — Progress Notes (Addendum)
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  Followed with Dr. Amil Amen and Marella Chimes at [facility] rheumatology for Sjogren's syndrome with positive ANA, SSA, sicca syndrome and positive rheumatoid factor. 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  History also notable for coronary artery disease with stent placement at [facility] [address]  Echocardiogram at [facility] last year shows moderate pulmonary hypertension which improved on follow up echo.  Evaluated in end of Jan 2021 for elevated WBC count.  SARS-CoV-2 test was negative.  CT with changes of pneumonia and she was given doxycycline in the ED High-res CT scan does not show ongoing pneumonia but has fibrosis and probable UIP pattern  Underwent bronchoscopy on 02/26/2020 to rule out infection given elevated WBC count Given prednisone taper and started on azathioprine 50 mg and Bactrim on 3/21.  Azathioprine increased to 100 mg on 5/21 Has also been evaluated by oncology on 03/26/2020 for elevated WBC count thought to be secondary to inflammation secondary to autoimmune disease.  Pets: No pets  Occupation: Retired [employer] consultant Exposures: No known exposures.  No mold, hot tub, Jacuzzi.  No down pillows or comforter Smoking history: Never smoked Travel history: No significant travel history Relevant family history: No significant family history of lung  Interim History: Tolerating Imuran 100 mg. States that dyspnea is overall  improved but continues to have arthritis symptoms  Recently developed thrush which is attributed to Symbicort She is due for CT scan next month.  Outpatient Encounter Medications as of 01/05/2021  Medication Sig   albuterol (PROVENTIL) (2.5 MG/3ML) 0.083% nebulizer solution Take 2.5 mg by nebulization every 4 (four) hours as needed for wheezing or shortness of breath.    Ascorbic Acid (VITAMIN C) 100 MG tablet Take 100 mg by mouth daily.   aspirin EC 81 MG tablet Take 81 mg by mouth daily.   atorvastatin (LIPITOR) 80 MG tablet Take 80 mg by mouth daily.    azaTHIOprine (IMURAN) 50 MG tablet TAKE 2 TABLETS EVERY DAY (DOSE CHANGE)   budesonide-formoterol (SYMBICORT) 80-4.5 MCG/ACT inhaler Inhale 2 puffs into the lungs 2 (two) times daily.   Calcium 250 MG CAPS Take 500 mg by mouth 2 (two) times daily.   citalopram (CELEXA) 40 MG tablet Take 40 mg by mouth at bedtime.    famotidine (PEPCID) 20 MG tablet One after supper   gabapentin (NEURONTIN) 100 MG capsule    hydrOXYzine (ATARAX/VISTARIL) 25 MG tablet Take 25 mg by mouth at bedtime.    ibuprofen (ADVIL) 800 MG tablet Take 800 mg by mouth daily as needed for mild pain or moderate pain.    levothyroxine (SYNTHROID, LEVOTHROID) 125 MCG tablet Take 125 mcg by mouth daily before breakfast.   loratadine-pseudoephedrine (CLARITIN-D 24-HOUR) 10-240 MG 24 hr tablet Take 1 tablet by mouth daily.   Magnesium 250 MG TABS Take 1 tablet by mouth at bedtime.   metoprolol succinate (TOPROL-XL) 25 MG 24 hr tablet Take 25 mg  by mouth daily.    montelukast (SINGULAIR) 10 MG tablet Take 10 mg by mouth daily.    morphine (MSIR) 15 MG tablet Take 15 mg by mouth 3 (three) times daily as needed for moderate pain or severe pain.    Multiple Vitamin (MULTIVITAMIN WITH MINERALS) TABS tablet Take 1 tablet by mouth daily.   nitroGLYCERIN (NITROSTAT) 0.4 MG SL tablet Place 0.4 mg under the tongue every 5 (five) minutes as needed for chest pain. AS NEEDED   nystatin  (MYCOSTATIN) 100000 UNIT/ML suspension Take 5 mLs (500,000 Units total) by mouth 4 (four) times daily.   omeprazole (PRILOSEC OTC) 20 MG tablet Prilosec OTC 20 mg tablet,delayed release   ondansetron (ZOFRAN ODT) 4 MG disintegrating tablet Take 1 tablet (4 mg total) by mouth every 8 (eight) hours as needed for nausea or vomiting.   pantoprazole (PROTONIX) 40 MG tablet TAKE 1 TABLET BY MOUTH DAILY 30- 60 MINUTES BEFORE FIRST MEAL OF THE DAY   pimecrolimus (ELIDEL) 1 % cream Apply 1 application topically 2 (two) times daily.   sulfamethoxazole-trimethoprim (BACTRIM DS) 800-160 MG tablet Take 1 tablet by mouth 3 (three) times a week.   tiZANidine (ZANAFLEX) 4 MG capsule Take 1 capsule (4 mg total) by mouth 3 (three) times daily. (Patient taking differently: Take 4 mg by mouth 3 (three) times daily as needed.)   VITAMIN D, CHOLECALCIFEROL, PO Take 1 tablet by mouth daily.   No facility-administered encounter medications on file as of 01/05/2021.   Physical Exam: Blood pressure 110/70, pulse 74, temperature 97.7 F (36.5 C), temperature source Other (Comment), height _0  (1.626 m), weight 176 lb (79.8 kg), SpO2 95 %. Gen:      No acute distress HEENT:  EOMI, sclera anicteric Neck:     No masses; no thyromegaly Lungs:    Clear to auscultation bilaterally; normal respiratory effort CV:         Regular rate and rhythm; no murmurs Abd:      + bowel sounds; soft, non-tender; no palpable masses, no distension Ext:    No edema; adequate peripheral perfusion Skin:      Warm and dry; no rash Neuro: alert and oriented x 3 Psych: normal mood and affect  Data Reviewed: Imaging: CT high-resolution 07/24/2019-patchy groundglass with septal thickening, reticulation, bronchiectasis with no honeycombing.  Basal gradient noted.  Probable UIP pattern  CTA 01/10/2020-No PE, mild bibasal consolidation.  Chronic ILD.  CT high-resolution 02/05/2020-pulmonary fibrosis and probable UIP pattern without progression  since 2020. I have reviewed the images personally.  PFTs:  03/09/2020 FVC 1.74 (54%), FEV1 1.51 [61%],/57, TLC 2.75 [52%], DLCO 8.44 [41%] Severe restriction and diffusion defect.  08/03/2020 FVC 1.74 [54%], FEV1 1.61 [6 6%], F/F 93 TLC 2.75 [53%], DLCO 9.52 [47%] Severe restriction and diffusion defect with improvement in DLCO compared to prior  6-minute walk test 02/06/2020-374 m, nadir O2 sat 94%  Labs: CBC 05/29/2019-WBC 13.2, eos 6.9%, absolute eosinophil count 911 CBC 03/26/2020 - WBC 14.8 eos 8%, absolute eosinophil count 1184  ANA 05/29/2019- > 1:1280 Repeat CTD serologies 01/09/2020-ANA 1 is to 1280, rheumatoid factor 14, positive SSA  SARS-CoV-2 01/10/2020- negative  Bronchoscopy 02/26/2020- Cell count 113, 42% lymphs Microbiology, cytology -negative  Cardiac: Echocardiogram ([facility]) 04/18/2019 Normal LV size, LVEF 27-74%, grade 1 diastolic dysfunction.  RVSP of 40-50 consistent with moderate pulmonary  Echocardiogram 01/12/2020 Normal LVEF 12-87%, grade 1 diastolic dysfunction, RVSP 35  Assessment:  CTD related interstitial lung disease, pulmonary fibrosis Sjogren's syndrome Continues on  Imuran. She initially had nausea with Imuran, but improved with eating and taking medication mid meal.  PFTs with mild improvement in diffusion capacity. She continues to have significant arthritis symptoms  Check CBC, increase Imuran to 150 mg a day Follow-up CT next month.  If there is progression then consider addition of antifibrotics  Leukocytosis Seen by hematology after last visit and I reviewed notes. Leukocytosis consistent with ILD , autoimmune disorder , and prednisone therapy.    Thrush Nystatin ordered. We will stop Symbicort as she likely does not require inhalers with no evidence of obstructive airway disease  Pulmonary hypertension Related to cardiomyopathy, coronary artery disease, diastolic dysfunction versus pulmonary hypertension from connective tissue  disease Repeat echocardiogram reviewed at last visit shows improvement in LVEF and reduction in RVSP.   Continue monitoring.  She has a cardiologist at [facility].  Plan/Recommendations: - Increase Imuran to 150 mg a day - Continue Bactrim 3 times daily - Nystatin, stop Symbicort - Follow-up CT in February 2022  Marshell Garfinkel MD [facility] 01/05/2021, 11:47 AM  CC: Lowella Dandy, NP

## 2021-01-12 ENCOUNTER — Telehealth: Payer: Self-pay | Admitting: Pulmonary Disease

## 2021-01-12 DIAGNOSIS — I361 Nonrheumatic tricuspid (valve) insufficiency: Secondary | ICD-10-CM | POA: Diagnosis not present

## 2021-01-12 DIAGNOSIS — Z23 Encounter for immunization: Secondary | ICD-10-CM | POA: Diagnosis not present

## 2021-01-12 DIAGNOSIS — M3500 Sicca syndrome, unspecified: Secondary | ICD-10-CM | POA: Diagnosis not present

## 2021-01-12 DIAGNOSIS — E039 Hypothyroidism, unspecified: Secondary | ICD-10-CM | POA: Diagnosis not present

## 2021-01-12 DIAGNOSIS — R079 Chest pain, unspecified: Secondary | ICD-10-CM | POA: Diagnosis not present

## 2021-01-12 DIAGNOSIS — I110 Hypertensive heart disease with heart failure: Secondary | ICD-10-CM | POA: Diagnosis not present

## 2021-01-12 DIAGNOSIS — R0602 Shortness of breath: Secondary | ICD-10-CM | POA: Diagnosis not present

## 2021-01-12 DIAGNOSIS — F32A Depression, unspecified: Secondary | ICD-10-CM | POA: Diagnosis not present

## 2021-01-12 DIAGNOSIS — I5031 Acute diastolic (congestive) heart failure: Secondary | ICD-10-CM | POA: Diagnosis not present

## 2021-01-12 DIAGNOSIS — I340 Nonrheumatic mitral (valve) insufficiency: Secondary | ICD-10-CM | POA: Diagnosis not present

## 2021-01-12 DIAGNOSIS — J8410 Pulmonary fibrosis, unspecified: Secondary | ICD-10-CM | POA: Diagnosis not present

## 2021-01-12 DIAGNOSIS — I2510 Atherosclerotic heart disease of native coronary artery without angina pectoris: Secondary | ICD-10-CM | POA: Diagnosis not present

## 2021-01-12 DIAGNOSIS — J45909 Unspecified asthma, uncomplicated: Secondary | ICD-10-CM | POA: Diagnosis not present

## 2021-01-12 DIAGNOSIS — J9611 Chronic respiratory failure with hypoxia: Secondary | ICD-10-CM | POA: Diagnosis not present

## 2021-01-12 NOTE — Telephone Encounter (Signed)
Called and spoke with patient, she was so sob after taking a shower that she could not speak and asked her husband to speak for her.  Patients sats have been 84-85% on 4L [place] with exertion.  While sitting she is 95%, when she gets up to do laundry, she drops into the 70%.  PCP had already advised to go to the ED.  She is also having chest discomfort for which she is taking nitroglycerin.  Advised to go to the nearest ED for evaluation.  Pt. Husband stated they do not have much faith in [facility].  Advised that if her sats are in the 80's she needs to go to the nearest ED for evaluation and if they prefer, they can always request that she be transferred to a [facility] facility.  He stated they will go to the Ed.  Nothing further needed.

## 2021-01-17 ENCOUNTER — Telehealth: Payer: Self-pay | Admitting: Pulmonary Disease

## 2021-01-17 NOTE — Telephone Encounter (Signed)
Called and spoke with patient to let her know that we have sent over a request form to get her records from her recent admission and that Dr. Vaughan Browner was going to review them once we got them and we would call her with any further recommendations. Her and her daughter expressed understanding. Will wait to get records for Dr. Vaughan Browner

## 2021-01-17 NOTE — Telephone Encounter (Signed)
Spoke to patient's spouse, Billy(DPR). Abe People stated that patient was recently admitted at [facility] for pleural effusion.  Patient is scheduled for echo 01/28/2021. Abe People thinks patient may have had echo during recent admission.  I have faxed a cover sheet with patient's demographics to [facility] requesting recent notes, imaging and echo results to be faxed to our office.  Abe People is also questioning if patient should follow up with Dr. Vaughan Browner.  Will route to Dr. Oneita Jolly to advise and to be on the lookout for records.

## 2021-01-17 NOTE — Telephone Encounter (Signed)
I will review the records from [place] once we get them.  All I can see in chart and PACS is a chest x-ray from [place] on 01/12/2021 which shows mild chronic interstitial changes without acute abnormality.

## 2021-01-17 NOTE — Telephone Encounter (Signed)
pt husband is calling because pt had been admitted to [facility] ER  last week,was relased on 01/15/21.  Pt husband would like Dr Vaughan Browner to go over what the doctor did because some test were completed (not sure of all of them)  & to see if  she needs to follow up with Dr. Vaughan Browner.. offered fax number for info  but pt husband would like for Dr. Vaughan Browner to reach out to them for results   please advise [phone] or [phone]

## 2021-01-18 DIAGNOSIS — E782 Mixed hyperlipidemia: Secondary | ICD-10-CM | POA: Diagnosis not present

## 2021-01-18 DIAGNOSIS — Z955 Presence of coronary angioplasty implant and graft: Secondary | ICD-10-CM | POA: Diagnosis not present

## 2021-01-18 DIAGNOSIS — I2510 Atherosclerotic heart disease of native coronary artery without angina pectoris: Secondary | ICD-10-CM | POA: Diagnosis not present

## 2021-01-18 DIAGNOSIS — J45909 Unspecified asthma, uncomplicated: Secondary | ICD-10-CM | POA: Diagnosis not present

## 2021-01-18 DIAGNOSIS — M3502 Sicca syndrome with lung involvement: Secondary | ICD-10-CM | POA: Diagnosis not present

## 2021-01-19 DIAGNOSIS — E663 Overweight: Secondary | ICD-10-CM | POA: Diagnosis not present

## 2021-01-19 DIAGNOSIS — D72829 Elevated white blood cell count, unspecified: Secondary | ICD-10-CM | POA: Diagnosis not present

## 2021-01-19 DIAGNOSIS — J849 Interstitial pulmonary disease, unspecified: Secondary | ICD-10-CM | POA: Diagnosis not present

## 2021-01-19 DIAGNOSIS — M797 Fibromyalgia: Secondary | ICD-10-CM | POA: Diagnosis not present

## 2021-01-19 DIAGNOSIS — Z6828 Body mass index (BMI) 28.0-28.9, adult: Secondary | ICD-10-CM | POA: Diagnosis not present

## 2021-01-19 DIAGNOSIS — M3500 Sicca syndrome, unspecified: Secondary | ICD-10-CM | POA: Diagnosis not present

## 2021-01-19 DIAGNOSIS — G894 Chronic pain syndrome: Secondary | ICD-10-CM | POA: Diagnosis not present

## 2021-01-19 DIAGNOSIS — I272 Pulmonary hypertension, unspecified: Secondary | ICD-10-CM | POA: Diagnosis not present

## 2021-01-21 ENCOUNTER — Ambulatory Visit (INDEPENDENT_AMBULATORY_CARE_PROVIDER_SITE_OTHER)
Admission: RE | Admit: 2021-01-21 | Discharge: 2021-01-21 | Disposition: A | Discharge status: Home / Self Care | Payer: Medicare PPO | Source: Ambulatory Visit | Attending: Pulmonary Disease | Admitting: Pulmonary Disease

## 2021-01-21 ENCOUNTER — Other Ambulatory Visit: Payer: Self-pay

## 2021-01-21 DIAGNOSIS — J479 Bronchiectasis, uncomplicated: Secondary | ICD-10-CM | POA: Diagnosis not present

## 2021-01-21 DIAGNOSIS — J849 Interstitial pulmonary disease, unspecified: Secondary | ICD-10-CM | POA: Diagnosis not present

## 2021-01-21 DIAGNOSIS — M3502 Sicca syndrome with lung involvement: Secondary | ICD-10-CM | POA: Diagnosis not present

## 2021-01-21 DIAGNOSIS — Z79899 Other long term (current) drug therapy: Secondary | ICD-10-CM | POA: Diagnosis not present

## 2021-01-21 DIAGNOSIS — Z6829 Body mass index (BMI) 29.0-29.9, adult: Secondary | ICD-10-CM | POA: Diagnosis not present

## 2021-01-21 DIAGNOSIS — J9611 Chronic respiratory failure with hypoxia: Secondary | ICD-10-CM | POA: Diagnosis not present

## 2021-01-21 DIAGNOSIS — J8410 Pulmonary fibrosis, unspecified: Secondary | ICD-10-CM | POA: Diagnosis not present

## 2021-01-21 DIAGNOSIS — R0600 Dyspnea, unspecified: Secondary | ICD-10-CM | POA: Diagnosis not present

## 2021-01-21 DIAGNOSIS — I712 Thoracic aortic aneurysm, without rupture: Secondary | ICD-10-CM | POA: Diagnosis not present

## 2021-01-21 DIAGNOSIS — Z9981 Dependence on supplemental oxygen: Secondary | ICD-10-CM | POA: Diagnosis not present

## 2021-01-21 DIAGNOSIS — I2510 Atherosclerotic heart disease of native coronary artery without angina pectoris: Secondary | ICD-10-CM | POA: Diagnosis not present

## 2021-01-21 DIAGNOSIS — R7989 Other specified abnormal findings of blood chemistry: Secondary | ICD-10-CM | POA: Diagnosis not present

## 2021-01-24 ENCOUNTER — Ambulatory Visit: Payer: Medicare PPO | Admitting: Pulmonary Disease

## 2021-01-28 ENCOUNTER — Other Ambulatory Visit: Payer: Self-pay

## 2021-01-28 ENCOUNTER — Ambulatory Visit (HOSPITAL_COMMUNITY): Payer: Medicare PPO | Attending: Internal Medicine

## 2021-01-28 DIAGNOSIS — I10 Essential (primary) hypertension: Secondary | ICD-10-CM | POA: Diagnosis not present

## 2021-01-28 DIAGNOSIS — I272 Pulmonary hypertension, unspecified: Secondary | ICD-10-CM | POA: Diagnosis not present

## 2021-01-28 DIAGNOSIS — J849 Interstitial pulmonary disease, unspecified: Secondary | ICD-10-CM | POA: Insufficient documentation

## 2021-01-28 DIAGNOSIS — R0600 Dyspnea, unspecified: Secondary | ICD-10-CM | POA: Insufficient documentation

## 2021-01-28 DIAGNOSIS — I083 Combined rheumatic disorders of mitral, aortic and tricuspid valves: Secondary | ICD-10-CM | POA: Diagnosis not present

## 2021-01-28 LAB — ECHOCARDIOGRAM COMPLETE
Area-P 1/2: 4.6 cm2
P 1/2 time: 483 msec
S' Lateral: 3.5 cm

## 2021-01-31 NOTE — Telephone Encounter (Signed)
Noted. Will await f/u from MD.

## 2021-02-04 NOTE — Telephone Encounter (Signed)
Lattie Haw, have these records been received yet for Dr. Vaughan Browner to review? Thanks.

## 2021-02-08 DIAGNOSIS — Z0000 Encounter for general adult medical examination without abnormal findings: Secondary | ICD-10-CM | POA: Diagnosis not present

## 2021-02-08 DIAGNOSIS — Z1331 Encounter for screening for depression: Secondary | ICD-10-CM | POA: Diagnosis not present

## 2021-02-08 DIAGNOSIS — E785 Hyperlipidemia, unspecified: Secondary | ICD-10-CM | POA: Diagnosis not present

## 2021-02-08 DIAGNOSIS — Z139 Encounter for screening, unspecified: Secondary | ICD-10-CM | POA: Diagnosis not present

## 2021-02-08 DIAGNOSIS — Z9181 History of falling: Secondary | ICD-10-CM | POA: Diagnosis not present

## 2021-02-14 DIAGNOSIS — J45909 Unspecified asthma, uncomplicated: Secondary | ICD-10-CM | POA: Diagnosis not present

## 2021-02-14 DIAGNOSIS — J9601 Acute respiratory failure with hypoxia: Secondary | ICD-10-CM | POA: Diagnosis not present

## 2021-02-14 NOTE — Telephone Encounter (Signed)
Records were received and placed in Dr. Matilde Bash box to be reviewed.

## 2021-02-15 ENCOUNTER — Other Ambulatory Visit: Payer: Self-pay

## 2021-02-15 ENCOUNTER — Ambulatory Visit: Payer: Medicare PPO | Admitting: Pulmonary Disease

## 2021-02-15 ENCOUNTER — Encounter: Payer: Self-pay | Admitting: Pulmonary Disease

## 2021-02-15 ENCOUNTER — Ambulatory Visit: Payer: Medicare PPO

## 2021-02-15 VITALS — BP 124/82 | HR 86 | Temp 98.1°F | Ht 65.0 in | Wt 171.6 lb

## 2021-02-15 DIAGNOSIS — R0600 Dyspnea, unspecified: Secondary | ICD-10-CM

## 2021-02-15 DIAGNOSIS — R0609 Other forms of dyspnea: Secondary | ICD-10-CM

## 2021-02-15 DIAGNOSIS — J849 Interstitial pulmonary disease, unspecified: Secondary | ICD-10-CM

## 2021-02-15 DIAGNOSIS — I272 Pulmonary hypertension, unspecified: Secondary | ICD-10-CM

## 2021-02-15 NOTE — Patient Instructions (Addendum)
Nice to meet you  I ordered 2 tests, sleep study to evaluate for sleep apnea as well as a special scan to evaluate for chronic blood clot in the lung.  Based on the results we will discuss next steps.  We will call you to schedule those test.  With the pulmonary hypertension, ultimately, we will need a right heart catheterization to directly measure the pressures.  The timing of this procedure will depend on the results of the above test.  Return to clinic in 4 weeks with Dr. Silas Flood

## 2021-02-16 NOTE — Progress Notes (Signed)
_0  ID: Kristie [name], female    DOB: 07/16/1951, 70 y.o.   MRN: [id number]  Chief Complaint  Patient presents with   Consult    Referred by PM for possible pulm HTN. Recently had an echo that showed possible pulm HTN.     Referring provider: Lowella Dandy, NP  HPI:   70 year old whom we are seeing for evaluation of pulmonary hypertension at the request of Dr. Vaughan Browner.  Due to most recent prior pulmonary notes reviewed.  Patient has ongoing dyspnea on exertion.  Worsening over time.  Now minimal activity such as walking on flat surfaces make things worse.  Worse with inclines or stairs.  No timing during the day were things are better or worse.  No seasonal or environmental changes that she can identify with things are better or worse.  No alleviating or exacerbating factors.  Review of records indicates 07/2019 CT high-res will review shows signs consistent with UIP on my evaluation.  Repeat high-res CT 01/2020 with stable findings my interpretation.  Most recent high-res CT 2/22 2 with mild progression of peripheral fibrosis without frank honeycombing on my interpretation.  TTE 01/2020 reviewed with normal RV function, RA size, mildly elevated PASP with mild mitral valve and aortic regurgitation.  Most recent TTE 05/2835 with diastolic dysfunction, mitral valve regurgitation, mild to moderate aortic valve regurgitation severely dilated RA, elevated right ultra pressure of 8, elevated PASP, enlarged RV with normal function all suggestive worsening pressure overload.  PMH: ILD, Sjogren's, connective tissue disease Surgical history: Spine surgery, gastric band and gastric sleeve resection Family history: Mother and father with CAD, sister with diabetes and CAD, brother with CAD Social history: She is a never smoker, lives with husband in [place] / Pulmonary Flowsheets:   ACT:  No flowsheet data found.  MMRC: mMRC Dyspnea Scale mMRC Score  11/22/2020 3  02/06/2020 2     Epworth:  No flowsheet data found.  Tests:   FENO:  No results found for: NITRICOXIDE  PFT: PFT Results Latest Ref Rng & Units 08/03/2020 03/09/2020  FVC-Pre L 1.59 1.75  FVC-Predicted Pre % 50 54  FVC-Post L 1.74 1.74  FVC-Predicted Post % 54 54  Pre FEV1/FVC % % 95 76  Post FEV1/FCV % % 93 87  FEV1-Pre L 1.52 1.32  FEV1-Predicted Pre % 62 53  FEV1-Post L 1.61 1.51  DLCO uncorrected ml/min/mmHg 9.52 8.44  DLCO UNC% % 47 41  DLCO corrected ml/min/mmHg 9.52 9.01  DLCO COR %Predicted % 47 44  DLVA Predicted % 89 86  TLC L 2.75 2.75  TLC % Predicted % 53 52  RV % Predicted % 31 45  Personally reviewed and interpreted as spirometry suggestive of severe restriction, no significant bronchodilator response, TLC with moderate restriction 53% of predicted, DLCO severely reduced.  Combination of findings suggestive of parenchymal restrictive process.  WALK:  SIX MIN WALK 02/06/2020 09/18/2019 07/02/2019 05/29/2019  Medications albuterol neb sol 0.083%, atorvastatin 24m, famotidine 2104m levothyroxine 12562m metoprolol 28m31montelukast 10mg90mrphine 15mg,21mran 4mg, p90moprazole 40mg at36m5am - - -  Supplimental Oxygen during Test? (L/min) No No - No  Laps 11 - - -  Partial Lap (in Meters) 0 - - -  Baseline BP (sitting) 130/78 - - -  Baseline Heartrate 84 - - -  Baseline Dyspnea (Borg Scale) 4 - - -  Baseline Fatigue (Borg Scale) 3 - - -  Baseline SPO2 99 - - -  BP (sitting) 166/88 - - -  Heartrate 116 - - -  Dyspnea (Borg Scale) 8 - - -  Fatigue (Borg Scale) 5 - - -  SPO2 94 - - -  BP (sitting) 160/82 - - -  Heartrate 86 - - -  SPO2 97 - - -  Stopped or Paused before Six Minutes No - - -  Interpretation Hip pain;Calf pain - - -  Distance Completed 374 - - -  Tech Comments: Pt walked at an average pace completing the entire 6 min without stopping. Pt did stagger some during the walk and also coughed throughout the walk. pt walked a very slow pace for approx 20 steps and  stopped 3 x due to SOB and back pain//lmr average pace/SOB//lmr average pace/SOB//lmr    Imaging: CT Chest High Resolution  Result Date: 01/22/2021 CLINICAL DATA:  Sjogren syndrome. Follow-up interstitial lung disease. EXAM: CT CHEST WITHOUT CONTRAST TECHNIQUE: Multidetector CT imaging of the chest was performed following the standard protocol without intravenous contrast. High resolution imaging of the lungs, as well as inspiratory and expiratory imaging, was performed. COMPARISON:  01/12/2021 chest radiograph. 02/05/2020 high-resolution chest CT. FINDINGS: Cardiovascular: Mild cardiomegaly. No significant pericardial effusion/thickening. Three-vessel coronary atherosclerosis. Atherosclerotic thoracic aorta with stable ectatic 4.2 cm ascending thoracic aorta. Top-normal caliber main pulmonary artery (3.1 cm diameter). Mediastinum/Nodes: No discrete thyroid nodules. Unremarkable esophagus. No axillary adenopathy. Mildly enlarged low right paratracheal 1.1 cm node (series 2/image 48), not appreciably changed using similar measurement technique. Similar mildly enlarged 1.1 cm subcarinal node (series 2/image 58). Suggestion of stable mild bilateral hilar adenopathy, poorly delineated on this noncontrast CT. Lungs/Pleura: No pneumothorax. No pleural effusion. No acute consolidative airspace disease, lung masses or significant pulmonary nodules. Extensive patchy confluent peripheral reticulation and ground-glass opacity throughout both lungs with associated moderate traction bronchiectasis, volume loss and architectural distortion. No frank honeycombing. Basilar predominance to these findings, which have mildly progressed in the interval. No significant air trapping or evidence of tracheobronchomalacia on the expiration sequence. Upper abdomen: Cholecystectomy. Gastric band is in place in the gastric cardia region with no evidence of discontinuity in the visualized band. Musculoskeletal: No aggressive appearing  focal osseous lesions. Mild thoracic spondylosis. IMPRESSION: 1. Spectrum of findings compatible with basilar predominant fibrotic interstitial lung disease without frank honeycombing, mildly progressed in the interval. Findings are categorized as probable UIP per consensus guidelines: Diagnosis of Idiopathic Pulmonary Fibrosis: An Official ATS/ERS/JRS/ALAT Clinical Practice Guideline. [place], Iss 5, [phone], Aug 11 2017. 2. Three-vessel coronary atherosclerosis. 3. Stable ectatic 4.2 cm ascending thoracic aorta. Recommend annual imaging followup by CTA or MRA. This recommendation follows 2010 ACCF/AHA/AATS/ACR/ASA/SCA/SCAI/SIR/STS/SVM Guidelines for the Diagnosis and Management of Patients with Thoracic Aortic Disease. Circulation. 2010; 121: R424-O144. Aortic aneurysm NOS (ICD10-I71.9). 4. Stable mild mediastinal and bilateral hilar lymphadenopathy, most compatible with benign reactive adenopathy. 5. Aortic Atherosclerosis (ICD10-I70.0). Electronically Signed   By: Ilona Sorrel M.D.   On: 01/22/2021 14:56   ECHOCARDIOGRAM COMPLETE  Result Date: 01/28/2021    ECHOCARDIOGRAM REPORT   Patient Name:   Kristie [name]  Date of Exam: 01/28/2021 Medical Rec #:  [id number]     Height:       65.0 in Accession #:    [id number]    Weight:       174.8 lb Date of Birth:  28-Feb-1951      BSA:          1.868 m Patient Age:    64 years  BP:           112/62 mmHg Patient Gender: F             HR:           108 bpm. Exam Location:  [facility] Procedure: 2D Echo, 3D Echo, Cardiac Doppler, Color Doppler and Strain Analysis Indications:    R06.00 Dyspnea  History:        Patient has prior history of Echocardiogram examinations, most                 recent 01/22/2020. Pulmonary HTN, Signs/Symptoms:Shortness of                 Breath; Risk Factors:Hypertension. Interstitial lung disease.  Sonographer:    Jessee Avers, RDCS Referring Phys: [id number] [place]  1. Left ventricular ejection  fraction, by estimation, is 60 to 65%. Left ventricular ejection fraction by 3D volume is 58 %. The left ventricle has normal function. The left ventricle has no regional wall motion abnormalities. Left ventricular diastolic  parameters are consistent with Grade I diastolic dysfunction (impaired relaxation).  2. Right ventricular systolic function is normal. The right ventricular size is severely enlarged. There is severely elevated pulmonary artery systolic pressure. The estimated right ventricular systolic pressure is 65.4 mmHg.  3. Right atrial size was severely dilated.  4. The mitral valve is abnormal. Mild mitral valve regurgitation. No evidence of mitral stenosis.  5. Tricuspid valve regurgitation is severe.  6. The aortic valve is tricuspid. Aortic valve regurgitation is mild to moderate. No aortic stenosis is present.  7. Aortic dilatation noted. There is mild dilatation of the ascending aorta, measuring 42 mm. Comparison(s): A prior study was performed on 01/22/2020. Prior images reviewed side by side. Compated to prior increase in tricuspid regurgitation, ventricular size, and RVSP. FINDINGS  Left Ventricle: Left ventricular ejection fraction, by estimation, is 60 to 65%. Left ventricular ejection fraction by 3D volume is 58 %. The left ventricle has normal function. The left ventricle has no regional wall motion abnormalities. Global longitudinal strain performed but not reported based on interpreter judgement due to suboptimal tracking. The left ventricular internal cavity size was normal in size. There is no left ventricular hypertrophy. Left ventricular diastolic parameters are consistent with Grade I diastolic dysfunction (impaired relaxation). Right Ventricle: The right ventricular size is severely enlarged. No increase in right ventricular wall thickness. Right ventricular systolic function is normal. There is severely elevated pulmonary artery systolic pressure. The tricuspid regurgitant velocity  is 4.04 m/s, and with an assumed right atrial pressure of 8 mmHg, the estimated right ventricular systolic pressure is 65.0 mmHg. Left Atrium: Left atrial size was normal in size. Right Atrium: 103 cc manual volume. Right atrial size was severely dilated. Pericardium: There is no evidence of pericardial effusion. Mitral Valve: The mitral valve is abnormal. There is mild thickening of the mitral valve leaflet(s). Mild mitral annular calcification. Mild mitral valve regurgitation. No evidence of mitral valve stenosis. Tricuspid Valve: The tricuspid valve is grossly normal. Tricuspid valve regurgitation is severe. No evidence of tricuspid stenosis. Aortic Valve: The aortic valve is tricuspid. Aortic valve regurgitation is mild to moderate. Aortic regurgitation PHT measures 483 msec. No aortic stenosis is present. Pulmonic Valve: The pulmonic valve was normal in structure. Pulmonic valve regurgitation is mild. No evidence of pulmonic stenosis. Aorta: The aortic root is normal in size and structure and aortic dilatation noted. There is mild dilatation of the ascending aorta, measuring 42  mm. Pulmonary Artery: The pulmonary artery is of normal size. IAS/Shunts: The atrial septum is grossly normal.  LEFT VENTRICLE PLAX 2D LVIDd:         5.20 cm         Diastology LVIDs:         3.50 cm         LV e' medial:    5.50 cm/s LV PW:         1.00 cm         LV E/e' medial:  15.1 LV IVS:        1.00 cm         LV e' lateral:   8.66 cm/s LVOT diam:     2.00 cm         LV E/e' lateral: 9.6 LV SV:         67 LV SV Index:   36              2D LVOT Area:     3.14 cm        Longitudinal                                Strain                                2D Strain GLS  -16.3 %                                (A2C):                                2D Strain GLS  -15.6 %                                (A3C):                                2D Strain GLS  -15.4 %                                (A4C):                                2D Strain  GLS  -15.8 %                                Avg:                                 3D Volume EF                                LV 3D EF:    Left  ventricular                                             ejection                                             fraction by                                             3D volume                                             is 58 %.                                 3D Volume EF:                                3D EF:        58 %                                LV EDV:       106 ml                                LV ESV:       45 ml                                LV SV:        62 ml RIGHT VENTRICLE RV Basal diam:  4.10 cm RV S prime:     13.30 cm/s TAPSE (M-mode): 2.2 cm RVSP:           73.3 mmHg LEFT ATRIUM             Index       RIGHT ATRIUM           Index LA diam:        4.40 cm 2.36 cm/m  RA Pressure: 8.00 mmHg LA Vol (A2C):   30.2 ml 16.17 ml/m RA Area:     25.60 cm LA Vol (A4C):   27.0 ml 14.45 ml/m RA Volume:   85.80 ml  45.93 ml/m LA Biplane Vol: 29.6 ml 15.85 ml/m  AORTIC VALVE LVOT Vmax:   116.00 cm/s LVOT Vmean:  72.700 cm/s LVOT VTI:    0.212 m AI PHT:      483 msec  AORTA Ao Root diam: 3.50 cm Ao Asc diam:  4.15 cm MITRAL VALVE                TRICUSPID VALVE  TR Peak grad:   65.3 mmHg                             TR Vmax:        404.00 cm/s MV E velocity: 83.10 cm/s   Estimated RAP:  8.00 mmHg MV A velocity: 110.00 cm/s  RVSP:           73.3 mmHg MV E/A ratio:  0.76                             SHUNTS                             Systemic VTI:  0.21 m                             Systemic Diam: 2.00 cm Rudean Haskell MD Electronically signed by Rudean Haskell MD Signature Date/Time: 01/28/2021/4:04:52 PM    Final     Lab Results: Personally reviewed, notably eosinophils as high as 700 12/2020 CBC    Component Value Date/Time   WBC 13.0 (H) 01/05/2021 1212   RBC 3.59 (L) 01/05/2021  1212   HGB 10.9 (L) 01/05/2021 1212   HGB 13.2 03/26/2020 1425   HCT 33.5 (L) 01/05/2021 1212   PLT 453.0 (H) 01/05/2021 1212   PLT 431 (H) 03/26/2020 1425   MCV 93.6 01/05/2021 1212   MCH 29.1 03/26/2020 1425   MCHC 32.5 01/05/2021 1212   RDW 15.4 01/05/2021 1212   LYMPHSABS 1.9 01/05/2021 1212   MONOABS 1.1 (H) 01/05/2021 1212   EOSABS 0.7 01/05/2021 1212   BASOSABS 0.1 01/05/2021 1212    BMET    Component Value Date/Time   NA 136 03/26/2020 1425   K 3.8 03/26/2020 1425   CL 106 03/26/2020 1425   CO2 21 (L) 03/26/2020 1425   GLUCOSE 105 (H) 03/26/2020 1425   BUN 13 03/26/2020 1425   CREATININE 0.86 03/26/2020 1425   CALCIUM 8.9 03/26/2020 1425   GFRNONAA >60 03/26/2020 1425   GFRAA >60 03/26/2020 1425    BNP No results found for: BNP  ProBNP    Component Value Date/Time   PROBNP 139.0 (H) 05/29/2019 1118    Specialty Problems      Pulmonary Problems   Allergic rhinitis    Qualifier: Diagnosis of  By: Loanne Drilling MD, Sean A       Chronic cough    Onset 2005 intermittent and evolved to chronic cough Jan 2020 on fosfamax -  D/c fosfamax and symb 160 05/29/2019 and max rx for reflux > resolved 07/02/2019       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 Jan 2020  - 05/29/2019   Walked RA  2 laps @  approx 215f each @ fast pace  stopped due to end of study c/o sob and sats down to 92%   - collagen vasc profile sent 05/29/2019 >  Pos for ANA with speckled pattern 1: 1280 and RA 19  - 07/02/2019   Walked RA  2 laps @  approx 2527feach @ avg pace  stopped due to  End of study with sats 91% at sob at end   - HRCT 07/24/2019 1. The appearance of the lungs remains  compatible with interstitial lung disease, with a spectrum of findings considered probable usual interstitial pneumonia (UIP) per current ATS guidelines. However, given the stability compared to the prior examination, the possibility of chronic fibrotic phase  nonspecific interstitial pneumonia (NSIP) warrants consideration. - Rheum eval 07/16/19 by Marella Chimes PA with multiple studies sent       DOE (dyspnea on exertion)    Onset around 2015 assoc with PF   09/18/2019   Walked RA x one lap =  approx 250 ft - stopped due to  Sob/back pain with sats 91% at end at very slow pace / freq stops      ILD (interstitial lung disease) ([facility])   Chronic respiratory failure with hypoxia (HCC)      Allergies  Allergen Reactions   Penicillins Itching and Other (See Comments)    "Cillin" Family - Amoxicillin - itching    Did it involve swelling of the face/tongue/throat, SOB, or low BP? Yes Did it involve sudden or severe rash/hives, skin peeling, or any reaction on the inside of your mouth or nose? no Did you need to seek medical attention at a hospital or doctor's office? yes When did it last happen?30 years If all above answers are NO, may proceed with cephalosporin use.   Doxycycline Rash    Immunization History  Administered Date(s) Administered   Fluad Quad(high Dose 65+) 11/10/2020   Influenza, High Dose Seasonal PF 08/29/2019   Moderna Sars-Covid-2 Vaccination 01/01/2020, 01/26/2020, 11/03/2020    Past Medical History:  Diagnosis Date   Anxiety    Bronchitis    Fibromyalgia    Hypertension    PMH: 2010   Hypothyroidism    Seasonal allergies    Spinal stenosis    L-5 to S-1   Thyroid disease    Wears glasses     Tobacco History: Social History   Tobacco Use  Smoking Status Never Smoker  Smokeless Tobacco Never Used   Counseling given: Not Answered   Continue to not smoke  Outpatient Encounter Medications as of 02/15/2021  Medication Sig   albuterol (PROVENTIL) (2.5 MG/3ML) 0.083% nebulizer solution Take 2.5 mg by nebulization every 4 (four) hours as needed for wheezing or shortness of breath.    Ascorbic Acid (VITAMIN C) 100 MG tablet Take 100 mg by mouth daily.   aspirin EC 81 MG tablet Take 81  mg by mouth daily.   atorvastatin (LIPITOR) 80 MG tablet Take 80 mg by mouth daily.    azaTHIOprine (IMURAN) 50 MG tablet Take 146m daily   Calcium 250 MG CAPS Take 500 mg by mouth 2 (two) times daily.   citalopram (CELEXA) 40 MG tablet Take 40 mg by mouth at bedtime.    famotidine (PEPCID) 20 MG tablet One after supper   gabapentin (NEURONTIN) 100 MG capsule    hydrOXYzine (ATARAX/VISTARIL) 25 MG tablet Take 25 mg by mouth at bedtime.    levothyroxine (SYNTHROID, LEVOTHROID) 125 MCG tablet Take 125 mcg by mouth daily before breakfast.   loratadine-pseudoephedrine (CLARITIN-D 24-HOUR) 10-240 MG 24 hr tablet Take 1 tablet by mouth daily.   Magnesium 250 MG TABS Take 1 tablet by mouth at bedtime.   metoprolol succinate (TOPROL-XL) 25 MG 24 hr tablet Take 25 mg by mouth daily.    montelukast (SINGULAIR) 10 MG tablet Take 10 mg by mouth daily.    morphine (MSIR) 15 MG tablet Take 15 mg by mouth 3 (three) times daily as needed for moderate pain or severe pain.  Multiple Vitamin (MULTIVITAMIN WITH MINERALS) TABS tablet Take 1 tablet by mouth daily.   nitroGLYCERIN (NITROSTAT) 0.4 MG SL tablet Place 0.4 mg under the tongue every 5 (five) minutes as needed for chest pain. AS NEEDED   nystatin (MYCOSTATIN) 100000 UNIT/ML suspension Take 5 mLs (500,000 Units total) by mouth 4 (four) times daily.   omeprazole (PRILOSEC OTC) 20 MG tablet Prilosec OTC 20 mg tablet,delayed release   ondansetron (ZOFRAN ODT) 4 MG disintegrating tablet Take 1 tablet (4 mg total) by mouth every 8 (eight) hours as needed for nausea or vomiting.   pantoprazole (PROTONIX) 40 MG tablet TAKE 1 TABLET BY MOUTH DAILY 30- 60 MINUTES BEFORE FIRST MEAL OF THE DAY   pimecrolimus (ELIDEL) 1 % cream Apply 1 application topically 2 (two) times daily.   sulfamethoxazole-trimethoprim (BACTRIM DS) 800-160 MG tablet Take 1 tablet by mouth 3 (three) times a week.   tiZANidine (ZANAFLEX) 4 MG capsule Take 1 capsule (4 mg  total) by mouth 3 (three) times daily. (Patient taking differently: Take 4 mg by mouth 3 (three) times daily as needed.)   VITAMIN D, CHOLECALCIFEROL, PO Take 1 tablet by mouth daily.   [DISCONTINUED] ibuprofen (ADVIL) 800 MG tablet Take 800 mg by mouth daily as needed for mild pain or moderate pain.    No facility-administered encounter medications on file as of 02/15/2021.     Review of Systems  Review of Systems  No chest pain with exertion.  No orthopnea or PND.  No lower semiswelling.  Comprehensive review of systems otherwise negative.  Physical Exam  BP 124/82    Pulse 86    Temp 98.1 F (36.7 C) (Temporal)    Ht _0  (1.651 m)    Wt 171 lb 9.6 oz (77.8 kg)    SpO2 98% Comment: on 4L   BMI 28.56 kg/m   Wt Readings from Last 5 Encounters:  02/15/21 171 lb 9.6 oz (77.8 kg)  01/05/21 174 lb 12.8 oz (79.3 kg)  11/22/20 167 lb 12.8 oz (76.1 kg)  08/03/20 176 lb (79.8 kg)  04/19/20 178 lb 9.6 oz (81 kg)    BMI Readings from Last 5 Encounters:  02/15/21 28.56 kg/m  01/05/21 29.09 kg/m  11/22/20 28.36 kg/m  08/03/20 30.21 kg/m  04/19/20 29.72 kg/m     Physical Exam General: Well-appearing, no acute distress Eyes: EOMI, no icterus Neck: Supple, no JVP Pulmonary: Very fine crackles in bilateral bases to midlung fields, otherwise clear, normal work of breathing on oxygen Cardiovascular: Regular rate and rhythm, no murmurs Abdomen: Nondistended, bowel sounds present MSK: No synovitis, joint effusion Neuro: No weakness, sensation intact Psych: Depressed mood, flat affect   Assessment & Plan:   Dyspnea on exertion: Likely multifactorial with accommodation of interstitial lung disease and restrictive physiology as well likely pulmonary hypertension based on cardiogram.  Aggressively being treated for ILD per Dr. Vaughan Browner with recent increase in Imuran.  Sounds like discussing antifibrotic's.  Mild worsening of images from 20 21-20 22.  Echocardiogram also suggests  worsening RV pressure overload.  To begin pulmonary hypertension evaluation by me.  Please see below.  Likely pulmonary hypertension: Suspect contributions largely from group 3 disease given hypoxemia, interstitial lung disease.  Likely component of Group 1 PAH overlap given connective tissue disease.  Will screen for modifiable risk factors including polysomnography to see if OSA is present and can be treated, VQ scan to evaluate for possible CHF although this is felt less likely given lack of history for PE.  Will need right heart cath.  The timing of which depends on results of these studies.  If OSA present, would favor 3 months of treatment before further evaluation.  Tentatively will plan to institute [name] if pulmonary hypertension is confirmed given significant group 3 disease and parenchymal disease and effort to minimize VQ mismatch.  If tolerates well will consider oral medication.  Fortunately, her RV function is intact at this time.   Return in about 4 weeks (around 03/15/2021).   Lanier Clam, MD 02/16/2021   This appointment required 90 minutes of patient care (this includes precharting, chart review, review of results, face-to-face care, etc.).

## 2021-02-18 ENCOUNTER — Telehealth: Payer: Self-pay | Admitting: Pulmonary Disease

## 2021-02-18 NOTE — Telephone Encounter (Signed)
Spoke with pt  She was seen by Dr Silas Flood on 02/15/21 for eval of PAH after Dr Vaughan Browner reviewed her ECHO She has appt with Dr Vaughan Browner scheduled for 02/23/21 for followup  She is asking if she still needs to keep appt 3/16  She is scheduled for VQ scan on 02/21/21 and sleep study for 04/03/21 Please advise thanks

## 2021-02-21 ENCOUNTER — Ambulatory Visit (HOSPITAL_COMMUNITY)
Admission: RE | Admit: 2021-02-21 | Discharge: 2021-02-21 | Disposition: A | Discharge status: Home / Self Care | Payer: Medicare PPO | Source: Ambulatory Visit | Attending: Pulmonary Disease | Admitting: Pulmonary Disease

## 2021-02-21 ENCOUNTER — Other Ambulatory Visit: Payer: Self-pay

## 2021-02-21 DIAGNOSIS — R0609 Other forms of dyspnea: Secondary | ICD-10-CM

## 2021-02-21 DIAGNOSIS — J8410 Pulmonary fibrosis, unspecified: Secondary | ICD-10-CM | POA: Diagnosis not present

## 2021-02-21 DIAGNOSIS — R0600 Dyspnea, unspecified: Secondary | ICD-10-CM | POA: Insufficient documentation

## 2021-02-21 DIAGNOSIS — I272 Pulmonary hypertension, unspecified: Secondary | ICD-10-CM | POA: Diagnosis not present

## 2021-02-21 DIAGNOSIS — R0602 Shortness of breath: Secondary | ICD-10-CM | POA: Diagnosis not present

## 2021-02-21 MED ORDER — TECHNETIUM TO 99M ALBUMIN AGGREGATED
4.2000 | Freq: Once | INTRAVENOUS | Status: AC | PRN
  Administered 2021-02-21: 4.2 via INTRAVENOUS

## 2021-02-21 NOTE — Telephone Encounter (Signed)
We can cancel the 3/16 appointment

## 2021-02-21 NOTE — Telephone Encounter (Signed)
Spoke with the pt and notified ok to cancel appt for 3/16  Appt cancelled  Nothing further needed per pt

## 2021-02-23 ENCOUNTER — Ambulatory Visit: Payer: Medicare PPO | Admitting: Pulmonary Disease

## 2021-03-07 ENCOUNTER — Other Ambulatory Visit: Payer: Self-pay | Admitting: Pulmonary Disease

## 2021-03-15 DIAGNOSIS — R579 Shock, unspecified: Secondary | ICD-10-CM | POA: Diagnosis not present

## 2021-03-15 DIAGNOSIS — I959 Hypotension, unspecified: Secondary | ICD-10-CM | POA: Diagnosis not present

## 2021-03-15 DIAGNOSIS — I5033 Acute on chronic diastolic (congestive) heart failure: Secondary | ICD-10-CM | POA: Diagnosis not present

## 2021-03-15 DIAGNOSIS — E872 Acidosis: Secondary | ICD-10-CM | POA: Diagnosis not present

## 2021-03-15 DIAGNOSIS — J9621 Acute and chronic respiratory failure with hypoxia: Secondary | ICD-10-CM | POA: Diagnosis not present

## 2021-03-15 DIAGNOSIS — I517 Cardiomegaly: Secondary | ICD-10-CM | POA: Diagnosis not present

## 2021-03-15 DIAGNOSIS — N289 Disorder of kidney and ureter, unspecified: Secondary | ICD-10-CM | POA: Diagnosis not present

## 2021-03-15 DIAGNOSIS — I2699 Other pulmonary embolism without acute cor pulmonale: Secondary | ICD-10-CM | POA: Diagnosis not present

## 2021-03-15 DIAGNOSIS — Z9911 Dependence on respirator [ventilator] status: Secondary | ICD-10-CM | POA: Diagnosis not present

## 2021-03-15 DIAGNOSIS — G4489 Other headache syndrome: Secondary | ICD-10-CM | POA: Diagnosis not present

## 2021-03-15 DIAGNOSIS — K3189 Other diseases of stomach and duodenum: Secondary | ICD-10-CM | POA: Diagnosis not present

## 2021-03-15 DIAGNOSIS — J45909 Unspecified asthma, uncomplicated: Secondary | ICD-10-CM | POA: Diagnosis not present

## 2021-03-15 DIAGNOSIS — I340 Nonrheumatic mitral (valve) insufficiency: Secondary | ICD-10-CM | POA: Diagnosis not present

## 2021-03-15 DIAGNOSIS — I071 Rheumatic tricuspid insufficiency: Secondary | ICD-10-CM | POA: Diagnosis not present

## 2021-03-15 DIAGNOSIS — Z4682 Encounter for fitting and adjustment of non-vascular catheter: Secondary | ICD-10-CM | POA: Diagnosis not present

## 2021-03-15 DIAGNOSIS — R6521 Severe sepsis with septic shock: Secondary | ICD-10-CM | POA: Diagnosis not present

## 2021-03-15 DIAGNOSIS — R918 Other nonspecific abnormal finding of lung field: Secondary | ICD-10-CM | POA: Diagnosis not present

## 2021-03-15 DIAGNOSIS — R0603 Acute respiratory distress: Secondary | ICD-10-CM | POA: Diagnosis not present

## 2021-03-15 DIAGNOSIS — K859 Acute pancreatitis without necrosis or infection, unspecified: Secondary | ICD-10-CM | POA: Diagnosis not present

## 2021-03-15 DIAGNOSIS — B370 Candidal stomatitis: Secondary | ICD-10-CM | POA: Diagnosis not present

## 2021-03-15 DIAGNOSIS — J8410 Pulmonary fibrosis, unspecified: Secondary | ICD-10-CM | POA: Diagnosis not present

## 2021-03-15 DIAGNOSIS — N179 Acute kidney failure, unspecified: Secondary | ICD-10-CM | POA: Diagnosis not present

## 2021-03-15 DIAGNOSIS — M3500 Sicca syndrome, unspecified: Secondary | ICD-10-CM | POA: Diagnosis not present

## 2021-03-15 DIAGNOSIS — A419 Sepsis, unspecified organism: Secondary | ICD-10-CM | POA: Diagnosis not present

## 2021-03-15 DIAGNOSIS — N170 Acute kidney failure with tubular necrosis: Secondary | ICD-10-CM | POA: Diagnosis not present

## 2021-03-15 DIAGNOSIS — R1084 Generalized abdominal pain: Secondary | ICD-10-CM | POA: Diagnosis not present

## 2021-03-15 DIAGNOSIS — J849 Interstitial pulmonary disease, unspecified: Secondary | ICD-10-CM | POA: Diagnosis not present

## 2021-03-15 DIAGNOSIS — K219 Gastro-esophageal reflux disease without esophagitis: Secondary | ICD-10-CM | POA: Diagnosis not present

## 2021-03-15 DIAGNOSIS — J9601 Acute respiratory failure with hypoxia: Secondary | ICD-10-CM | POA: Diagnosis not present

## 2021-03-15 DIAGNOSIS — Z743 Need for continuous supervision: Secondary | ICD-10-CM | POA: Diagnosis not present

## 2021-03-15 DIAGNOSIS — D509 Iron deficiency anemia, unspecified: Secondary | ICD-10-CM | POA: Diagnosis not present

## 2021-03-15 DIAGNOSIS — R561 Post traumatic seizures: Secondary | ICD-10-CM | POA: Diagnosis not present

## 2021-03-15 DIAGNOSIS — J811 Chronic pulmonary edema: Secondary | ICD-10-CM | POA: Diagnosis not present

## 2021-03-15 DIAGNOSIS — J9620 Acute and chronic respiratory failure, unspecified whether with hypoxia or hypercapnia: Secondary | ICD-10-CM | POA: Diagnosis not present

## 2021-03-15 DIAGNOSIS — I272 Pulmonary hypertension, unspecified: Secondary | ICD-10-CM | POA: Diagnosis not present

## 2021-03-15 DIAGNOSIS — M549 Dorsalgia, unspecified: Secondary | ICD-10-CM | POA: Diagnosis not present

## 2021-03-15 DIAGNOSIS — R935 Abnormal findings on diagnostic imaging of other abdominal regions, including retroperitoneum: Secondary | ICD-10-CM | POA: Diagnosis not present

## 2021-03-15 DIAGNOSIS — R279 Unspecified lack of coordination: Secondary | ICD-10-CM | POA: Diagnosis not present

## 2021-03-15 DIAGNOSIS — I130 Hypertensive heart and chronic kidney disease with heart failure and stage 1 through stage 4 chronic kidney disease, or unspecified chronic kidney disease: Secondary | ICD-10-CM | POA: Diagnosis not present

## 2021-03-15 DIAGNOSIS — Z9884 Bariatric surgery status: Secondary | ICD-10-CM | POA: Diagnosis not present

## 2021-03-15 DIAGNOSIS — I351 Nonrheumatic aortic (valve) insufficiency: Secondary | ICD-10-CM | POA: Diagnosis not present

## 2021-03-15 DIAGNOSIS — J9602 Acute respiratory failure with hypercapnia: Secondary | ICD-10-CM | POA: Diagnosis not present

## 2021-03-15 DIAGNOSIS — J9811 Atelectasis: Secondary | ICD-10-CM | POA: Diagnosis not present

## 2021-03-15 DIAGNOSIS — I252 Old myocardial infarction: Secondary | ICD-10-CM | POA: Diagnosis not present

## 2021-03-15 DIAGNOSIS — J9690 Respiratory failure, unspecified, unspecified whether with hypoxia or hypercapnia: Secondary | ICD-10-CM | POA: Diagnosis not present

## 2021-03-15 DIAGNOSIS — I428 Other cardiomyopathies: Secondary | ICD-10-CM | POA: Diagnosis not present

## 2021-03-15 DIAGNOSIS — R4182 Altered mental status, unspecified: Secondary | ICD-10-CM | POA: Diagnosis not present

## 2021-03-15 DIAGNOSIS — N390 Urinary tract infection, site not specified: Secondary | ICD-10-CM | POA: Diagnosis not present

## 2021-03-15 DIAGNOSIS — I2781 Cor pulmonale (chronic): Secondary | ICD-10-CM | POA: Diagnosis not present

## 2021-03-15 DIAGNOSIS — R5381 Other malaise: Secondary | ICD-10-CM | POA: Diagnosis not present

## 2021-03-15 DIAGNOSIS — R109 Unspecified abdominal pain: Secondary | ICD-10-CM | POA: Diagnosis not present

## 2021-03-15 DIAGNOSIS — J90 Pleural effusion, not elsewhere classified: Secondary | ICD-10-CM | POA: Diagnosis not present

## 2021-03-15 DIAGNOSIS — R7989 Other specified abnormal findings of blood chemistry: Secondary | ICD-10-CM | POA: Diagnosis not present

## 2021-03-15 DIAGNOSIS — I2721 Secondary pulmonary arterial hypertension: Secondary | ICD-10-CM | POA: Diagnosis not present

## 2021-03-15 DIAGNOSIS — R110 Nausea: Secondary | ICD-10-CM | POA: Diagnosis not present

## 2021-03-15 DIAGNOSIS — J941 Fibrothorax: Secondary | ICD-10-CM | POA: Diagnosis not present

## 2021-03-15 DIAGNOSIS — M852 Hyperostosis of skull: Secondary | ICD-10-CM | POA: Diagnosis not present

## 2021-03-17 DIAGNOSIS — I272 Pulmonary hypertension, unspecified: Secondary | ICD-10-CM

## 2021-03-17 DIAGNOSIS — I340 Nonrheumatic mitral (valve) insufficiency: Secondary | ICD-10-CM

## 2021-03-17 DIAGNOSIS — I351 Nonrheumatic aortic (valve) insufficiency: Secondary | ICD-10-CM

## 2021-03-18 DIAGNOSIS — E785 Hyperlipidemia, unspecified: Secondary | ICD-10-CM | POA: Diagnosis not present

## 2021-03-18 DIAGNOSIS — J9621 Acute and chronic respiratory failure with hypoxia: Secondary | ICD-10-CM | POA: Diagnosis not present

## 2021-03-18 DIAGNOSIS — R579 Shock, unspecified: Secondary | ICD-10-CM | POA: Diagnosis not present

## 2021-03-18 DIAGNOSIS — Z452 Encounter for adjustment and management of vascular access device: Secondary | ICD-10-CM | POA: Diagnosis not present

## 2021-03-18 DIAGNOSIS — I130 Hypertensive heart and chronic kidney disease with heart failure and stage 1 through stage 4 chronic kidney disease, or unspecified chronic kidney disease: Secondary | ICD-10-CM | POA: Diagnosis not present

## 2021-03-18 DIAGNOSIS — J90 Pleural effusion, not elsewhere classified: Secondary | ICD-10-CM | POA: Diagnosis not present

## 2021-03-18 DIAGNOSIS — I2729 Other secondary pulmonary hypertension: Secondary | ICD-10-CM | POA: Diagnosis not present

## 2021-03-18 DIAGNOSIS — J849 Interstitial pulmonary disease, unspecified: Secondary | ICD-10-CM | POA: Diagnosis not present

## 2021-03-18 DIAGNOSIS — K761 Chronic passive congestion of liver: Secondary | ICD-10-CM | POA: Diagnosis not present

## 2021-03-18 DIAGNOSIS — D72829 Elevated white blood cell count, unspecified: Secondary | ICD-10-CM | POA: Diagnosis not present

## 2021-03-18 DIAGNOSIS — M7732 Calcaneal spur, left foot: Secondary | ICD-10-CM | POA: Diagnosis not present

## 2021-03-18 DIAGNOSIS — I50812 Chronic right heart failure: Secondary | ICD-10-CM | POA: Diagnosis not present

## 2021-03-18 DIAGNOSIS — I252 Old myocardial infarction: Secondary | ICD-10-CM | POA: Diagnosis not present

## 2021-03-18 DIAGNOSIS — I110 Hypertensive heart disease with heart failure: Secondary | ICD-10-CM | POA: Diagnosis not present

## 2021-03-18 DIAGNOSIS — E872 Acidosis: Secondary | ICD-10-CM | POA: Diagnosis not present

## 2021-03-18 DIAGNOSIS — Z4659 Encounter for fitting and adjustment of other gastrointestinal appliance and device: Secondary | ICD-10-CM | POA: Diagnosis not present

## 2021-03-18 DIAGNOSIS — J9611 Chronic respiratory failure with hypoxia: Secondary | ICD-10-CM | POA: Diagnosis not present

## 2021-03-18 DIAGNOSIS — M7731 Calcaneal spur, right foot: Secondary | ICD-10-CM | POA: Diagnosis not present

## 2021-03-18 DIAGNOSIS — Z4682 Encounter for fitting and adjustment of non-vascular catheter: Secondary | ICD-10-CM | POA: Diagnosis not present

## 2021-03-18 DIAGNOSIS — R1312 Dysphagia, oropharyngeal phase: Secondary | ICD-10-CM | POA: Diagnosis not present

## 2021-03-18 DIAGNOSIS — I2721 Secondary pulmonary arterial hypertension: Secondary | ICD-10-CM | POA: Diagnosis not present

## 2021-03-18 DIAGNOSIS — I272 Pulmonary hypertension, unspecified: Secondary | ICD-10-CM | POA: Diagnosis not present

## 2021-03-18 DIAGNOSIS — K859 Acute pancreatitis without necrosis or infection, unspecified: Secondary | ICD-10-CM | POA: Diagnosis not present

## 2021-03-18 DIAGNOSIS — J811 Chronic pulmonary edema: Secondary | ICD-10-CM | POA: Diagnosis not present

## 2021-03-18 DIAGNOSIS — J8410 Pulmonary fibrosis, unspecified: Secondary | ICD-10-CM | POA: Diagnosis not present

## 2021-03-18 DIAGNOSIS — J9601 Acute respiratory failure with hypoxia: Secondary | ICD-10-CM | POA: Diagnosis not present

## 2021-03-18 DIAGNOSIS — M3500 Sicca syndrome, unspecified: Secondary | ICD-10-CM | POA: Diagnosis not present

## 2021-03-18 DIAGNOSIS — I2781 Cor pulmonale (chronic): Secondary | ICD-10-CM | POA: Diagnosis not present

## 2021-03-18 DIAGNOSIS — I5033 Acute on chronic diastolic (congestive) heart failure: Secondary | ICD-10-CM | POA: Diagnosis not present

## 2021-03-18 DIAGNOSIS — M7989 Other specified soft tissue disorders: Secondary | ICD-10-CM | POA: Diagnosis not present

## 2021-03-18 DIAGNOSIS — I50810 Right heart failure, unspecified: Secondary | ICD-10-CM | POA: Diagnosis not present

## 2021-03-18 DIAGNOSIS — K6389 Other specified diseases of intestine: Secondary | ICD-10-CM | POA: Diagnosis not present

## 2021-03-18 DIAGNOSIS — E039 Hypothyroidism, unspecified: Secondary | ICD-10-CM | POA: Diagnosis not present

## 2021-03-18 DIAGNOSIS — I361 Nonrheumatic tricuspid (valve) insufficiency: Secondary | ICD-10-CM | POA: Diagnosis not present

## 2021-03-18 DIAGNOSIS — J45909 Unspecified asthma, uncomplicated: Secondary | ICD-10-CM | POA: Diagnosis not present

## 2021-03-18 DIAGNOSIS — Z955 Presence of coronary angioplasty implant and graft: Secondary | ICD-10-CM | POA: Diagnosis not present

## 2021-03-18 DIAGNOSIS — N179 Acute kidney failure, unspecified: Secondary | ICD-10-CM | POA: Diagnosis not present

## 2021-03-18 DIAGNOSIS — R918 Other nonspecific abnormal finding of lung field: Secondary | ICD-10-CM | POA: Diagnosis not present

## 2021-03-18 DIAGNOSIS — I428 Other cardiomyopathies: Secondary | ICD-10-CM | POA: Diagnosis not present

## 2021-03-18 DIAGNOSIS — R7401 Elevation of levels of liver transaminase levels: Secondary | ICD-10-CM | POA: Diagnosis not present

## 2021-03-18 DIAGNOSIS — D509 Iron deficiency anemia, unspecified: Secondary | ICD-10-CM | POA: Diagnosis not present

## 2021-03-18 DIAGNOSIS — I25118 Atherosclerotic heart disease of native coronary artery with other forms of angina pectoris: Secondary | ICD-10-CM | POA: Diagnosis not present

## 2021-03-18 DIAGNOSIS — I2510 Atherosclerotic heart disease of native coronary artery without angina pectoris: Secondary | ICD-10-CM | POA: Diagnosis not present

## 2021-03-18 DIAGNOSIS — Z79899 Other long term (current) drug therapy: Secondary | ICD-10-CM | POA: Diagnosis not present

## 2021-03-18 DIAGNOSIS — T82855A Stenosis of coronary artery stent, initial encounter: Secondary | ICD-10-CM | POA: Diagnosis not present

## 2021-03-18 DIAGNOSIS — I6032 Nontraumatic subarachnoid hemorrhage from left posterior communicating artery: Secondary | ICD-10-CM | POA: Diagnosis not present

## 2021-03-29 ENCOUNTER — Telehealth: Payer: Self-pay | Admitting: Pulmonary Disease

## 2021-03-29 NOTE — Telephone Encounter (Signed)
I have called the patient and the sleep lab to cancel the appt

## 2021-04-01 DIAGNOSIS — N189 Chronic kidney disease, unspecified: Secondary | ICD-10-CM | POA: Diagnosis not present

## 2021-04-01 DIAGNOSIS — I5033 Acute on chronic diastolic (congestive) heart failure: Secondary | ICD-10-CM | POA: Diagnosis not present

## 2021-04-01 DIAGNOSIS — M3500 Sicca syndrome, unspecified: Secondary | ICD-10-CM | POA: Diagnosis not present

## 2021-04-01 DIAGNOSIS — J9612 Chronic respiratory failure with hypercapnia: Secondary | ICD-10-CM | POA: Diagnosis not present

## 2021-04-01 DIAGNOSIS — D631 Anemia in chronic kidney disease: Secondary | ICD-10-CM | POA: Diagnosis not present

## 2021-04-01 DIAGNOSIS — I130 Hypertensive heart and chronic kidney disease with heart failure and stage 1 through stage 4 chronic kidney disease, or unspecified chronic kidney disease: Secondary | ICD-10-CM | POA: Diagnosis not present

## 2021-04-01 DIAGNOSIS — M329 Systemic lupus erythematosus, unspecified: Secondary | ICD-10-CM | POA: Diagnosis not present

## 2021-04-01 DIAGNOSIS — J8410 Pulmonary fibrosis, unspecified: Secondary | ICD-10-CM | POA: Diagnosis not present

## 2021-04-01 DIAGNOSIS — I50810 Right heart failure, unspecified: Secondary | ICD-10-CM | POA: Diagnosis not present

## 2021-04-04 ENCOUNTER — Encounter (HOSPITAL_BASED_OUTPATIENT_CLINIC_OR_DEPARTMENT_OTHER): Payer: Medicare PPO | Admitting: Pulmonary Disease

## 2021-04-04 ENCOUNTER — Other Ambulatory Visit: Payer: Self-pay

## 2021-04-04 ENCOUNTER — Ambulatory Visit: Payer: Medicare PPO | Admitting: Primary Care

## 2021-04-04 ENCOUNTER — Ambulatory Visit (HOSPITAL_BASED_OUTPATIENT_CLINIC_OR_DEPARTMENT_OTHER): Payer: Medicare PPO | Admitting: Pulmonary Disease

## 2021-04-04 DIAGNOSIS — J849 Interstitial pulmonary disease, unspecified: Secondary | ICD-10-CM

## 2021-04-04 DIAGNOSIS — R0683 Snoring: Secondary | ICD-10-CM | POA: Diagnosis not present

## 2021-04-04 DIAGNOSIS — I272 Pulmonary hypertension, unspecified: Secondary | ICD-10-CM

## 2021-04-04 MED ORDER — BENZONATATE 100 MG PO CAPS: 100.0000 mg | ORAL_CAPSULE | Freq: Three times a day (TID) | ORAL | 1 refills | Status: DC | PRN

## 2021-04-04 NOTE — Patient Instructions (Signed)
Recommendations: We will follow-up after sleep study to review results and treatment Continue 2L oxygen at rest and 3L on e Use albuterol nebulizer morning and evening as needed for cough/shortness of breath  Sending in Rx for tesslon perles which you can take every 6-8 hours for cough  Continue incentive spirometer 5-10 deep breaths every hour while awake   Follow-up: 8-12 weeks with Dr. Silas Flood or sooner if needed    Pulmonary Hypertension Pulmonary hypertension is a long-term (chronic) condition in which there is high blood pressure in the arteries in the lungs (pulmonary arteries). This condition occurs when pulmonary arteries become narrow and tight, making it harder for blood to flow through the lungs. This in turn makes the heart work harder to pump blood through the lungs, making it harder for you to breathe. Over time, pulmonary hypertension can weaken and damage the heart muscle, specifically the right side of the heart. Pulmonary hypertension is a serious condition that can be life-threatening. What are the causes? This condition may be caused by different medical conditions. It can be categorized by cause into five groups:  Group 1: Pulmonary hypertension that is caused by abnormal growth of small blood vessels in the lungs (pulmonary arterial hypertension). The abnormal blood vessel growth may have no known cause, or it may be: ? Passed from parent to child (hereditary). ? Caused by another disease, such as a connective tissue disease (including lupus or scleroderma), congenital heart disease, liver disease, or HIV. ? Caused by certain medicines or poisons (toxins).  Group 2: Pulmonary hypertension that is caused by weakness of the left chamber of the heart (left ventricle) or heart valve disease.  Group 3: Pulmonary hypertension that is caused by lung disease or low oxygen levels. Causes in this group include: ? Emphysema or chronic obstructive pulmonary disease  (COPD). ? Untreated sleep apnea. ? Pulmonary fibrosis. ? Long-term exposure to high altitudes in certain people who may already be at higher risk for pulmonary hypertension.  Group 4: Pulmonary hypertension that is caused by blood clots in the lungs (pulmonary emboli).  Group 5: Other causes of pulmonary hypertension, such as sickle cell anemia, sarcoidosis, tumors pressing on the pulmonary arteries, and various other diseases. What are the signs or symptoms? Symptoms of this condition include:  Shortness of breath. You may notice shortness of breath with: ? Activity, such as walking. ? Minimal activity, such as getting dressed. ? No activity, like when you are sitting still.  A cough. Sometimes, bloody mucus from the lungs may be coughed up (hemoptysis).  Tiredness and fatigue.  Dizziness, lightheadedness, or fainting, especially with physical activity.  Rapid heartbeat, or feeling your heart flutter or skip a beat (palpitations).  Veins in the neck getting larger.  Swelling of the lower legs, abdomen, or both.  Bluish color of the lips and fingertips.  Chest pain or tightness in the chest.  Abdominal pain, especially in the upper abdomen. How is this diagnosed? This condition may be diagnosed based on one or more of the following tests:  Chest X-ray.  Blood tests.  CT scan.  Pulmonary function test. This test measures how much air your lungs can hold. It also tests how well air moves in and out of your lungs.  6-minute walk test. This tests how severe your condition is in relation to your activity levels.  Electrocardiogram (ECG). This test records the electrical impulses of the heart.  Echocardiogram. This test uses sound waves (ultrasound) to produce an image of the  heart.  Cardiac catheterization. This is a procedure in which a thin tube (catheter) is passed into the pulmonary artery and used to test the pressure in your pulmonary artery and the right side of  your heart.  Lung biopsy. This involves having a procedure to remove a small sample of lung tissue for testing. This may help determine an underlying cause of your pulmonary hypertension. How is this treated? There is no cure for this condition, but treatment can help to relieve symptoms and slow the progress of the condition. Treatment may include:  Cardiac rehabilitation. This is a treatment program that includes exercise training, education, and counseling to help you get stronger and return to an active lifestyle.  Oxygen therapy.  Medicines that: ? Lower blood pressure. ? Relax (dilate) the pulmonary blood vessels. ? Help the heart beat more efficiently and pump more blood. ? Help the body get rid of extra fluid (diuretics). ? Thin the blood in order to prevent blood clots in the lungs.  Lung surgery to relieve pressure on the heart, for severe cases that do not respond to medical treatment.  Heart-lung transplant, or lung transplant. This may be done in very severe cases. Follow these instructions at home: Eating and drinking  Eat a healthy diet that includes plenty of fresh fruits and vegetables, whole grains, and beans.  Limit your salt (sodium) intake to less than 2,300 mg a day.   Lifestyle  Do not use any products that contain nicotine or tobacco, such as cigarettes and e-cigarettes. If you need help quitting, ask your health care provider.  Avoid secondhand smoke. Activity  Get plenty of rest.  Exercise as directed. Talk with your health care provider about what type of exercise is safe for you.  Avoid hot tubs and saunas.  Avoid high altitudes. General instructions  Take over-the-counter and prescription medicines only as told by your health care provider. Do not change or stop medicines without checking with your health care provider.  Stay up to date on your vaccines, especially yearly flu (influenza) and pneumonia vaccines.  If you are a woman of  child-bearing age, avoid becoming pregnant. Talk with your health care provider about birth control.  Consider ways to get support for anxiety and stress of living with pulmonary hypertension. Talk with your health care provider about support groups and online resources.  Use oxygen therapy at home as directed.  Keep track of your weight. Weight gain could be a sign that your condition is getting worse.  Keep all follow-up visits as told by your health care provider. This is important. Contact a health care provider if:  Your cough gets worse.  You have more shortness of breath than usual, or you start to have trouble doing activities that you could do before.  You need to use medicines or oxygen more frequently or in higher dosages than usual. Get help right away if:  You have severe shortness of breath.  You have chest pain or pressure.  You cough up blood.  You have swelling of your feet or legs that gets worse.  You have rapid weight gain over a period of 1-2 days.  Your medicines or oxygen do not provide relief. Summary  Pulmonary hypertension is a chronic condition in which there is high blood pressure in the arteries in the lungs (pulmonary arteries).  Pulmonary hypertension is a serious condition that can be life-threatening. It can be caused by a variety of illnesses.  Treatment may involve taking medicines  and using oxygen therapy. Severe cases may require surgery or a transplant. This information is not intended to replace advice given to you by your health care provider. Make sure you discuss any questions you have with your health care provider. Document Revised: 03/24/2020 Document Reviewed: 02/20/2017 Elsevier Patient Education  [place].

## 2021-04-04 NOTE — Progress Notes (Signed)
_0  ID: Kristie [name], female    DOB: [birth year], 70 y.o.   MRN: [id number]  Chief Complaint  Patient presents with  . Follow-up    Referring provider: Lowella Dandy, NP  HPI: 70 year old female, never smoked.  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  Patient of Dr. Silas Flood on 02/15/2021. Patient was recently hospitalized at [facility] for acute hypoxemic respiratory failure.    Previous [facility] pulmonary encounter:  02/15/21- Dr. Silas Flood  70 year old whom we are seeing for evaluation of pulmonary hypertension at the request of Dr. Vaughan Browner.  Due to most recent prior pulmonary notes reviewed.  Patient has ongoing dyspnea on exertion.  Worsening over time.  Now minimal activity such as walking on flat surfaces make things worse.  Worse with inclines or stairs.  No timing during the day were things are better or worse.  No seasonal or environmental changes that she can identify with things are better or worse.  No alleviating or exacerbating factors.  Review of records indicates 07/2019 CT high-res will review shows signs consistent with UIP on my evaluation.  Repeat high-res CT 01/2020 with stable findings my interpretation.  Most recent high-res CT 2/22 2 with mild progression of peripheral fibrosis without frank honeycombing on my interpretation.  TTE 01/2020 reviewed with normal RV function, RA size, mildly elevated PASP with mild mitral valve and aortic regurgitation.  Most recent TTE 12/4429 with diastolic dysfunction, mitral valve regurgitation, mild to moderate aortic valve regurgitation severely dilated RA, elevated right ultra pressure of 8, elevated PASP, enlarged RV with normal function all suggestive worsening pressure overload.  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 While being treated, she received both morphine and Ativan, which was quickly followed by acute respiratory failure and intubation 03/17/21. 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 Most recent PFT 08/03/2020 showed TLC 53% predicted with FEV1/FVC 95% with no significant bronchodilator response consistent with severe restrictive process. DLCO 47% consistent with parenchymal fibrosis. Additionally, has had significant progression of PAH leading to right heart failure. TTE 02/21 demonstrated normal RV size/function, RVSP 102mHg, and mild-moderate TR --> TTE 03/18/2021 showed new severe RA/RV enlargement, RVSP 49 mmHg, and severe TR. Likely combination of [place] 3 secondary to ILD and group 1 secondary to connective tissue disease (SLE). Tentative plan at the time was to initiate [name] treatment for pulmonary hypertension. However, this was never started. 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  Now complicated by congestive  hepatopathy. TTE 03/18/2021 also demonstrated dilated and noncollapsible IVC suggestive of volume overload. However, this is complicated by severe TR. Ultimately, PCWP would be helpful in assessing overall volume status, and patient may require invasive hemodynamic monitoring versus right heart cath. For now we will plan on diuresis overnight. Overall low suspicion for cardiogenic shock as she is warm and well-perfused with no pressor requirement. - 20 IV lasix; net -1L negative - holding home 21m lasix PO daily - Consider RHC for PCWP as CVP less reliable with severe TR/RHF - Daily weights - Strict I/O - BID chem with K>4.0, Mg>2.0   INFECTIOUS DISEASES # C/f septic shock  # ?duodenitis  Initially presented to outside hospital with severe chest and abdominal pain, hypothermia, hypotension, and severe lactic acidosis with lactate 11.9. CT of the abdomen and pelvis demonstrated potential duodenitis, but a clear source for her sepsis was never identified. She was treated with broad-spectrum antibiotics that were eventually narrowed to Levaquin, which we have continued. Concern for UTI at outside hospital, but UA was difficult to interpret in the context of significant number of squamous cells. Was at her respiratory baseline upon presentation to the hospital with 4 L nasal cannula, but pulmonary source cannot be excluded. Overall, remains unclear what exactly precipitated her initial presentation with hypotension/severe lactic acidosis.  - Fever Curve and Antibiotics (Current Encounter) - Levaquin --> cipro (4/6-P), but discuss stopping given unknown source; hx severe PCN allergy  - IV PPI, H pylori pending from OSH, resend here - outside imaging reads requested    [facility]  Patient presents today for hospital follow-up.   During last visit with Dr. HSilas Floodhe ordered split night sleep study and NM pulmonary perfusion study. In regards to pulmonary hypertension, ultimately  felt she may need right heart cath to directly measure pressues. CXR 02/21/21 showed diffuse interstitial infiltrated in both lungs, peripheral and basilar predominance, consistent with pulmonary fibrosis. Perfusion study showed no evidence of pulmonary embolism, generalized diminished and slightly patchy perfusion in left lower lobe consistent with pulmonary fibrosis seen on CXR. Patient cancelled overnight sleep study due to physical therapy.   She was in the hospital for 3 weeks in March 2022. [facility] started her on amlodipine 171m, Jardiance 1082maily , isosorbide 82m70mD, lisinopril 5mg 82mly, nitrostat, prednisone 5mg d65my, Ranexa 500MG BID. Changed lasix dose 20mg d8m. [facility] and Dr. PareleiMichail Jewelse 15th. She will be having a sleep study tonight. She is weighing herself every moring, weight has remained stable within 1 lb. She was 160lb today. Restricting fluid intake. She is taking lasix 20mg da75mas prescribed. She is on 2L oxygen at rest, 3L on exertoin. Nursing came Friday and physical therapy will be starting shortly.     Allergies  Allergen Reactions  . Penicillins Itching and Other (See Comments)    "Cillin" Family - Amoxicillin - itching    Did it involve swelling of the face/tongue/throat, SOB, or low BP? Yes Did it involve sudden or severe rash/hives, skin peeling, or any reaction on the inside of your mouth or nose? no Did you need to seek medical attention at a hospital or doctor's office? yes When did it last happen?30 years If all above answers are "NO", may proceed with cephalosporin use.  . DoxycyMarland Kitchenline Rash    Immunization History  Administered  Date(s) Administered  . Fluad Quad(high Dose 65+) 11/10/2020  . Influenza, High Dose Seasonal PF 08/29/2019  . Moderna Sars-Covid-2 Vaccination 01/01/2020, 01/26/2020, 11/03/2020    Past Medical  History:  Diagnosis Date  . Anxiety   . Bronchitis   . Fibromyalgia   . Hypertension    PMH: 2010  . Hypothyroidism   . Seasonal allergies   . Spinal stenosis    L-5 to S-1  . Thyroid disease   . Wears glasses     Tobacco History: Social History   Tobacco Use  Smoking Status Never Smoker  Smokeless Tobacco Never Used   Counseling given: Not Answered   Outpatient Medications Prior to Visit  Medication Sig Dispense Refill  . albuterol (PROVENTIL) (2.5 MG/3ML) 0.083% nebulizer solution Take 2.5 mg by nebulization every 4 (four) hours as needed for wheezing or shortness of breath.     Marland Kitchen amLODipine (NORVASC) 10 MG tablet Take 1 tablet by mouth daily.    Marland Kitchen amLODIPine-Valsartan-HCTZ 10-160-12.5 MG TABS     . Ascorbic Acid (VITAMIN C) 100 MG tablet Take 100 mg by mouth daily.    Marland Kitchen aspirin EC 81 MG tablet Take 81 mg by mouth daily.    Marland Kitchen atorvastatin (LIPITOR) 80 MG tablet Take 80 mg by mouth daily.     Marland Kitchen azaTHIOprine (IMURAN) 50 MG tablet Take 186m daily 270 tablet 3  . Calcium 250 MG CAPS Take 500 mg by mouth 2 (two) times daily.    . citalopram (CELEXA) 40 MG tablet Take 40 mg by mouth at bedtime.     . empagliflozin (JARDIANCE) 10 MG TABS tablet Take 1 tablet by mouth daily.    .Marland Kitchengabapentin (NEURONTIN) 100 MG capsule     . hydrOXYzine (ATARAX/VISTARIL) 25 MG tablet Take 25 mg by mouth at bedtime.     . isosorbide dinitrate (ISORDIL) 30 MG tablet Take by mouth.    .Marland Kitchenlisinopril (ZESTRIL) 5 MG tablet Take by mouth.    . loratadine-pseudoephedrine (CLARITIN-D 24-HOUR) 10-240 MG 24 hr tablet Take 1 tablet by mouth daily.    . Magnesium 250 MG TABS Take 1 tablet by mouth at bedtime.    . montelukast (SINGULAIR) 10 MG tablet Take 10 mg by mouth daily.     . Multiple Vitamin (MULTIVITAMIN WITH MINERALS) TABS tablet Take 1 tablet by mouth daily.    . nitroGLYCERIN (NITROSTAT) 0.4 MG SL tablet Place 0.4 mg under the tongue every 5 (five) minutes as needed for chest pain. AS NEEDED    .  ondansetron (ZOFRAN ODT) 4 MG disintegrating tablet Take 1 tablet (4 mg total) by mouth every 8 (eight) hours as needed for nausea or vomiting. 20 tablet 0  . pantoprazole (PROTONIX) 40 MG tablet TAKE 1 TABLET DAILY 30 TO 60 MINUTES BEFORE FIRST MEAL OF THE DAY 90 tablet 1  . pimecrolimus (ELIDEL) 1 % cream Apply 1 application topically 2 (two) times daily.    . predniSONE (DELTASONE) 5 MG tablet Take by mouth.    . ranolazine (RANEXA) 500 MG 12 hr tablet     . sulfamethoxazole-trimethoprim (BACTRIM DS) 800-160 MG tablet Take 1 tablet by mouth 3 (three) times a week. 12 tablet 5  . tiZANidine (ZANAFLEX) 4 MG capsule Take 1 capsule (4 mg total) by mouth 3 (three) times daily. (Patient taking differently: Take 4 mg by mouth 3 (three) times daily as needed.) 30 capsule 0  . famotidine (PEPCID) 20 MG tablet One after supper 30 tablet 5  .  levothyroxine (SYNTHROID, LEVOTHROID) 125 MCG tablet Take 125 mcg by mouth daily before breakfast.    . metoprolol succinate (TOPROL-XL) 25 MG 24 hr tablet Take 25 mg by mouth daily.     Marland Kitchen morphine (MSIR) 15 MG tablet Take 15 mg by mouth 3 (three) times daily as needed for moderate pain or severe pain.     Marland Kitchen nystatin (MYCOSTATIN) 100000 UNIT/ML suspension Take 5 mLs (500,000 Units total) by mouth 4 (four) times daily. 60 mL 0  . omeprazole (PRILOSEC OTC) 20 MG tablet Prilosec OTC 20 mg tablet,delayed release    . VITAMIN D, CHOLECALCIFEROL, PO Take 1 tablet by mouth daily.     No facility-administered medications prior to visit.   Review of Systems  Review of Systems  Constitutional: Negative.   HENT: Negative.   Respiratory: Positive for cough. Negative for chest tightness and wheezing.   Cardiovascular: Negative for chest pain and leg swelling.   Physical Exam  BP 126/68 (BP Location: Left Arm, Cuff Size: Normal)   Pulse (!) 115   Temp 98.6 F (37 C) (Oral)   Ht _0  (1.651 m)   Wt 164 lb (74.4 kg)   SpO2 99%   BMI 27.29 kg/m  Physical  Exam Constitutional:      Appearance: Normal appearance.  HENT:     Head: Normocephalic and atraumatic.     Mouth/Throat:     Comments: Deferred d/t masking Cardiovascular:     Rate and Rhythm: Normal rate and regular rhythm.     Comments: No edema  Pulmonary:     Effort: Pulmonary effort is normal.     Breath sounds: Rales present.  Musculoskeletal:     Comments: In WC, gait not assessed   Skin:    General: Skin is warm and dry.  Neurological:     General: No focal deficit present.     Mental Status: She is alert and oriented to person, place, and time. Mental status is at baseline.  Psychiatric:        Mood and Affect: Mood normal.        Behavior: Behavior normal.        Thought Content: Thought content normal.        Judgment: Judgment normal.      Lab Results:  CBC    Component Value Date/Time   WBC 13.0 (H) 01/05/2021 1212   RBC 3.59 (L) 01/05/2021 1212   HGB 10.9 (L) 01/05/2021 1212   HGB 13.2 03/26/2020 1425   HCT 33.5 (L) 01/05/2021 1212   PLT 453.0 (H) 01/05/2021 1212   PLT 431 (H) 03/26/2020 1425   MCV 93.6 01/05/2021 1212   MCH 29.1 03/26/2020 1425   MCHC 32.5 01/05/2021 1212   RDW 15.4 01/05/2021 1212   LYMPHSABS 1.9 01/05/2021 1212   MONOABS 1.1 (H) 01/05/2021 1212   EOSABS 0.7 01/05/2021 1212   BASOSABS 0.1 01/05/2021 1212    BMET    Component Value Date/Time   NA 136 03/26/2020 1425   K 3.8 03/26/2020 1425   CL 106 03/26/2020 1425   CO2 21 (L) 03/26/2020 1425   GLUCOSE 105 (H) 03/26/2020 1425   BUN 13 03/26/2020 1425   CREATININE 0.86 03/26/2020 1425   CALCIUM 8.9 03/26/2020 1425   GFRNONAA >60 03/26/2020 1425   GFRAA >60 03/26/2020 1425    BNP No results found for: BNP  ProBNP    Component Value Date/Time   PROBNP 139.0 (H) 05/29/2019 1118    Imaging: SLEEP  STUDY DOCUMENTS  Result Date: 04/21/2021 Ordered by an unspecified provider.    Assessment & Plan:   Snoring - Hx pulmonary HTN. She is scheduled for split night  sleep study. We will follow-up after sleep study to review results and treatment   ILD (interstitial lung disease) ([place]) - CXR 02/21/21 showed diffuse interstitial infiltrated in both lungs, peripheral and basilar predominance, consistent with pulmonary fibrosis - Following with Dr. Vaughan Browner    Pulmonary hypertension ([place]) - Ruling out sleep apnea as cause - Continue supplemental oxygen  - Patient had right heart cath with [facility] in April that showed moderate pulm HTN      Martyn Ehrich, NP 05/16/2021

## 2021-04-05 DIAGNOSIS — I2721 Secondary pulmonary arterial hypertension: Secondary | ICD-10-CM | POA: Diagnosis not present

## 2021-04-05 DIAGNOSIS — J849 Interstitial pulmonary disease, unspecified: Secondary | ICD-10-CM | POA: Diagnosis not present

## 2021-04-05 DIAGNOSIS — M549 Dorsalgia, unspecified: Secondary | ICD-10-CM | POA: Diagnosis not present

## 2021-04-05 DIAGNOSIS — I10 Essential (primary) hypertension: Secondary | ICD-10-CM | POA: Diagnosis not present

## 2021-04-05 DIAGNOSIS — Z8619 Personal history of other infectious and parasitic diseases: Secondary | ICD-10-CM | POA: Diagnosis not present

## 2021-04-05 DIAGNOSIS — Z79899 Other long term (current) drug therapy: Secondary | ICD-10-CM | POA: Diagnosis not present

## 2021-04-05 DIAGNOSIS — E039 Hypothyroidism, unspecified: Secondary | ICD-10-CM | POA: Diagnosis not present

## 2021-04-05 DIAGNOSIS — I2510 Atherosclerotic heart disease of native coronary artery without angina pectoris: Secondary | ICD-10-CM | POA: Diagnosis not present

## 2021-04-05 DIAGNOSIS — G8929 Other chronic pain: Secondary | ICD-10-CM | POA: Diagnosis not present

## 2021-04-06 DIAGNOSIS — M329 Systemic lupus erythematosus, unspecified: Secondary | ICD-10-CM | POA: Diagnosis not present

## 2021-04-06 DIAGNOSIS — J8410 Pulmonary fibrosis, unspecified: Secondary | ICD-10-CM | POA: Diagnosis not present

## 2021-04-06 DIAGNOSIS — I130 Hypertensive heart and chronic kidney disease with heart failure and stage 1 through stage 4 chronic kidney disease, or unspecified chronic kidney disease: Secondary | ICD-10-CM | POA: Diagnosis not present

## 2021-04-06 DIAGNOSIS — M3500 Sicca syndrome, unspecified: Secondary | ICD-10-CM | POA: Diagnosis not present

## 2021-04-06 DIAGNOSIS — D631 Anemia in chronic kidney disease: Secondary | ICD-10-CM | POA: Diagnosis not present

## 2021-04-06 DIAGNOSIS — I5033 Acute on chronic diastolic (congestive) heart failure: Secondary | ICD-10-CM | POA: Diagnosis not present

## 2021-04-06 DIAGNOSIS — N189 Chronic kidney disease, unspecified: Secondary | ICD-10-CM | POA: Diagnosis not present

## 2021-04-06 DIAGNOSIS — J9612 Chronic respiratory failure with hypercapnia: Secondary | ICD-10-CM | POA: Diagnosis not present

## 2021-04-06 DIAGNOSIS — I50810 Right heart failure, unspecified: Secondary | ICD-10-CM | POA: Diagnosis not present

## 2021-04-07 DIAGNOSIS — I5033 Acute on chronic diastolic (congestive) heart failure: Secondary | ICD-10-CM | POA: Diagnosis not present

## 2021-04-07 DIAGNOSIS — J9612 Chronic respiratory failure with hypercapnia: Secondary | ICD-10-CM | POA: Diagnosis not present

## 2021-04-07 DIAGNOSIS — N189 Chronic kidney disease, unspecified: Secondary | ICD-10-CM | POA: Diagnosis not present

## 2021-04-07 DIAGNOSIS — D631 Anemia in chronic kidney disease: Secondary | ICD-10-CM | POA: Diagnosis not present

## 2021-04-07 DIAGNOSIS — M329 Systemic lupus erythematosus, unspecified: Secondary | ICD-10-CM | POA: Diagnosis not present

## 2021-04-07 DIAGNOSIS — I50810 Right heart failure, unspecified: Secondary | ICD-10-CM | POA: Diagnosis not present

## 2021-04-07 DIAGNOSIS — J8410 Pulmonary fibrosis, unspecified: Secondary | ICD-10-CM | POA: Diagnosis not present

## 2021-04-07 DIAGNOSIS — M3500 Sicca syndrome, unspecified: Secondary | ICD-10-CM | POA: Diagnosis not present

## 2021-04-07 DIAGNOSIS — I130 Hypertensive heart and chronic kidney disease with heart failure and stage 1 through stage 4 chronic kidney disease, or unspecified chronic kidney disease: Secondary | ICD-10-CM | POA: Diagnosis not present

## 2021-04-11 DIAGNOSIS — I50810 Right heart failure, unspecified: Secondary | ICD-10-CM | POA: Diagnosis not present

## 2021-04-11 DIAGNOSIS — N189 Chronic kidney disease, unspecified: Secondary | ICD-10-CM | POA: Diagnosis not present

## 2021-04-11 DIAGNOSIS — J8410 Pulmonary fibrosis, unspecified: Secondary | ICD-10-CM | POA: Diagnosis not present

## 2021-04-11 DIAGNOSIS — D539 Nutritional anemia, unspecified: Secondary | ICD-10-CM | POA: Diagnosis not present

## 2021-04-11 DIAGNOSIS — J9612 Chronic respiratory failure with hypercapnia: Secondary | ICD-10-CM | POA: Diagnosis not present

## 2021-04-11 DIAGNOSIS — M329 Systemic lupus erythematosus, unspecified: Secondary | ICD-10-CM | POA: Diagnosis not present

## 2021-04-11 DIAGNOSIS — D631 Anemia in chronic kidney disease: Secondary | ICD-10-CM | POA: Diagnosis not present

## 2021-04-11 DIAGNOSIS — I130 Hypertensive heart and chronic kidney disease with heart failure and stage 1 through stage 4 chronic kidney disease, or unspecified chronic kidney disease: Secondary | ICD-10-CM | POA: Diagnosis not present

## 2021-04-11 DIAGNOSIS — Z8619 Personal history of other infectious and parasitic diseases: Secondary | ICD-10-CM | POA: Diagnosis not present

## 2021-04-11 DIAGNOSIS — M3500 Sicca syndrome, unspecified: Secondary | ICD-10-CM | POA: Diagnosis not present

## 2021-04-11 DIAGNOSIS — I5033 Acute on chronic diastolic (congestive) heart failure: Secondary | ICD-10-CM | POA: Diagnosis not present

## 2021-04-12 DIAGNOSIS — R079 Chest pain, unspecified: Secondary | ICD-10-CM | POA: Diagnosis not present

## 2021-04-12 DIAGNOSIS — I071 Rheumatic tricuspid insufficiency: Secondary | ICD-10-CM | POA: Diagnosis not present

## 2021-04-12 DIAGNOSIS — E785 Hyperlipidemia, unspecified: Secondary | ICD-10-CM | POA: Diagnosis not present

## 2021-04-12 DIAGNOSIS — I272 Pulmonary hypertension, unspecified: Secondary | ICD-10-CM | POA: Diagnosis not present

## 2021-04-15 DIAGNOSIS — J9612 Chronic respiratory failure with hypercapnia: Secondary | ICD-10-CM | POA: Diagnosis not present

## 2021-04-15 DIAGNOSIS — I50810 Right heart failure, unspecified: Secondary | ICD-10-CM | POA: Diagnosis not present

## 2021-04-15 DIAGNOSIS — I5033 Acute on chronic diastolic (congestive) heart failure: Secondary | ICD-10-CM | POA: Diagnosis not present

## 2021-04-15 DIAGNOSIS — D631 Anemia in chronic kidney disease: Secondary | ICD-10-CM | POA: Diagnosis not present

## 2021-04-15 DIAGNOSIS — N189 Chronic kidney disease, unspecified: Secondary | ICD-10-CM | POA: Diagnosis not present

## 2021-04-15 DIAGNOSIS — J8410 Pulmonary fibrosis, unspecified: Secondary | ICD-10-CM | POA: Diagnosis not present

## 2021-04-15 DIAGNOSIS — M329 Systemic lupus erythematosus, unspecified: Secondary | ICD-10-CM | POA: Diagnosis not present

## 2021-04-15 DIAGNOSIS — M3500 Sicca syndrome, unspecified: Secondary | ICD-10-CM | POA: Diagnosis not present

## 2021-04-15 DIAGNOSIS — I130 Hypertensive heart and chronic kidney disease with heart failure and stage 1 through stage 4 chronic kidney disease, or unspecified chronic kidney disease: Secondary | ICD-10-CM | POA: Diagnosis not present

## 2021-04-16 DIAGNOSIS — J9601 Acute respiratory failure with hypoxia: Secondary | ICD-10-CM | POA: Diagnosis not present

## 2021-04-16 DIAGNOSIS — J45909 Unspecified asthma, uncomplicated: Secondary | ICD-10-CM | POA: Diagnosis not present

## 2021-04-17 ENCOUNTER — Ambulatory Visit (HOSPITAL_BASED_OUTPATIENT_CLINIC_OR_DEPARTMENT_OTHER): Payer: Medicare PPO | Attending: Pulmonary Disease | Admitting: Pulmonary Disease

## 2021-04-17 ENCOUNTER — Other Ambulatory Visit: Payer: Self-pay

## 2021-04-17 DIAGNOSIS — I272 Pulmonary hypertension, unspecified: Secondary | ICD-10-CM | POA: Diagnosis not present

## 2021-04-17 DIAGNOSIS — R0902 Hypoxemia: Secondary | ICD-10-CM | POA: Diagnosis not present

## 2021-04-17 DIAGNOSIS — R0683 Snoring: Secondary | ICD-10-CM

## 2021-04-17 DIAGNOSIS — G4733 Obstructive sleep apnea (adult) (pediatric): Secondary | ICD-10-CM | POA: Insufficient documentation

## 2021-04-18 DIAGNOSIS — M3500 Sicca syndrome, unspecified: Secondary | ICD-10-CM | POA: Diagnosis not present

## 2021-04-18 DIAGNOSIS — N189 Chronic kidney disease, unspecified: Secondary | ICD-10-CM | POA: Diagnosis not present

## 2021-04-18 DIAGNOSIS — D631 Anemia in chronic kidney disease: Secondary | ICD-10-CM | POA: Diagnosis not present

## 2021-04-18 DIAGNOSIS — J9612 Chronic respiratory failure with hypercapnia: Secondary | ICD-10-CM | POA: Diagnosis not present

## 2021-04-18 DIAGNOSIS — I50810 Right heart failure, unspecified: Secondary | ICD-10-CM | POA: Diagnosis not present

## 2021-04-18 DIAGNOSIS — I130 Hypertensive heart and chronic kidney disease with heart failure and stage 1 through stage 4 chronic kidney disease, or unspecified chronic kidney disease: Secondary | ICD-10-CM | POA: Diagnosis not present

## 2021-04-18 DIAGNOSIS — M329 Systemic lupus erythematosus, unspecified: Secondary | ICD-10-CM | POA: Diagnosis not present

## 2021-04-18 DIAGNOSIS — I5033 Acute on chronic diastolic (congestive) heart failure: Secondary | ICD-10-CM | POA: Diagnosis not present

## 2021-04-18 DIAGNOSIS — J8410 Pulmonary fibrosis, unspecified: Secondary | ICD-10-CM | POA: Diagnosis not present

## 2021-04-19 NOTE — Procedures (Signed)
    Patient Name: Kristie [name], Kristie [name] Date: 04/17/2021 Gender: Female D.O.B: August 27, 1951 Age (years): 17 Referring Provider: Larey Days MD Height (inches): 24 Interpreting Physician: Chesley Mires MD, ABSM Weight (lbs): 164 RPSGT: Baxter Flattery BMI: 66 MRN: [id number] Neck Size: 15.00  CLINICAL INFORMATION Sleep Study Type: NPSG  Indication for sleep study: History of pulmonary hypertension with chronic respiratory failure, snoring.  Presents for assessment of sleep disordered breathing.  Epworth Sleepiness Score: 6  SLEEP STUDY TECHNIQUE As per the AASM Manual for the Scoring of Sleep and Associated Events v2.3 (April 2016) with a hypopnea requiring 4% desaturations.  The channels recorded and monitored were frontal, central and occipital EEG, electrooculogram (EOG), submentalis EMG (chin), nasal and oral airflow, thoracic and abdominal wall motion, anterior tibialis EMG, snore microphone, electrocardiogram, and pulse oximetry.  MEDICATIONS Medications self-administered by patient taken the night of the study : N/A  SLEEP ARCHITECTURE The study was initiated at 10:07:33 PM and ended at 5:07:25 AM.  Sleep onset time was 50.5 minutes and the sleep efficiency was 72.0%%. The total sleep time was 302.3 minutes.  Stage REM latency was 176.5 minutes.  The patient spent 4.0%% of the night in stage N1 sleep, 40.0%% in stage N2 sleep, 43.2%% in stage N3 and 12.9% in REM.  Alpha intrusion was absent.  Supine sleep was 100.00%.  RESPIRATORY PARAMETERS The overall apnea/hypopnea index (AHI) was 3.8 per hour. There were 19 total apneas, including 17 obstructive, 1 central and 1 mixed apneas. There were 0 hypopneas and 19 RERAs.  The AHI during Stage REM sleep was 1.5 per hour.  AHI while supine was 3.8 per hour.  The mean oxygen saturation was 98.8%. The minimum SpO2 during sleep was 92.0%.  The study was conducted with her using 2 liters supplemental oxygen.  loud  snoring was noted during this study.  CARDIAC DATA The 2 lead EKG demonstrated sinus rhythm. The mean heart rate was 84.5 beats per minute. Other EKG findings include: None.  LEG MOVEMENT DATA The total PLMS were 0 with a resulting PLMS index of 0.0. Associated arousal with leg movement index was 0.0 .  IMPRESSIONS - No significant obstructive sleep apnea with an AHI of 3.8 per hour. - She wore 2 liters supplemental oxygen during this study, and had good control of her oxygenation.  Her SpO2 low was 92%. - The patient snored with loud snoring volume.  DIAGNOSIS - Nocturnal Hypoxemia (G47.36)  RECOMMENDATIONS - Continue 2 liters supplemental oxygen. - Avoid alcohol, sedatives and other CNS depressants that may worsen sleep apnea and disrupt normal sleep architecture. - Sleep hygiene should be reviewed to assess factors that may improve sleep quality. - Weight management and regular exercise should be initiated or continued if appropriate.  [Electronically signed] 04/19/2021 03:29 PM  Chesley Mires MD, [place], American Board of Sleep Medicine   NPI: [id number]

## 2021-04-21 DIAGNOSIS — M3500 Sicca syndrome, unspecified: Secondary | ICD-10-CM | POA: Diagnosis not present

## 2021-04-21 DIAGNOSIS — I50810 Right heart failure, unspecified: Secondary | ICD-10-CM | POA: Diagnosis not present

## 2021-04-21 DIAGNOSIS — I5033 Acute on chronic diastolic (congestive) heart failure: Secondary | ICD-10-CM | POA: Diagnosis not present

## 2021-04-21 DIAGNOSIS — M329 Systemic lupus erythematosus, unspecified: Secondary | ICD-10-CM | POA: Diagnosis not present

## 2021-04-21 DIAGNOSIS — J8410 Pulmonary fibrosis, unspecified: Secondary | ICD-10-CM | POA: Diagnosis not present

## 2021-04-21 DIAGNOSIS — I130 Hypertensive heart and chronic kidney disease with heart failure and stage 1 through stage 4 chronic kidney disease, or unspecified chronic kidney disease: Secondary | ICD-10-CM | POA: Diagnosis not present

## 2021-04-21 DIAGNOSIS — D631 Anemia in chronic kidney disease: Secondary | ICD-10-CM | POA: Diagnosis not present

## 2021-04-21 DIAGNOSIS — J9612 Chronic respiratory failure with hypercapnia: Secondary | ICD-10-CM | POA: Diagnosis not present

## 2021-04-21 DIAGNOSIS — N189 Chronic kidney disease, unspecified: Secondary | ICD-10-CM | POA: Diagnosis not present

## 2021-04-25 DIAGNOSIS — J8410 Pulmonary fibrosis, unspecified: Secondary | ICD-10-CM | POA: Diagnosis not present

## 2021-04-25 DIAGNOSIS — I50810 Right heart failure, unspecified: Secondary | ICD-10-CM | POA: Diagnosis not present

## 2021-04-25 DIAGNOSIS — I5033 Acute on chronic diastolic (congestive) heart failure: Secondary | ICD-10-CM | POA: Diagnosis not present

## 2021-04-25 DIAGNOSIS — M329 Systemic lupus erythematosus, unspecified: Secondary | ICD-10-CM | POA: Diagnosis not present

## 2021-04-25 DIAGNOSIS — I130 Hypertensive heart and chronic kidney disease with heart failure and stage 1 through stage 4 chronic kidney disease, or unspecified chronic kidney disease: Secondary | ICD-10-CM | POA: Diagnosis not present

## 2021-04-25 DIAGNOSIS — M3500 Sicca syndrome, unspecified: Secondary | ICD-10-CM | POA: Diagnosis not present

## 2021-04-25 DIAGNOSIS — D631 Anemia in chronic kidney disease: Secondary | ICD-10-CM | POA: Diagnosis not present

## 2021-04-25 DIAGNOSIS — N189 Chronic kidney disease, unspecified: Secondary | ICD-10-CM | POA: Diagnosis not present

## 2021-04-25 DIAGNOSIS — J9612 Chronic respiratory failure with hypercapnia: Secondary | ICD-10-CM | POA: Diagnosis not present

## 2021-04-25 NOTE — Telephone Encounter (Signed)
MH please advise on sleep study results. Thanks!

## 2021-04-26 DIAGNOSIS — N189 Chronic kidney disease, unspecified: Secondary | ICD-10-CM | POA: Diagnosis not present

## 2021-04-26 DIAGNOSIS — M329 Systemic lupus erythematosus, unspecified: Secondary | ICD-10-CM | POA: Diagnosis not present

## 2021-04-26 DIAGNOSIS — M3500 Sicca syndrome, unspecified: Secondary | ICD-10-CM | POA: Diagnosis not present

## 2021-04-26 DIAGNOSIS — J9612 Chronic respiratory failure with hypercapnia: Secondary | ICD-10-CM | POA: Diagnosis not present

## 2021-04-26 DIAGNOSIS — I50810 Right heart failure, unspecified: Secondary | ICD-10-CM | POA: Diagnosis not present

## 2021-04-26 DIAGNOSIS — D631 Anemia in chronic kidney disease: Secondary | ICD-10-CM | POA: Diagnosis not present

## 2021-04-26 DIAGNOSIS — I130 Hypertensive heart and chronic kidney disease with heart failure and stage 1 through stage 4 chronic kidney disease, or unspecified chronic kidney disease: Secondary | ICD-10-CM | POA: Diagnosis not present

## 2021-04-26 DIAGNOSIS — J8410 Pulmonary fibrosis, unspecified: Secondary | ICD-10-CM | POA: Diagnosis not present

## 2021-04-26 DIAGNOSIS — I5033 Acute on chronic diastolic (congestive) heart failure: Secondary | ICD-10-CM | POA: Diagnosis not present

## 2021-04-28 DIAGNOSIS — J8410 Pulmonary fibrosis, unspecified: Secondary | ICD-10-CM | POA: Diagnosis not present

## 2021-04-28 DIAGNOSIS — J9612 Chronic respiratory failure with hypercapnia: Secondary | ICD-10-CM | POA: Diagnosis not present

## 2021-04-28 DIAGNOSIS — D631 Anemia in chronic kidney disease: Secondary | ICD-10-CM | POA: Diagnosis not present

## 2021-04-28 DIAGNOSIS — M329 Systemic lupus erythematosus, unspecified: Secondary | ICD-10-CM | POA: Diagnosis not present

## 2021-04-28 DIAGNOSIS — N189 Chronic kidney disease, unspecified: Secondary | ICD-10-CM | POA: Diagnosis not present

## 2021-04-28 DIAGNOSIS — I5033 Acute on chronic diastolic (congestive) heart failure: Secondary | ICD-10-CM | POA: Diagnosis not present

## 2021-04-28 DIAGNOSIS — M3500 Sicca syndrome, unspecified: Secondary | ICD-10-CM | POA: Diagnosis not present

## 2021-04-28 DIAGNOSIS — I130 Hypertensive heart and chronic kidney disease with heart failure and stage 1 through stage 4 chronic kidney disease, or unspecified chronic kidney disease: Secondary | ICD-10-CM | POA: Diagnosis not present

## 2021-04-28 DIAGNOSIS — I50810 Right heart failure, unspecified: Secondary | ICD-10-CM | POA: Diagnosis not present

## 2021-04-29 ENCOUNTER — Ambulatory Visit: Payer: Medicare PPO | Admitting: Primary Care

## 2021-04-30 DIAGNOSIS — J9601 Acute respiratory failure with hypoxia: Secondary | ICD-10-CM | POA: Diagnosis not present

## 2021-04-30 DIAGNOSIS — J45909 Unspecified asthma, uncomplicated: Secondary | ICD-10-CM | POA: Diagnosis not present

## 2021-05-03 ENCOUNTER — Ambulatory Visit: Payer: Medicare PPO | Admitting: Primary Care

## 2021-05-04 DIAGNOSIS — M3500 Sicca syndrome, unspecified: Secondary | ICD-10-CM | POA: Diagnosis not present

## 2021-05-04 DIAGNOSIS — D631 Anemia in chronic kidney disease: Secondary | ICD-10-CM | POA: Diagnosis not present

## 2021-05-04 DIAGNOSIS — I130 Hypertensive heart and chronic kidney disease with heart failure and stage 1 through stage 4 chronic kidney disease, or unspecified chronic kidney disease: Secondary | ICD-10-CM | POA: Diagnosis not present

## 2021-05-04 DIAGNOSIS — N189 Chronic kidney disease, unspecified: Secondary | ICD-10-CM | POA: Diagnosis not present

## 2021-05-04 DIAGNOSIS — J8410 Pulmonary fibrosis, unspecified: Secondary | ICD-10-CM | POA: Diagnosis not present

## 2021-05-04 DIAGNOSIS — I50810 Right heart failure, unspecified: Secondary | ICD-10-CM | POA: Diagnosis not present

## 2021-05-04 DIAGNOSIS — I5033 Acute on chronic diastolic (congestive) heart failure: Secondary | ICD-10-CM | POA: Diagnosis not present

## 2021-05-04 DIAGNOSIS — J9612 Chronic respiratory failure with hypercapnia: Secondary | ICD-10-CM | POA: Diagnosis not present

## 2021-05-04 DIAGNOSIS — M329 Systemic lupus erythematosus, unspecified: Secondary | ICD-10-CM | POA: Diagnosis not present

## 2021-05-05 DIAGNOSIS — M3500 Sicca syndrome, unspecified: Secondary | ICD-10-CM | POA: Diagnosis not present

## 2021-05-05 DIAGNOSIS — N189 Chronic kidney disease, unspecified: Secondary | ICD-10-CM | POA: Diagnosis not present

## 2021-05-05 DIAGNOSIS — I130 Hypertensive heart and chronic kidney disease with heart failure and stage 1 through stage 4 chronic kidney disease, or unspecified chronic kidney disease: Secondary | ICD-10-CM | POA: Diagnosis not present

## 2021-05-05 DIAGNOSIS — I50810 Right heart failure, unspecified: Secondary | ICD-10-CM | POA: Diagnosis not present

## 2021-05-05 DIAGNOSIS — I5033 Acute on chronic diastolic (congestive) heart failure: Secondary | ICD-10-CM | POA: Diagnosis not present

## 2021-05-05 DIAGNOSIS — M329 Systemic lupus erythematosus, unspecified: Secondary | ICD-10-CM | POA: Diagnosis not present

## 2021-05-05 DIAGNOSIS — J8410 Pulmonary fibrosis, unspecified: Secondary | ICD-10-CM | POA: Diagnosis not present

## 2021-05-05 DIAGNOSIS — D631 Anemia in chronic kidney disease: Secondary | ICD-10-CM | POA: Diagnosis not present

## 2021-05-05 DIAGNOSIS — J9612 Chronic respiratory failure with hypercapnia: Secondary | ICD-10-CM | POA: Diagnosis not present

## 2021-05-10 ENCOUNTER — Other Ambulatory Visit: Payer: Self-pay | Admitting: Primary Care

## 2021-05-11 NOTE — Telephone Encounter (Signed)
Yes

## 2021-05-11 NOTE — Telephone Encounter (Signed)
Beth, please advise if you are okay with [place] refilling med.

## 2021-05-12 DIAGNOSIS — I130 Hypertensive heart and chronic kidney disease with heart failure and stage 1 through stage 4 chronic kidney disease, or unspecified chronic kidney disease: Secondary | ICD-10-CM | POA: Diagnosis not present

## 2021-05-12 DIAGNOSIS — M3500 Sicca syndrome, unspecified: Secondary | ICD-10-CM | POA: Diagnosis not present

## 2021-05-12 DIAGNOSIS — I50810 Right heart failure, unspecified: Secondary | ICD-10-CM | POA: Diagnosis not present

## 2021-05-12 DIAGNOSIS — J9612 Chronic respiratory failure with hypercapnia: Secondary | ICD-10-CM | POA: Diagnosis not present

## 2021-05-12 DIAGNOSIS — I5033 Acute on chronic diastolic (congestive) heart failure: Secondary | ICD-10-CM | POA: Diagnosis not present

## 2021-05-12 DIAGNOSIS — D631 Anemia in chronic kidney disease: Secondary | ICD-10-CM | POA: Diagnosis not present

## 2021-05-12 DIAGNOSIS — J8410 Pulmonary fibrosis, unspecified: Secondary | ICD-10-CM | POA: Diagnosis not present

## 2021-05-12 DIAGNOSIS — N189 Chronic kidney disease, unspecified: Secondary | ICD-10-CM | POA: Diagnosis not present

## 2021-05-12 DIAGNOSIS — M329 Systemic lupus erythematosus, unspecified: Secondary | ICD-10-CM | POA: Diagnosis not present

## 2021-05-16 DIAGNOSIS — R0683 Snoring: Secondary | ICD-10-CM | POA: Insufficient documentation

## 2021-05-16 NOTE — Assessment & Plan Note (Addendum)
-  CXR 02/21/21 showed diffuse interstitial infiltrated in both lungs, peripheral and basilar predominance, consistent with pulmonary fibrosis - Following with Dr. Vaughan Browner

## 2021-05-16 NOTE — Assessment & Plan Note (Addendum)
-  Ruling out sleep apnea as cause - Continue supplemental oxygen  - Patient had right heart cath with [facility] in April that showed moderate pulm HTN

## 2021-05-16 NOTE — Assessment & Plan Note (Addendum)
-  Hx pulmonary HTN. She is scheduled for split night sleep study. We will follow-up after sleep study to review results and treatment

## 2021-05-17 DIAGNOSIS — I5033 Acute on chronic diastolic (congestive) heart failure: Secondary | ICD-10-CM | POA: Diagnosis not present

## 2021-05-17 DIAGNOSIS — N189 Chronic kidney disease, unspecified: Secondary | ICD-10-CM | POA: Diagnosis not present

## 2021-05-17 DIAGNOSIS — J8410 Pulmonary fibrosis, unspecified: Secondary | ICD-10-CM | POA: Diagnosis not present

## 2021-05-17 DIAGNOSIS — M329 Systemic lupus erythematosus, unspecified: Secondary | ICD-10-CM | POA: Diagnosis not present

## 2021-05-17 DIAGNOSIS — M3500 Sicca syndrome, unspecified: Secondary | ICD-10-CM | POA: Diagnosis not present

## 2021-05-17 DIAGNOSIS — I50810 Right heart failure, unspecified: Secondary | ICD-10-CM | POA: Diagnosis not present

## 2021-05-17 DIAGNOSIS — D631 Anemia in chronic kidney disease: Secondary | ICD-10-CM | POA: Diagnosis not present

## 2021-05-17 DIAGNOSIS — J9601 Acute respiratory failure with hypoxia: Secondary | ICD-10-CM | POA: Diagnosis not present

## 2021-05-17 DIAGNOSIS — J45909 Unspecified asthma, uncomplicated: Secondary | ICD-10-CM | POA: Diagnosis not present

## 2021-05-17 DIAGNOSIS — I130 Hypertensive heart and chronic kidney disease with heart failure and stage 1 through stage 4 chronic kidney disease, or unspecified chronic kidney disease: Secondary | ICD-10-CM | POA: Diagnosis not present

## 2021-05-17 DIAGNOSIS — J9612 Chronic respiratory failure with hypercapnia: Secondary | ICD-10-CM | POA: Diagnosis not present

## 2021-05-19 ENCOUNTER — Other Ambulatory Visit: Payer: Self-pay

## 2021-05-23 ENCOUNTER — Encounter: Payer: Self-pay | Admitting: Pulmonary Disease

## 2021-05-23 ENCOUNTER — Other Ambulatory Visit: Payer: Self-pay

## 2021-05-23 ENCOUNTER — Ambulatory Visit: Payer: Medicare PPO | Admitting: Pulmonary Disease

## 2021-05-23 VITALS — BP 116/74 | HR 102 | Temp 97.0°F | Ht 65.0 in | Wt 174.0 lb

## 2021-05-23 DIAGNOSIS — J849 Interstitial pulmonary disease, unspecified: Secondary | ICD-10-CM | POA: Diagnosis not present

## 2021-05-23 DIAGNOSIS — R0600 Dyspnea, unspecified: Secondary | ICD-10-CM | POA: Diagnosis not present

## 2021-05-23 DIAGNOSIS — J9611 Chronic respiratory failure with hypoxia: Secondary | ICD-10-CM

## 2021-05-23 DIAGNOSIS — I272 Pulmonary hypertension, unspecified: Secondary | ICD-10-CM | POA: Diagnosis not present

## 2021-05-23 DIAGNOSIS — R0609 Other forms of dyspnea: Secondary | ICD-10-CM

## 2021-05-23 NOTE — Patient Instructions (Signed)
Nice to see you!  We will work on the new medicine called [name].  Our pharmacy  team will be in touch.  Keep an eye on the weight and swelling in the legs. I think 172 pounds is the target to shoot for.   Follow up with Dr. Silas Flood in 6 weeks or so

## 2021-05-25 ENCOUNTER — Telehealth: Payer: Self-pay | Admitting: Pharmacy Technician

## 2021-05-25 DIAGNOSIS — I071 Rheumatic tricuspid insufficiency: Secondary | ICD-10-CM | POA: Diagnosis not present

## 2021-05-25 DIAGNOSIS — Z955 Presence of coronary angioplasty implant and graft: Secondary | ICD-10-CM | POA: Diagnosis not present

## 2021-05-25 DIAGNOSIS — I2510 Atherosclerotic heart disease of native coronary artery without angina pectoris: Secondary | ICD-10-CM | POA: Diagnosis not present

## 2021-05-25 DIAGNOSIS — E782 Mixed hyperlipidemia: Secondary | ICD-10-CM | POA: Diagnosis not present

## 2021-05-25 DIAGNOSIS — I272 Pulmonary hypertension, unspecified: Secondary | ICD-10-CM | POA: Diagnosis not present

## 2021-05-25 NOTE — Progress Notes (Signed)
_0  ID: Kristie [name], female    DOB: 11-Sep-1951, 70 y.o.   MRN: [id number]  Chief Complaint  Patient presents with   Follow-up    3 mo f/u for pulm htn. States she has been stable since last visit. Still using 2L of O2.     Referring provider: Lowella Dandy, NP  HPI:   70 year old whom we are seeing for evaluation of pulmonary hypertension. Multiple notes from hospitalization at [facility] 03/2021 reviewed. Most recent pulmonary note after discharge reviewed.  Patient returns for previously scheduled follow-up.  In the interim was hospitalized at [facility] with respiratory failure.  Per description she had increasing swelling.  Eventually she had hypertension that was thought to lead to flash pulmonary edema and was intubated.  She was diuresed aggressively.  She was subsequently extubated.  Right heart catheterization was performed and this was reviewed with patient today.  Revealed a mean PA pressure of 27, PVR 6, presumed Fick cardiac output of 3.8, cardiac index of 2.0 both mildly reduced with RA pressure of 1 and a wedge pressure of 4.  Document dry weight of 168 pounds was established on discharge.  It was thought that maybe initiation of beta-blocker caused decompensation of primarily right but also some left-sided heart failure symptoms due to reduced chronotropic/inotropic in the setting of her pulmonary hypertension.  Beta-blocker stopped.  Overall, she feels quite well.  She appears in better spirits when I first met her.  She feels as good as she has in some time.  We reviewed her right heart cath and with shared decision making decided that starting point vasodilators in her best interest especially in the setting of her borderline cardiac output and index.  Given her significant parenchymal disease in the lungs, we will start with [name].  Consider oral agents in the future.  HPI at initial visit: Patient has ongoing dyspnea on exertion.  Worsening over time.  Now minimal activity such as  walking on flat surfaces make things worse.  Worse with inclines or stairs.  No timing during the day were things are better or worse.  No seasonal or environmental changes that she can identify with things are better or worse.  No alleviating or exacerbating factors.  Review of records indicates 07/2019 CT high-res will review shows signs consistent with UIP on my evaluation.  Repeat high-res CT 01/2020 with stable findings my interpretation.  Most recent high-res CT 2/22 2 with mild progression of peripheral fibrosis without frank honeycombing on my interpretation.  TTE 01/2020 reviewed with normal RV function, RA size, mildly elevated PASP with mild mitral valve and aortic regurgitation.  Most recent TTE 04/9562 with diastolic dysfunction, mitral valve regurgitation, mild to moderate aortic valve regurgitation severely dilated RA, elevated right ultra pressure of 8, elevated PASP, enlarged RV with normal function all suggestive worsening pressure overload.  PMH: ILD, Sjogren's, connective tissue disease Surgical history: Spine surgery, gastric band and gastric sleeve resection Family history: Mother and father with CAD, sister with diabetes and CAD, brother with CAD Social history: She is a never smoker, lives with husband in [place] / Pulmonary Flowsheets:   ACT:  No flowsheet data found.  MMRC: mMRC Dyspnea Scale mMRC Score  11/22/2020 3  02/06/2020 2    Epworth:  No flowsheet data found.  Tests:   FENO:  No results found for: NITRICOXIDE  PFT: PFT Results Latest Ref Rng & Units 08/03/2020 03/09/2020  FVC-Pre L 1.59 1.75  FVC-Predicted Pre %  50 54  FVC-Post L 1.74 1.74  FVC-Predicted Post % 54 54  Pre FEV1/FVC % % 95 76  Post FEV1/FCV % % 93 87  FEV1-Pre L 1.52 1.32  FEV1-Predicted Pre % 62 53  FEV1-Post L 1.61 1.51  DLCO uncorrected ml/min/mmHg 9.52 8.44  DLCO UNC% % 47 41  DLCO corrected ml/min/mmHg 9.52 9.01  DLCO COR %Predicted % 47 44  DLVA Predicted  % 89 86  TLC L 2.75 2.75  TLC % Predicted % 53 52  RV % Predicted % 31 45  Personally reviewed and interpreted as spirometry suggestive of severe restriction, no significant bronchodilator response, TLC with moderate restriction 53% of predicted, DLCO severely reduced.  Combination of findings suggestive of parenchymal restrictive process.  WALK:  SIX MIN WALK 02/06/2020 09/18/2019 07/02/2019 05/29/2019  Medications albuterol neb sol 0.083%, atorvastatin 77m, famotidine 261m levothyroxine 12542m metoprolol 47m10montelukast 10mg80mrphine 15mg,96mran 4mg, p44moprazole 40mg at6m5am - - -  Supplimental Oxygen during Test? (L/min) No No - No  Laps 11 - - -  Partial Lap (in Meters) 0 - - -  Baseline BP (sitting) 130/78 - - -  Baseline Heartrate 84 - - -  Baseline Dyspnea (Borg Scale) 4 - - -  Baseline Fatigue (Borg Scale) 3 - - -  Baseline SPO2 99 - - -  BP (sitting) 166/88 - - -  Heartrate 116 - - -  Dyspnea (Borg Scale) 8 - - -  Fatigue (Borg Scale) 5 - - -  SPO2 94 - - -  BP (sitting) 160/82 - - -  Heartrate 86 - - -  SPO2 97 - - -  Stopped or Paused before Six Minutes No - - -  Interpretation Hip pain;Calf pain - - -  Distance Completed 374 - - -  Tech Comments: Pt walked at an average pace completing the entire 6 min without stopping. Pt did stagger some during the walk and also coughed throughout the walk. pt walked a very slow pace for approx 20 steps and stopped 3 x due to SOB and back pain//lmr average pace/SOB//lmr average pace/SOB//lmr    Imaging: Personally reviewed and as per EMR  Lab Results: Personally reviewed, notably eosinophils as high as 700 12/2020 CBC    Component Value Date/Time   WBC 13.0 (H) 01/05/2021 1212   RBC 3.59 (L) 01/05/2021 1212   HGB 10.9 (L) 01/05/2021 1212   HGB 13.2 03/26/2020 1425   HCT 33.5 (L) 01/05/2021 1212   PLT 453.0 (H) 01/05/2021 1212   PLT 431 (H) 03/26/2020 1425   MCV 93.6 01/05/2021 1212   MCH 29.1 03/26/2020 1425   MCHC  32.5 01/05/2021 1212   RDW 15.4 01/05/2021 1212   LYMPHSABS 1.9 01/05/2021 1212   MONOABS 1.1 (H) 01/05/2021 1212   EOSABS 0.7 01/05/2021 1212   BASOSABS 0.1 01/05/2021 1212    BMET    Component Value Date/Time   NA 136 03/26/2020 1425   K 3.8 03/26/2020 1425   CL 106 03/26/2020 1425   CO2 21 (L) 03/26/2020 1425   GLUCOSE 105 (H) 03/26/2020 1425   BUN 13 03/26/2020 1425   CREATININE 0.86 03/26/2020 1425   CALCIUM 8.9 03/26/2020 1425   GFRNONAA >60 03/26/2020 1425   GFRAA >60 03/26/2020 1425    BNP No results found for: BNP  ProBNP    Component Value Date/Time   PROBNP 139.0 (H) 05/29/2019 1118    Specialty Problems       Pulmonary Problems  Allergic rhinitis    Qualifier: Diagnosis of  By: Loanne Drilling MD, Hilliard Clark A        Chronic cough    Onset 2005 intermittent and evolved to chronic cough Jan 2020 on fosfamax -  D/c fosfamax and symb 160 05/29/2019 and max rx for reflux > resolved 07/02/2019        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 Jan 2020  - 05/29/2019   Walked RA  2 laps @  approx 264f each @ fast pace  stopped due to end of study c/o sob and sats down to 92%   - collagen vasc profile sent 05/29/2019 >  Pos for ANA with speckled pattern 1: 1280 and RA 19  - 07/02/2019   Walked RA  2 laps @  approx 2528feach @ avg pace  stopped due to  End of study with sats 91% at sob at end   - HRCT 07/24/2019 1. The appearance of the lungs remains compatible with interstitial lung disease, with a spectrum of findings considered probable usual interstitial pneumonia (UIP) per current ATS guidelines. 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 - Rheum eval 07/16/19 by ErMarella ChimesA with multiple studies sent        DOE (dyspnea on exertion)    Onset around 2015 assoc with PF   09/18/2019   Walked  RA x one lap =  approx 250 ft - stopped due to  Sob/back pain with sats 91% at end at very slow pace / freq stops       ILD (interstitial lung disease) ([facility]  Chronic respiratory failure with hypoxia (HCC)   Snoring    Allergies  Allergen Reactions   Penicillins Itching and Other (See Comments)    "Cillin" Family - Amoxicillin - itching    Did it involve swelling of the face/tongue/throat, SOB, or low BP? Yes Did it involve sudden or severe rash/hives, skin peeling, or any reaction on the inside of your mouth or nose? no Did you need to seek medical attention at a hospital or doctor's office? yes When did it last happen?      30 years If all above answers are "NO", may proceed with cephalosporin use.   Doxycycline Rash    Immunization History  Administered Date(s) Administered   Fluad Quad(high Dose 65+) 11/10/2020   Influenza, High Dose Seasonal PF 08/29/2019   Moderna Sars-Covid-2 Vaccination 01/01/2020, 01/26/2020, 11/03/2020    Past Medical History:  Diagnosis Date   Anxiety    Bronchitis    Fibromyalgia    Hypertension    PMH: 2010   Hypothyroidism    Seasonal allergies    Spinal stenosis    L-5 to S-1   Thyroid disease    Wears glasses     Tobacco History: Social History   Tobacco Use  Smoking Status Never  Smokeless Tobacco Never   Counseling given: Not Answered   Continue to not smoke  Outpatient Encounter Medications as of 05/23/2021  Medication Sig   albuterol (PROVENTIL) (2.5 MG/3ML) 0.083% nebulizer solution Take 2.5 mg by nebulization every 4 (four) hours as needed for wheezing or shortness of breath.    amLODipine (NORVASC) 10 MG tablet Take 1 tablet by mouth daily.   amLODIPine-Valsartan-HCTZ 10-160-12.5 MG TABS    Ascorbic Acid (VITAMIN C) 100 MG tablet Take 100 mg  by mouth daily.   aspirin EC 81 MG tablet Take 81 mg by mouth daily.   atorvastatin (LIPITOR) 80 MG tablet Take 80 mg by mouth daily.    azaTHIOprine (IMURAN) 50 MG tablet  Take 173m daily   benzonatate (TESSALON) 100 MG capsule TAKE 1 CAPSULE(100 MG) BY MOUTH THREE TIMES DAILY AS NEEDED FOR COUGH   Calcium 250 MG CAPS Take 500 mg by mouth 2 (two) times daily.   citalopram (CELEXA) 20 MG tablet Take 20 mg by mouth daily.   empagliflozin (JARDIANCE) 10 MG TABS tablet Take 1 tablet by mouth daily.   gabapentin (NEURONTIN) 100 MG capsule    hydrOXYzine (ATARAX/VISTARIL) 25 MG tablet Take 25 mg by mouth at bedtime.    isosorbide dinitrate (ISORDIL) 30 MG tablet Take by mouth.   levothyroxine (SYNTHROID) 100 MCG tablet Take 100 mcg by mouth daily before breakfast.   lisinopril (ZESTRIL) 5 MG tablet Take by mouth.   loratadine-pseudoephedrine (CLARITIN-D 24-HOUR) 10-240 MG 24 hr tablet Take 1 tablet by mouth daily.   Magnesium 250 MG TABS Take 1 tablet by mouth at bedtime.   montelukast (SINGULAIR) 10 MG tablet Take 10 mg by mouth daily.    Multiple Vitamin (MULTIVITAMIN WITH MINERALS) TABS tablet Take 1 tablet by mouth daily.   nitroGLYCERIN (NITROSTAT) 0.4 MG SL tablet Place 0.4 mg under the tongue every 5 (five) minutes as needed for chest pain. AS NEEDED   ondansetron (ZOFRAN ODT) 4 MG disintegrating tablet Take 1 tablet (4 mg total) by mouth every 8 (eight) hours as needed for nausea or vomiting.   pantoprazole (PROTONIX) 40 MG tablet TAKE 1 TABLET DAILY 30 TO 60 MINUTES BEFORE FIRST MEAL OF THE DAY   pimecrolimus (ELIDEL) 1 % cream Apply 1 application topically 2 (two) times daily.   predniSONE (DELTASONE) 5 MG tablet Take by mouth.   ranolazine (RANEXA) 500 MG 12 hr tablet    sulfamethoxazole-trimethoprim (BACTRIM DS) 800-160 MG tablet Take 1 tablet by mouth 3 (three) times a week.   tiZANidine (ZANAFLEX) 4 MG capsule Take 1 capsule (4 mg total) by mouth 3 (three) times daily. (Patient taking differently: Take 4 mg by mouth 3 (three) times daily as needed.)   [DISCONTINUED] citalopram (CELEXA) 40 MG tablet Take 40 mg by mouth at bedtime.    [DISCONTINUED]  famotidine (PEPCID) 20 MG tablet One after supper   [DISCONTINUED] levothyroxine (SYNTHROID, LEVOTHROID) 125 MCG tablet Take 125 mcg by mouth daily before breakfast.   [DISCONTINUED] metoprolol succinate (TOPROL-XL) 25 MG 24 hr tablet Take 25 mg by mouth daily.    [DISCONTINUED] morphine (MSIR) 15 MG tablet Take 15 mg by mouth 3 (three) times daily as needed for moderate pain or severe pain.    [DISCONTINUED] nystatin (MYCOSTATIN) 100000 UNIT/ML suspension Take 5 mLs (500,000 Units total) by mouth 4 (four) times daily.   [DISCONTINUED] omeprazole (PRILOSEC OTC) 20 MG tablet Prilosec OTC 20 mg tablet,delayed release   [DISCONTINUED] VITAMIN D, CHOLECALCIFEROL, PO Take 1 tablet by mouth daily.   No facility-administered encounter medications on file as of 05/23/2021.     Review of Systems  Review of Systems  N/A  Physical Exam  BP 116/74   Pulse (!) 102   Temp (!) 97 F (36.1 C) (Temporal)   Ht _0  (1.651 m)   Wt 174 lb (78.9 kg)   SpO2 96% Comment: on 2L  BMI 28.96 kg/m   Wt Readings from Last 5 Encounters:  05/23/21 174 lb (78.9 kg)  04/17/21 164 lb (74.4 kg)  04/04/21 164 lb (74.4 kg)  02/15/21 171 lb 9.6 oz (77.8 kg)  01/05/21 174 lb 12.8 oz (79.3 kg)    BMI Readings from Last 5 Encounters:  05/23/21 28.96 kg/m  04/17/21 27.29 kg/m  04/04/21 27.29 kg/m  02/15/21 28.56 kg/m  01/05/21 29.09 kg/m     Physical Exam General: Well-appearing, no acute distress Eyes: EOMI, no icterus Neck: Supple, no JVP Pulmonary: Very fine crackles in bilateral bases to midlung fields, otherwise clear, normal work of breathing on oxygen Cardiovascular: Tachycardic, regular rhythm, no murmurs Abdomen: Nondistended, bowel sounds present MSK: No synovitis, joint effusion Neuro: No weakness, sensation intact Psych: Normal mood, full affect   Assessment & Plan:   Dyspnea on exertion: Likely multifactorial with accommodation of interstitial lung disease and restrictive  physiology as well likely pulmonary hypertension based on cardiogram.  Aggressively being treated for ILD per Dr. Vaughan Browner with recent increase in Imuran.  Recent admission for decompensated heart failure (largely right-sided) with significant improvement and better symptoms with aggressive diuresis.  Pulmonary hypertension: Suspect contributions largely from group 3 disease given hypoxemia, interstitial lung disease.  No OSA on polysomnography and no evidence of chronic PE on VQ scan.  Given significant group 3 disease and parenchymal disease, will institute [name] in effort to minimize VQ mismatch.  If tolerates well will consider oral medication.  Dry weight approximately 172 pounds.  Difficult to assess given she is starting to gain weight after significant diarrhea and malnutrition.  Close eye on her swelling and husband does a good job monitoring this.   Return in about 6 weeks (around 07/04/2021).   Lanier Clam, MD 05/25/2021   This appointment required 45 minutes of patient care (this includes precharting, chart review, review of results, face-to-face care, etc.).

## 2021-05-25 NOTE — Telephone Encounter (Signed)
Received New start paperwork for [name]. Will update as we work through the benefits process. 

## 2021-05-26 DIAGNOSIS — J9612 Chronic respiratory failure with hypercapnia: Secondary | ICD-10-CM | POA: Diagnosis not present

## 2021-05-26 DIAGNOSIS — J8410 Pulmonary fibrosis, unspecified: Secondary | ICD-10-CM | POA: Diagnosis not present

## 2021-05-26 DIAGNOSIS — I130 Hypertensive heart and chronic kidney disease with heart failure and stage 1 through stage 4 chronic kidney disease, or unspecified chronic kidney disease: Secondary | ICD-10-CM | POA: Diagnosis not present

## 2021-05-26 DIAGNOSIS — I50810 Right heart failure, unspecified: Secondary | ICD-10-CM | POA: Diagnosis not present

## 2021-05-26 DIAGNOSIS — M3500 Sicca syndrome, unspecified: Secondary | ICD-10-CM | POA: Diagnosis not present

## 2021-05-26 DIAGNOSIS — D631 Anemia in chronic kidney disease: Secondary | ICD-10-CM | POA: Diagnosis not present

## 2021-05-26 DIAGNOSIS — M329 Systemic lupus erythematosus, unspecified: Secondary | ICD-10-CM | POA: Diagnosis not present

## 2021-05-26 DIAGNOSIS — N189 Chronic kidney disease, unspecified: Secondary | ICD-10-CM | POA: Diagnosis not present

## 2021-05-26 DIAGNOSIS — I5033 Acute on chronic diastolic (congestive) heart failure: Secondary | ICD-10-CM | POA: Diagnosis not present

## 2021-05-27 NOTE — Telephone Encounter (Signed)
Submitted referral paperwork to [facility] for [name] along with HRCT, right heart cath results, and most recent progress note.  Fax# [phone] Phone# [phone]  [employer] Assist form signed by patient is on file with application to be submitted once we receive update from [facility].  I've also notified Donna, our [facility] rep, to advise that referral is incoming  Neliah Cuyler, PharmD, MPH Clinical Pharmacist (Rheumatology and Pulmonology)  

## 2021-05-30 DIAGNOSIS — I2510 Atherosclerotic heart disease of native coronary artery without angina pectoris: Secondary | ICD-10-CM | POA: Diagnosis not present

## 2021-05-30 DIAGNOSIS — I10 Essential (primary) hypertension: Secondary | ICD-10-CM | POA: Diagnosis not present

## 2021-05-30 DIAGNOSIS — D539 Nutritional anemia, unspecified: Secondary | ICD-10-CM | POA: Diagnosis not present

## 2021-05-30 DIAGNOSIS — J849 Interstitial pulmonary disease, unspecified: Secondary | ICD-10-CM | POA: Diagnosis not present

## 2021-05-30 DIAGNOSIS — G8929 Other chronic pain: Secondary | ICD-10-CM | POA: Diagnosis not present

## 2021-05-30 DIAGNOSIS — M549 Dorsalgia, unspecified: Secondary | ICD-10-CM | POA: Diagnosis not present

## 2021-05-30 DIAGNOSIS — I50810 Right heart failure, unspecified: Secondary | ICD-10-CM | POA: Diagnosis not present

## 2021-05-30 DIAGNOSIS — J9611 Chronic respiratory failure with hypoxia: Secondary | ICD-10-CM | POA: Diagnosis not present

## 2021-05-30 DIAGNOSIS — E785 Hyperlipidemia, unspecified: Secondary | ICD-10-CM | POA: Diagnosis not present

## 2021-05-30 DIAGNOSIS — E039 Hypothyroidism, unspecified: Secondary | ICD-10-CM | POA: Diagnosis not present

## 2021-05-30 DIAGNOSIS — I2721 Secondary pulmonary arterial hypertension: Secondary | ICD-10-CM | POA: Diagnosis not present

## 2021-05-31 DIAGNOSIS — J45909 Unspecified asthma, uncomplicated: Secondary | ICD-10-CM | POA: Diagnosis not present

## 2021-05-31 DIAGNOSIS — J9601 Acute respiratory failure with hypoxia: Secondary | ICD-10-CM | POA: Diagnosis not present

## 2021-06-03 NOTE — Telephone Encounter (Signed)
Received fax from [facility] for patient's [name] - medical precertification form. Submitted ack to Cruzita Lederer with [facility].  Fax: [phone]  Knox Saliva, PharmD, MPH Clinical Pharmacist (Rheumatology and Pulmonology)

## 2021-06-08 NOTE — Telephone Encounter (Signed)
Received notification from [facility] regarding a prior authorization for [name]. Authorization has been APPROVED from 12/11/20 to 12/10/21 through Medicare Part B.  Denied through [employer] Part D  Authorization # [id number] Phone # [phone]  Knox Saliva, PharmD, MPH Clinical Pharmacist (Rheumatology and Pulmonology)

## 2021-06-16 DIAGNOSIS — J9612 Chronic respiratory failure with hypercapnia: Secondary | ICD-10-CM | POA: Diagnosis not present

## 2021-06-16 DIAGNOSIS — J45909 Unspecified asthma, uncomplicated: Secondary | ICD-10-CM | POA: Diagnosis not present

## 2021-06-16 DIAGNOSIS — J9601 Acute respiratory failure with hypoxia: Secondary | ICD-10-CM | POA: Diagnosis not present

## 2021-06-16 DIAGNOSIS — I130 Hypertensive heart and chronic kidney disease with heart failure and stage 1 through stage 4 chronic kidney disease, or unspecified chronic kidney disease: Secondary | ICD-10-CM | POA: Diagnosis not present

## 2021-06-16 DIAGNOSIS — N189 Chronic kidney disease, unspecified: Secondary | ICD-10-CM | POA: Diagnosis not present

## 2021-06-16 DIAGNOSIS — M329 Systemic lupus erythematosus, unspecified: Secondary | ICD-10-CM | POA: Diagnosis not present

## 2021-06-16 DIAGNOSIS — M3500 Sicca syndrome, unspecified: Secondary | ICD-10-CM | POA: Diagnosis not present

## 2021-06-16 DIAGNOSIS — I50810 Right heart failure, unspecified: Secondary | ICD-10-CM | POA: Diagnosis not present

## 2021-06-16 DIAGNOSIS — D631 Anemia in chronic kidney disease: Secondary | ICD-10-CM | POA: Diagnosis not present

## 2021-06-16 DIAGNOSIS — J8410 Pulmonary fibrosis, unspecified: Secondary | ICD-10-CM | POA: Diagnosis not present

## 2021-06-16 DIAGNOSIS — I5033 Acute on chronic diastolic (congestive) heart failure: Secondary | ICD-10-CM | POA: Diagnosis not present

## 2021-06-17 DIAGNOSIS — I2723 Pulmonary hypertension due to lung diseases and hypoxia: Secondary | ICD-10-CM | POA: Diagnosis not present

## 2021-06-17 NOTE — Telephone Encounter (Signed)
Per [facility] rep, patient's has consented and agreed to shipment of [name]. Pharmacy is still in process of setting up shipment and then nursing visit will be scheduled thereafter. Will continue to f/u  Knox Saliva, PharmD, MPH Clinical Pharmacist (Rheumatology and Pulmonology)

## 2021-06-29 NOTE — Telephone Encounter (Signed)
Pt is scheduled with Dr. Silas Flood at 3pm on 08/03/21. Nothing further needed at this time.

## 2021-06-29 NOTE — Telephone Encounter (Signed)
Patient had first nursing visit for [name] on 06/21/21. Next nursing visit scheduled for today. Called patient and she states it is going well - no side effects.   No f/u with Dr. Silas Flood yet scheduled. Routing to scheduling team as patient will need f/u 6-8 weeks after starting [name] (end of August, early September 2022. After discussion with Dr. Silas Flood, she does not need PFT before this visit. Patient is aware that scheduling team will reach out to have f/u appointment made.  Knox Saliva, PharmD, MPH, BCPS Clinical Pharmacist (Rheumatology and Pulmonology)

## 2021-06-30 ENCOUNTER — Other Ambulatory Visit: Payer: Self-pay | Admitting: Pulmonary Disease

## 2021-06-30 DIAGNOSIS — J45909 Unspecified asthma, uncomplicated: Secondary | ICD-10-CM | POA: Diagnosis not present

## 2021-06-30 DIAGNOSIS — J9601 Acute respiratory failure with hypoxia: Secondary | ICD-10-CM | POA: Diagnosis not present

## 2021-06-30 DIAGNOSIS — J849 Interstitial pulmonary disease, unspecified: Secondary | ICD-10-CM

## 2021-07-01 DIAGNOSIS — I130 Hypertensive heart and chronic kidney disease with heart failure and stage 1 through stage 4 chronic kidney disease, or unspecified chronic kidney disease: Secondary | ICD-10-CM | POA: Diagnosis not present

## 2021-07-01 DIAGNOSIS — D631 Anemia in chronic kidney disease: Secondary | ICD-10-CM | POA: Diagnosis not present

## 2021-07-01 DIAGNOSIS — I50810 Right heart failure, unspecified: Secondary | ICD-10-CM | POA: Diagnosis not present

## 2021-07-01 DIAGNOSIS — J9612 Chronic respiratory failure with hypercapnia: Secondary | ICD-10-CM | POA: Diagnosis not present

## 2021-07-01 DIAGNOSIS — M329 Systemic lupus erythematosus, unspecified: Secondary | ICD-10-CM | POA: Diagnosis not present

## 2021-07-01 DIAGNOSIS — J8410 Pulmonary fibrosis, unspecified: Secondary | ICD-10-CM | POA: Diagnosis not present

## 2021-07-01 DIAGNOSIS — N189 Chronic kidney disease, unspecified: Secondary | ICD-10-CM | POA: Diagnosis not present

## 2021-07-01 DIAGNOSIS — I5033 Acute on chronic diastolic (congestive) heart failure: Secondary | ICD-10-CM | POA: Diagnosis not present

## 2021-07-01 DIAGNOSIS — M3500 Sicca syndrome, unspecified: Secondary | ICD-10-CM | POA: Diagnosis not present

## 2021-07-05 DIAGNOSIS — I2723 Pulmonary hypertension due to lung diseases and hypoxia: Secondary | ICD-10-CM | POA: Diagnosis not present

## 2021-07-05 DIAGNOSIS — J8410 Pulmonary fibrosis, unspecified: Secondary | ICD-10-CM | POA: Diagnosis not present

## 2021-07-14 ENCOUNTER — Telehealth: Payer: Self-pay | Admitting: Pulmonary Disease

## 2021-07-14 MED ORDER — ALBUTEROL SULFATE HFA 108 (90 BASE) MCG/ACT IN AERS: 2.0000 | INHALATION_SPRAY | RESPIRATORY_TRACT | 6 refills | Status: DC | PRN

## 2021-07-14 MED ORDER — ALBUTEROL SULFATE (2.5 MG/3ML) 0.083% IN NEBU: 2.5000 mg | INHALATION_SOLUTION | RESPIRATORY_TRACT | 5 refills | Status: DC | PRN

## 2021-07-14 NOTE — Telephone Encounter (Signed)
She should have a PCR test instead of a home test. We can send in RX refill for albuterol rescue inhaler 2 puffs every 4-6 hours as needed for shortness of breath/wheezing/cough (there are no listed interactions with tyvaso). She should follow-up with her PCP who prescribed paxlovid as there can be potential medication reactions with her current medications. Did they tell her to hold her cholesterol medication?

## 2021-07-14 NOTE — Telephone Encounter (Signed)
Called and spoke with Patient and her husband Abe People. Abe People stated he was tested positive Monday for Covid and started on Paxlovid by PCP.  Abe People stated Patient started feeling bad 2 days ago, but home covid test was negative.  Patient stated she has taken 3 home covid test and all have been negative.  Abe People stated PCP spoke with them last evening and started Patient on Paxlovid, because she felt it was a false negative home test.  Patient is having productive cough, white sputum, body aches, and chills.  Patient has been taking Tylenol and second Paxlovid dose this morning.  Abe People is concerned, because he feels Patient needs inhaler, but PCP would not prescribe Patient an inhaler, because Patient is taking [name] for PH.  Patient has albuterol nebs on current med list, but Patient is currently out of  albuterol neb medication, and is afraid it may interact with Tyvasco. Billy requested any recommendations or inhaler to help Patient. Patient seen by Dr. Silas Flood and Dr. Vaughan Browner  Message routed to Dr. Boykin Reaper. Silas Flood is unavailable at this time

## 2021-07-14 NOTE — Telephone Encounter (Signed)
Called and spoke with Kristie [name] [facility]) and Kristie [name]. Kristie Maize, NP recommendations given.  Understanding stated.  Kristie [name] stated Kristie [name] took another home covid test and it was positive.  Kristie [name] is holding cholesterol medication while taking Paxlovid. Albuterol inhaler prescription sent to requested [facility].  Kristie [name] requested albuterol neb refill also to be sent to [facility]. Neb medication sent to prescription sent to requested pharmacy.  Nothing further at this time.

## 2021-07-17 DIAGNOSIS — J45909 Unspecified asthma, uncomplicated: Secondary | ICD-10-CM | POA: Diagnosis not present

## 2021-07-17 DIAGNOSIS — J9601 Acute respiratory failure with hypoxia: Secondary | ICD-10-CM | POA: Diagnosis not present

## 2021-07-25 DIAGNOSIS — H9202 Otalgia, left ear: Secondary | ICD-10-CM | POA: Diagnosis not present

## 2021-07-25 DIAGNOSIS — Z9981 Dependence on supplemental oxygen: Secondary | ICD-10-CM | POA: Diagnosis not present

## 2021-07-25 DIAGNOSIS — I50810 Right heart failure, unspecified: Secondary | ICD-10-CM | POA: Diagnosis not present

## 2021-07-25 DIAGNOSIS — J209 Acute bronchitis, unspecified: Secondary | ICD-10-CM | POA: Diagnosis not present

## 2021-07-25 DIAGNOSIS — J849 Interstitial pulmonary disease, unspecified: Secondary | ICD-10-CM | POA: Diagnosis not present

## 2021-07-25 DIAGNOSIS — J9611 Chronic respiratory failure with hypoxia: Secondary | ICD-10-CM | POA: Diagnosis not present

## 2021-07-26 ENCOUNTER — Telehealth: Payer: Self-pay | Admitting: Pulmonary Disease

## 2021-07-26 DIAGNOSIS — U071 COVID-19: Secondary | ICD-10-CM

## 2021-07-26 DIAGNOSIS — J8410 Pulmonary fibrosis, unspecified: Secondary | ICD-10-CM

## 2021-07-26 MED ORDER — BUDESONIDE-FORMOTEROL FUMARATE 160-4.5 MCG/ACT IN AERO: 2.0000 | INHALATION_SPRAY | Freq: Two times a day (BID) | RESPIRATORY_TRACT | 2 refills | Status: DC

## 2021-07-26 MED ORDER — PREDNISONE 20 MG PO TABS: 40.0000 mg | ORAL_TABLET | Freq: Every day | ORAL | 0 refills | Status: AC

## 2021-07-26 NOTE — Telephone Encounter (Signed)
I am so sorry she is not feeling well. Continue 4L with exertion. Would like to get a CXR to see if COVID is still affecting the lungs. How much prednisone is she taking? If this is and the albuterol help she may benefit from a maintenance ICS/LABA inhaler.   What dose of [name] is she on? Any recent changes that may correlate with worsening or just the COVID?

## 2021-07-26 NOTE — Telephone Encounter (Signed)
Pt stated that it has been two weeks since she has had Covid-19 and she stated that her O2 levels have still been dropping and stated that it is not getting as low as the 70's and was informed by her PCP to notify Dr. Silas Flood.   Pharmacy; [id number], [address] AT [address]  [address], [place] [address]   [place]; [phone]

## 2021-07-26 NOTE — Telephone Encounter (Signed)
I called and went over Dr. Silas Flood questions and recs. Patient is taking pred taper,started at 57m for 2 days, 380mfor 2 days, 2021mor days, 73m23mr 2 days then stop. She is taking [name], 8 puffs, 4 times daily. She stated the prednisone and albuterol is helping and would like to discuss maintenance inhaler at next visit on 08/03/21. She would also like to get the CXR done at this time as well. She stated she thinks the decline is from covid. She stated she was feeling better before, had more energy and could do more for herself before but now, she is much more limited with her breathing and symptoms. Will route to Dr. hunsSilas Flood

## 2021-07-26 NOTE — Telephone Encounter (Signed)
I called and went over Dr. Silas Flood recs and meds that he has sent in. Patient verbalized understanding and will come by in the morning to get the CXR done. I have placed that order as well. Nothing further needed

## 2021-07-26 NOTE — Telephone Encounter (Signed)
Called and spoke with Patient.  Patient stated she is no longer taking prednisone.  Patient stated she always felt like she was breathing better with Prednisone. Patient stated she is using her albuterol inhaler and albuterol nebs.  Patient stated she feels some improvement afterwards, but it doesn't last as long.   Patient is currently taking [name] 8 puffs, four times a day. Patient stated she is having a productive cough at night, white, thick sputum, and she is using cough drops to suppress it. Patient is scheduled 08/03/21 at 3pm.  Message routed to Dr. Silas Flood

## 2021-07-26 NOTE — Telephone Encounter (Signed)
Primary Pulmonologist: Dr. Silas Flood Last office visit and with whom: 05/23/21 with Dr. Silas Flood What do we see them for (pulmonary problems): Chronic respiratory failure, ILD, DOE and Pulmonary HTN Last OV assessment/plan: see below  Was appointment offered to patient (explain)?    Dyspnea on exertion: Likely multifactorial with accommodation of interstitial lung disease and restrictive physiology as well likely pulmonary hypertension based on cardiogram.  Aggressively being treated for ILD per Dr. Vaughan Browner with recent increase in Imuran.  Recent admission for decompensated heart failure (largely right-sided) with significant improvement and better symptoms with aggressive diuresis.   Pulmonary hypertension: Suspect contributions largely from group 3 disease given hypoxemia, interstitial lung disease.  No OSA on polysomnography and no evidence of chronic PE on VQ scan.  Given significant group 3 disease and parenchymal disease, will institute [name] in effort to minimize VQ mismatch.  If tolerates well will consider oral medication.  Dry weight approximately 172 pounds.  Difficult to assess given she is starting to gain weight after significant diarrhea and malnutrition.  Close eye on her swelling and husband does a good job monitoring this.     Return in about 6 weeks (around 07/04/2021).     Lanier Clam, MD 05/25/2021       Reason for call: patient called stating that she had covid 2 weeks ago and is still having issues. Main complaint is O2 levels are staying in the 70s with exertion on 2L of O2, will come up to 92% with 4L of O2. When sitting, O2 levels are 94% with 2L. Patient is still coughing often with occ clear mucous production and wheezing more at night. No other symptoms. PCP gave pred taper yesterday and uses albuterol inhaler every 4 hours which helps with coughing. PCP also said eardrum looks red and swollen. PCP wanted patient to update provider. Will route to Dr.  Silas Flood for any additional recs.  Dr. Silas Flood, please advise. Thanks!  (examples of things to ask: : When did symptoms start? Fever? Cough? Productive? Color to sputum? More sputum than usual? Wheezing? Have you needed increased oxygen? Are you taking your respiratory medications? What over the counter measures have you tried?)  Allergies  Allergen Reactions   Penicillins Itching and Other (See Comments)    "Cillin" Family - Amoxicillin - itching    Did it involve swelling of the face/tongue/throat, SOB, or low BP? Yes Did it involve sudden or severe rash/hives, skin peeling, or any reaction on the inside of your mouth or nose? no Did you need to seek medical attention at a hospital or doctor's office? yes When did it last happen?      30 years If all above answers are "NO", may proceed with cephalosporin use.   Doxycycline Rash    Immunization History  Administered Date(s) Administered   Fluad Quad(high Dose 65+) 11/10/2020   Influenza, High Dose Seasonal PF 08/29/2019   Moderna Sars-Covid-2 Vaccination 01/01/2020, 01/26/2020, 11/03/2020

## 2021-07-26 NOTE — Telephone Encounter (Signed)
Sent prescription for prednisone 40 mg daily x 5 days. Sent script for symbicort 2 puffs twice a day. Can you please place a CXR order and see if she can come tomorrow morning to have it done? No increase in [name] for now, continue current dose.

## 2021-07-27 ENCOUNTER — Ambulatory Visit (INDEPENDENT_AMBULATORY_CARE_PROVIDER_SITE_OTHER): Payer: Medicare PPO

## 2021-07-27 DIAGNOSIS — J8410 Pulmonary fibrosis, unspecified: Secondary | ICD-10-CM | POA: Diagnosis not present

## 2021-07-27 DIAGNOSIS — I700 Atherosclerosis of aorta: Secondary | ICD-10-CM | POA: Diagnosis not present

## 2021-07-27 DIAGNOSIS — U071 COVID-19: Secondary | ICD-10-CM

## 2021-07-28 NOTE — Progress Notes (Signed)
CXR with interval worsening bilateral most prominently RLL infiltrates. Suspect related to recent COVID. Continue inhalers. Ok to resume increasing [name] as tolerated on normal schedule.

## 2021-07-31 DIAGNOSIS — J45909 Unspecified asthma, uncomplicated: Secondary | ICD-10-CM | POA: Diagnosis not present

## 2021-07-31 DIAGNOSIS — J9601 Acute respiratory failure with hypoxia: Secondary | ICD-10-CM | POA: Diagnosis not present

## 2021-08-03 ENCOUNTER — Ambulatory Visit: Payer: Medicare PPO | Admitting: Pulmonary Disease

## 2021-08-03 ENCOUNTER — Other Ambulatory Visit: Payer: Self-pay

## 2021-08-03 ENCOUNTER — Encounter: Payer: Self-pay | Admitting: Pulmonary Disease

## 2021-08-03 VITALS — BP 116/74 | HR 89 | Temp 98.0°F | Ht 65.0 in | Wt 188.4 lb

## 2021-08-03 DIAGNOSIS — J9611 Chronic respiratory failure with hypoxia: Secondary | ICD-10-CM

## 2021-08-03 DIAGNOSIS — J849 Interstitial pulmonary disease, unspecified: Secondary | ICD-10-CM

## 2021-08-03 DIAGNOSIS — I2729 Other secondary pulmonary hypertension: Secondary | ICD-10-CM

## 2021-08-03 MED ORDER — PREDNISONE 10 MG PO TABS: ORAL_TABLET | ORAL | 0 refills | Status: AC

## 2021-08-03 NOTE — Progress Notes (Signed)
_0  ID: Kristie [name], female    DOB: 28-Jun-1951, 70 y.o.   MRN: [id number]  Chief Complaint  Patient presents with   Follow-up    Patient  reports she had covid about a month ago and reports oxygen levels and had CXR 07/27/21.  8 puffs a day of [name].     Referring provider: Lowella Dandy, NP  HPI:   70 y.o. whom we are seeing for evaluation of pulmonary hypertension.  She comes to clinic today with worsening dyspnea on exertion.  She was diagnosed with COVID the first week or so August 2022.  Prior to that she was on [name] and increasing by 1 puff 4 times daily week.  She got to 8 puffs 4 times daily.  She reports mild improvement in her dyspnea on exertion.  She felt her oxygen saturation was staying up a bit higher.  She denied any development of lower extremity swelling, weight gain.  With COVID, she had severe symptoms.  Severe dyspnea on exertion.  Cough.  Fever.  She was treated a couple times with steroid courses with improvement temporarily in her cough and dyspnea exertion.  However, her hypoxemia has worsened.  Now dipping to the 70s at times.  Her husband admits she often does not increase to the 4 L with exertion she supposed to.  They do note that her oxygen saturation at rest is lower inside of low 90s will dip down to the high 80s, mid 80s.  She has been taking her ICS/LABA inhaler and says this improves her helps her cough.  She has ongoing junky, productive cough.  Yellow to green phlegm.  She had a chest x-ray/16/2022 with very vague radiology portal my review interpretation reveals worsened bilateral interstitial and alveolar filling infiltrates compared to 02/2021 particular right hilar/right middle lobe with obscuration of right heart border there reflects multifocal pneumonia on my interpretation.  Over the course of last several weeks while dealing with post COVID symptoms, she is continue [name] 8 puffs 4 times daily.  She denies any worsening lower extremity swelling.   In fact overall, her lower extremity swelling is improved with [name].   HPI at initial visit: Patient has ongoing dyspnea on exertion.  Worsening over time.  Now minimal activity such as walking on flat surfaces make things worse.  Worse with inclines or stairs.  No timing during the day were things are better or worse.  No seasonal or environmental changes that she can identify with things are better or worse.  No alleviating or exacerbating factors.  Review of records indicates 07/2019 CT high-res will review shows signs consistent with UIP on my evaluation.  Repeat high-res CT 01/2020 with stable findings my interpretation.  Most recent high-res CT 2/22 2 with mild progression of peripheral fibrosis without frank honeycombing on my interpretation.  TTE 01/2020 reviewed with normal RV function, RA size, mildly elevated PASP with mild mitral valve and aortic regurgitation.  Most recent TTE 04/6212 with diastolic dysfunction, mitral valve regurgitation, mild to moderate aortic valve regurgitation severely dilated RA, elevated right ultra pressure of 8, elevated PASP, enlarged RV with normal function all suggestive worsening pressure overload.  PMH: ILD, Sjogren's, connective tissue disease Surgical history: Spine surgery, gastric band and gastric sleeve resection Family history: Mother and father with CAD, sister with diabetes and CAD, brother with CAD Social history: She is a never smoker, lives with husband in [place] / Pulmonary Flowsheets:   ACT:  No  flowsheet data found.  MMRC: mMRC Dyspnea Scale mMRC Score  11/22/2020 3  02/06/2020 2    Epworth:  No flowsheet data found.  Tests:   FENO:  No results found for: NITRICOXIDE  PFT: PFT Results Latest Ref Rng & Units 08/03/2020 03/09/2020  FVC-Pre L 1.59 1.75  FVC-Predicted Pre % 50 54  FVC-Post L 1.74 1.74  FVC-Predicted Post % 54 54  Pre FEV1/FVC % % 95 76  Post FEV1/FCV % % 93 87  FEV1-Pre L 1.52 1.32   FEV1-Predicted Pre % 62 53  FEV1-Post L 1.61 1.51  DLCO uncorrected ml/min/mmHg 9.52 8.44  DLCO UNC% % 47 41  DLCO corrected ml/min/mmHg 9.52 9.01  DLCO COR %Predicted % 47 44  DLVA Predicted % 89 86  TLC L 2.75 2.75  TLC % Predicted % 53 52  RV % Predicted % 31 45  Personally reviewed and interpreted as spirometry suggestive of severe restriction, no significant bronchodilator response, TLC with moderate restriction 53% of predicted, DLCO severely reduced.  Combination of findings suggestive of parenchymal restrictive process.  WALK:  SIX MIN WALK 02/06/2020 09/18/2019 07/02/2019 05/29/2019  Medications albuterol neb sol 0.083%, atorvastatin 84m, famotidine 235m levothyroxine 12577m metoprolol 24m6montelukast 10mg69mrphine 15mg,11mran 4mg, p39moprazole 40mg at59m5am - - -  Supplimental Oxygen during Test? (L/min) No No - No  Laps 11 - - -  Partial Lap (in Meters) 0 - - -  Baseline BP (sitting) 130/78 - - -  Baseline Heartrate 84 - - -  Baseline Dyspnea (Borg Scale) 4 - - -  Baseline Fatigue (Borg Scale) 3 - - -  Baseline SPO2 99 - - -  BP (sitting) 166/88 - - -  Heartrate 116 - - -  Dyspnea (Borg Scale) 8 - - -  Fatigue (Borg Scale) 5 - - -  SPO2 94 - - -  BP (sitting) 160/82 - - -  Heartrate 86 - - -  SPO2 97 - - -  Stopped or Paused before Six Minutes No - - -  Interpretation Hip pain;Calf pain - - -  Distance Completed 374 - - -  Tech Comments: Pt walked at an average pace completing the entire 6 min without stopping. Pt did stagger some during the walk and also coughed throughout the walk. pt walked a very slow pace for approx 20 steps and stopped 3 x due to SOB and back pain//lmr average pace/SOB//lmr average pace/SOB//lmr    Imaging: Personally reviewed and as per EMR  Lab Results: Personally reviewed, notably eosinophils as high as 700 12/2020 CBC    Component Value Date/Time   WBC 13.0 (H) 01/05/2021 1212   RBC 3.59 (L) 01/05/2021 1212   HGB 10.9 (L)  01/05/2021 1212   HGB 13.2 03/26/2020 1425   HCT 33.5 (L) 01/05/2021 1212   PLT 453.0 (H) 01/05/2021 1212   PLT 431 (H) 03/26/2020 1425   MCV 93.6 01/05/2021 1212   MCH 29.1 03/26/2020 1425   MCHC 32.5 01/05/2021 1212   RDW 15.4 01/05/2021 1212   LYMPHSABS 1.9 01/05/2021 1212   MONOABS 1.1 (H) 01/05/2021 1212   EOSABS 0.7 01/05/2021 1212   BASOSABS 0.1 01/05/2021 1212    BMET    Component Value Date/Time   NA 136 03/26/2020 1425   K 3.8 03/26/2020 1425   CL 106 03/26/2020 1425   CO2 21 (L) 03/26/2020 1425   GLUCOSE 105 (H) 03/26/2020 1425   BUN 13 03/26/2020 1425   CREATININE 0.86 03/26/2020 1425  CALCIUM 8.9 03/26/2020 1425   GFRNONAA >60 03/26/2020 1425   GFRAA >60 03/26/2020 1425    BNP No results found for: BNP  ProBNP    Component Value Date/Time   PROBNP 139.0 (H) 05/29/2019 1118    Specialty Problems       Pulmonary Problems   Allergic rhinitis    Qualifier: Diagnosis of  By: Loanne Drilling MD, Hilliard Clark A       Chronic cough    Onset 2005 intermittent and evolved to chronic cough Jan 2020 on fosfamax -  D/c fosfamax and symb 160 05/29/2019 and max rx for reflux > resolved 07/02/2019       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 Jan 2020  - 05/29/2019   Walked RA  2 laps @  approx 262f each @ fast pace  stopped due to end of study c/o sob and sats down to 92%   - collagen vasc profile sent 05/29/2019 >  Pos for ANA with speckled pattern 1: 1280 and RA 19  - 07/02/2019   Walked RA  2 laps @  approx 2538feach @ avg pace  stopped due to  End of study with sats 91% at sob at end   - HRCT 07/24/2019 1. The appearance of the lungs remains compatible with interstitial lung disease, with a spectrum of findings considered probable usual interstitial pneumonia (UIP) per current ATS guidelines. However, given the stability compared to the prior examination, the possibility of chronic fibrotic phase nonspecific  interstitial pneumonia (NSIP) warrants consideration. - Rheum eval 07/16/19 by ErMarella ChimesA with multiple studies sent       DOE (dyspnea on exertion)    Onset around 2015 assoc with PF   09/18/2019   Walked RA x one lap =  approx 250 ft - stopped due to  Sob/back pain with sats 91% at end at very slow pace / freq stops      [place] disease[place]  Chronic respiratory failure with hypoxia (HCC)   Snoring    Allergies  Allergen Reactions   Penicillins Itching and Other (See Comments)    "Cillin" Family - Amoxicillin - itching    Did it involve swelling of the face/tongue/throat, SOB, or low BP? Yes Did it involve sudden or severe rash/hives, skin peeling, or any reaction on the inside of your mouth or nose? no Did you need to seek medical attention at a hospital or doctor's office? yes When did it last happen?      30 years If all above answers are "NO", may proceed with cephalosporin use.   Doxycycline Rash    Immunization History  Administered Date(s) Administered   Fluad Quad(high Dose 65+) 11/10/2020   Influenza, High Dose Seasonal PF 08/29/2019   Moderna Sars-Covid-2 Vaccination 01/01/2020, 01/26/2020, 11/03/2020    Past Medical History:  Diagnosis Date   Anxiety    Bronchitis    Fibromyalgia    Hypertension    PMH: 2010   Hypothyroidism    Seasonal allergies    Spinal stenosis    L-5 to S-1   Thyroid disease    Wears glasses     Tobacco History: Social History   Tobacco Use  Smoking Status Never  Smokeless Tobacco Never   Counseling given: Not Answered   Continue to not smoke  Outpatient Encounter Medications as of 08/03/2021  Medication Sig   albuterol (PROVENTIL) (2.5 MG/3ML) 0.083% nebulizer  solution Take 3 mLs (2.5 mg total) by nebulization every 4 (four) hours as needed for wheezing or shortness of breath.   albuterol (VENTOLIN HFA) 108 (90 Base) MCG/ACT inhaler Inhale 2 puffs into the lungs every 4 (four) hours as needed for  wheezing or shortness of breath.   amLODipine (NORVASC) 10 MG tablet Take 1 tablet by mouth daily.   amLODIPine-Valsartan-HCTZ 10-160-12.5 MG TABS    Ascorbic Acid (VITAMIN C) 100 MG tablet Take 100 mg by mouth daily.   aspirin EC 81 MG tablet Take 81 mg by mouth daily.   atorvastatin (LIPITOR) 80 MG tablet Take 80 mg by mouth daily.    azaTHIOprine (IMURAN) 50 MG tablet Take 179m daily   budesonide-formoterol (SYMBICORT) 160-4.5 MCG/ACT inhaler Inhale 2 puffs into the lungs 2 (two) times daily.   Calcium 250 MG CAPS Take 500 mg by mouth 2 (two) times daily.   citalopram (CELEXA) 20 MG tablet Take 20 mg by mouth daily.   empagliflozin (JARDIANCE) 10 MG TABS tablet Take 1 tablet by mouth daily.   gabapentin (NEURONTIN) 100 MG capsule    hydrOXYzine (ATARAX/VISTARIL) 25 MG tablet Take 25 mg by mouth at bedtime.    levothyroxine (SYNTHROID) 100 MCG tablet Take 100 mcg by mouth daily before breakfast.   lisinopril (ZESTRIL) 5 MG tablet Take by mouth.   loratadine-pseudoephedrine (CLARITIN-D 24-HOUR) 10-240 MG 24 hr tablet Take 1 tablet by mouth daily.   Magnesium 250 MG TABS Take 1 tablet by mouth at bedtime.   montelukast (SINGULAIR) 10 MG tablet Take 10 mg by mouth daily.    Multiple Vitamin (MULTIVITAMIN WITH MINERALS) TABS tablet Take 1 tablet by mouth daily.   nitroGLYCERIN (NITROSTAT) 0.4 MG SL tablet Place 0.4 mg under the tongue every 5 (five) minutes as needed for chest pain. AS NEEDED   ondansetron (ZOFRAN ODT) 4 MG disintegrating tablet Take 1 tablet (4 mg total) by mouth every 8 (eight) hours as needed for nausea or vomiting.   pantoprazole (PROTONIX) 40 MG tablet TAKE 1 TABLET DAILY 30 TO 60 MINUTES BEFORE FIRST MEAL OF THE DAY   pimecrolimus (ELIDEL) 1 % cream Apply 1 application topically 2 (two) times daily.   predniSONE (DELTASONE) 10 MG tablet Take 2 tablets (20 mg total) by mouth daily with breakfast for 7 days, THEN 1 tablet (10 mg total) daily with breakfast for 7 days.    ranolazine (RANEXA) 500 MG 12 hr tablet    sulfamethoxazole-trimethoprim (BACTRIM DS) 800-160 MG tablet TAKE 1 TABLET BY MOUTH 3 TIMES A WEEK   tiZANidine (ZANAFLEX) 4 MG capsule Take 1 capsule (4 mg total) by mouth 3 (three) times daily. (Patient taking differently: Take 4 mg by mouth 3 (three) times daily as needed.)   [DISCONTINUED] benzonatate (TESSALON) 100 MG capsule TAKE 1 CAPSULE(100 MG) BY MOUTH THREE TIMES DAILY AS NEEDED FOR COUGH (Patient not taking: Reported on 08/03/2021)   [DISCONTINUED] isosorbide dinitrate (ISORDIL) 30 MG tablet Take by mouth. (Patient not taking: Reported on 08/03/2021)   [DISCONTINUED] predniSONE (DELTASONE) 5 MG tablet Take by mouth.   No facility-administered encounter medications on file as of 08/03/2021.     Review of Systems  Review of Systems  N/A  Physical Exam  BP 116/74 (BP Location: Left Arm, Patient Position: Sitting, Cuff Size: Large)   Pulse 89   Temp 98 F (36.7 C) (Oral)   Ht _0  (1.651 m)   Wt 188 lb 6.4 oz (85.5 kg)   SpO2 94%   BMI  31.35 kg/m   Wt Readings from Last 5 Encounters:  08/03/21 188 lb 6.4 oz (85.5 kg)  05/23/21 174 lb (78.9 kg)  04/17/21 164 lb (74.4 kg)  04/04/21 164 lb (74.4 kg)  02/15/21 171 lb 9.6 oz (77.8 kg)    BMI Readings from Last 5 Encounters:  08/03/21 31.35 kg/m  05/23/21 28.96 kg/m  04/17/21 27.29 kg/m  04/04/21 27.29 kg/m  02/15/21 28.56 kg/m     Physical Exam General: Well-appearing, no acute distress Eyes: EOMI, no icterus Neck: Supple, no JVP Pulmonary: Very fine crackles in bilateral bases to midlung fields, otherwise clear, normal work of breathing on oxygen Cardiovascular: Tachycardic, regular rhythm, no murmurs Abdomen: Nondistended, bowel sounds present MSK: No synovitis, joint effusion Neuro: No weakness, sensation intact Psych: Normal mood, full affect   Assessment & Plan:   Worsening dyspnea exertion/hypoxemia: In the setting of COVID-19 infection early 07/2021.   Chest x-ray consistent with multifocal pneumonia, related to COVID-19.  She has productive cough improved with ICS/LABA.  Symptoms improved with prednisone.  She has significant eosinophilia in the past.  Some atopic symptoms.  Suspect unmasking of asthma in the setting of viral pneumonia due to COVID-19.  We will repeat chest x-ray in the coming weeks to assess resolution.  No change in medications.  She is to check her oxygen more closely and keep a close eye on how much oxygen she is using.  Advised increasing 5 L with exertion.  If oxygen saturations are not adequate despite this, will need to do 6-minute walk test, assess for additional oxygen supplementation at home as she recovers.  Concern for PV OD given [name].  However timeline of symptoms correlate better with acute viral pneumonia that has become more more severe in the setting of immunosuppression due to Imuran for her ILD.  Pulmonary hypertension: Suspect contributions largely from group 3 disease given hypoxemia, interstitial lung disease.  No OSA on polysomnography and no evidence of chronic PE on VQ scan.  Given significant group 3 disease and parenchymal disease, prescribed [name] 05/2021 in effort to minimize VQ mismatch.   Dry weight approximately 172 pounds.  Difficult to assess given she is starting to gain weight after significant diarrhea and malnutrition.  She has worsening dyspnea and hypoxemia since diagnosed with COVID early 07/2021.  Chest x-ray consistent with pneumonia as opposed to pulmonary edema.  Some concern for unmasking of PV OD.  However, she shows no worsening signs of right-sided heart failure, no JVP on exam and no lower extremity swelling.  Overall she reports objective improvement in lower extremity swelling since initiating [name].  Weight is up that she was already gaining weight after significant malnutrition during long [facility] hospitalization earlier this year has been on multiple prednisone courses.  Suspect response  to prednisone related to asthma as opposed to PV OD.  We will keep current [name] dose 8 puffs 4 times daily.  If she does not improve in the coming weeks and chest x-ray does not improve, we will have to consider [name] exacerbation of underlying PV OD is a possibility and down titrate [name] and assess response.   Return in about 4 weeks (around 08/31/2021).   Lanier Clam, MD 08/03/2021   This appointment required 50 minutes of patient care (this includes precharting, chart review, review of results, face-to-face care, etc.).

## 2021-08-03 NOTE — Patient Instructions (Signed)
Nice to see you again  I am sorry you are not feeling well after your COVID infection a few weeks ago.  For now, no changes to medication, keep [name] at 8 puffs 4 times a day.  Take prednisone 20 mg for 5 days then 10 mg for 5 days and stop  We will get a chest x-ray in the middle of September and help decide neck steps with the medication.  Return to clinic in 1 month Dr. Silas Flood

## 2021-08-04 DIAGNOSIS — I2723 Pulmonary hypertension due to lung diseases and hypoxia: Secondary | ICD-10-CM | POA: Diagnosis not present

## 2021-08-10 DIAGNOSIS — D72829 Elevated white blood cell count, unspecified: Secondary | ICD-10-CM | POA: Diagnosis not present

## 2021-08-10 DIAGNOSIS — M797 Fibromyalgia: Secondary | ICD-10-CM | POA: Diagnosis not present

## 2021-08-10 DIAGNOSIS — G894 Chronic pain syndrome: Secondary | ICD-10-CM | POA: Diagnosis not present

## 2021-08-10 DIAGNOSIS — M3500 Sicca syndrome, unspecified: Secondary | ICD-10-CM | POA: Diagnosis not present

## 2021-08-10 DIAGNOSIS — I272 Pulmonary hypertension, unspecified: Secondary | ICD-10-CM | POA: Diagnosis not present

## 2021-08-10 DIAGNOSIS — J849 Interstitial pulmonary disease, unspecified: Secondary | ICD-10-CM | POA: Diagnosis not present

## 2021-08-17 DIAGNOSIS — J9601 Acute respiratory failure with hypoxia: Secondary | ICD-10-CM | POA: Diagnosis not present

## 2021-08-17 DIAGNOSIS — J45909 Unspecified asthma, uncomplicated: Secondary | ICD-10-CM | POA: Diagnosis not present

## 2021-08-31 DIAGNOSIS — J45909 Unspecified asthma, uncomplicated: Secondary | ICD-10-CM | POA: Diagnosis not present

## 2021-08-31 DIAGNOSIS — J9601 Acute respiratory failure with hypoxia: Secondary | ICD-10-CM | POA: Diagnosis not present

## 2021-09-02 DIAGNOSIS — H353130 Nonexudative age-related macular degeneration, bilateral, stage unspecified: Secondary | ICD-10-CM | POA: Diagnosis not present

## 2021-09-02 DIAGNOSIS — H2513 Age-related nuclear cataract, bilateral: Secondary | ICD-10-CM | POA: Diagnosis not present

## 2021-09-02 DIAGNOSIS — M3501 Sicca syndrome with keratoconjunctivitis: Secondary | ICD-10-CM | POA: Diagnosis not present

## 2021-09-02 DIAGNOSIS — H5213 Myopia, bilateral: Secondary | ICD-10-CM | POA: Diagnosis not present

## 2021-09-02 DIAGNOSIS — H52223 Regular astigmatism, bilateral: Secondary | ICD-10-CM | POA: Diagnosis not present

## 2021-09-05 DIAGNOSIS — J8410 Pulmonary fibrosis, unspecified: Secondary | ICD-10-CM | POA: Diagnosis not present

## 2021-09-05 DIAGNOSIS — I2723 Pulmonary hypertension due to lung diseases and hypoxia: Secondary | ICD-10-CM | POA: Diagnosis not present

## 2021-09-06 ENCOUNTER — Other Ambulatory Visit: Payer: Self-pay

## 2021-09-06 ENCOUNTER — Ambulatory Visit (INDEPENDENT_AMBULATORY_CARE_PROVIDER_SITE_OTHER): Payer: Medicare PPO

## 2021-09-06 ENCOUNTER — Encounter: Payer: Self-pay | Admitting: Pulmonary Disease

## 2021-09-06 ENCOUNTER — Ambulatory Visit: Payer: Medicare PPO | Admitting: Pulmonary Disease

## 2021-09-06 VITALS — BP 114/66 | HR 87 | Temp 98.4°F | Ht 65.0 in | Wt 195.6 lb

## 2021-09-06 DIAGNOSIS — I272 Pulmonary hypertension, unspecified: Secondary | ICD-10-CM | POA: Diagnosis not present

## 2021-09-06 DIAGNOSIS — R0609 Other forms of dyspnea: Secondary | ICD-10-CM

## 2021-09-06 DIAGNOSIS — R0600 Dyspnea, unspecified: Secondary | ICD-10-CM

## 2021-09-06 NOTE — Progress Notes (Signed)
_0  ID: Kristie [name], female    DOB: 05-15-1951, 70 y.o.   MRN: [id number]  Chief Complaint  Patient presents with   Follow-up    Horse voice Coughing up clear phelm  O2 on 4-5 when up doing things    Referring provider: Lowella Dandy, NP  HPI:   70 y.o. whom we are seeing for evaluation of pulmonary hypertension, group 3 disease, currently on [name] started 06/2021.    Recent clinic visit 07/2021 in the setting of worsening respiratory issues, hypoxemia.  Desatted to the 71s.  In the setting of COVID infection.  Long discussion that felt this was likely sequela of severe COVID infection.  Chest x-ray at that time did reveal bilateral infiltrates consistent with atypical pneumonia.  Discussed the possibility of PV OD and [name] make things worse.  This seems less likely given her stable weight, lack of lower extremity swelling.  In the interval, she is slowly improved.  Less dyspneic.  Oxygen saturations staying in the 80s at worst.  sStll hoarse.  Less dyspneic than at last visit but overall more dyspneic compared to pre-COVID symptoms.  She remains on [name] 8 puffs 4 times daily.  Discussed that given her improved symptoms on [name] at 8 and likely related to COVID worsening symptoms, to increase [name] to 9 puffs 4 times daily starting next week.  She expressed understanding.  We will repeat chest x-ray today.   HPI at initial visit: Patient has ongoing dyspnea on exertion.  Worsening over time.  Now minimal activity such as walking on flat surfaces make things worse.  Worse with inclines or stairs.  No timing during the day were things are better or worse.  No seasonal or environmental changes that she can identify with things are better or worse.  No alleviating or exacerbating factors.  Review of records indicates 07/2019 CT high-res will review shows signs consistent with UIP on my evaluation.  Repeat high-res CT 01/2020 with stable findings my interpretation.  Most recent high-res CT  2/22 2 with mild progression of peripheral fibrosis without frank honeycombing on my interpretation.  TTE 01/2020 reviewed with normal RV function, RA size, mildly elevated PASP with mild mitral valve and aortic regurgitation.  Most recent TTE 01/4096 with diastolic dysfunction, mitral valve regurgitation, mild to moderate aortic valve regurgitation severely dilated RA, elevated right ultra pressure of 8, elevated PASP, enlarged RV with normal function all suggestive worsening pressure overload.  PMH: ILD, Sjogren's, connective tissue disease Surgical history: Spine surgery, gastric band and gastric sleeve resection Family history: Mother and father with CAD, sister with diabetes and CAD, brother with CAD Social history: She is a never smoker, lives with husband in [place] / Pulmonary Flowsheets:   ACT:  No flowsheet data found.  MMRC: mMRC Dyspnea Scale mMRC Score  11/22/2020 3  02/06/2020 2    Epworth:  No flowsheet data found.  Tests:   FENO:  No results found for: NITRICOXIDE  PFT: PFT Results Latest Ref Rng & Units 08/03/2020 03/09/2020  FVC-Pre L 1.59 1.75  FVC-Predicted Pre % 50 54  FVC-Post L 1.74 1.74  FVC-Predicted Post % 54 54  Pre FEV1/FVC % % 95 76  Post FEV1/FCV % % 93 87  FEV1-Pre L 1.52 1.32  FEV1-Predicted Pre % 62 53  FEV1-Post L 1.61 1.51  DLCO uncorrected ml/min/mmHg 9.52 8.44  DLCO UNC% % 47 41  DLCO corrected ml/min/mmHg 9.52 9.01  DLCO COR %Predicted % 47 44  DLVA Predicted % 89 86  TLC L 2.75 2.75  TLC % Predicted % 53 52  RV % Predicted % 31 45  Personally reviewed and interpreted as spirometry suggestive of severe restriction, no significant bronchodilator response, TLC with moderate restriction 53% of predicted, DLCO severely reduced.  Combination of findings suggestive of parenchymal restrictive process.  WALK:  SIX MIN WALK 02/06/2020 09/18/2019 07/02/2019 05/29/2019  Medications albuterol neb sol 0.083%, atorvastatin 54m,  famotidine 240m levothyroxine 12522m metoprolol 73m77montelukast 10mg56mrphine 15mg,28mran 4mg, p24moprazole 40mg at67m5am - - -  Supplimental Oxygen during Test? (L/min) No No - No  Laps 11 - - -  Partial Lap (in Meters) 0 - - -  Baseline BP (sitting) 130/78 - - -  Baseline Heartrate 84 - - -  Baseline Dyspnea (Borg Scale) 4 - - -  Baseline Fatigue (Borg Scale) 3 - - -  Baseline SPO2 99 - - -  BP (sitting) 166/88 - - -  Heartrate 116 - - -  Dyspnea (Borg Scale) 8 - - -  Fatigue (Borg Scale) 5 - - -  SPO2 94 - - -  BP (sitting) 160/82 - - -  Heartrate 86 - - -  SPO2 97 - - -  Stopped or Paused before Six Minutes No - - -  Interpretation Hip pain;Calf pain - - -  Distance Completed 374 - - -  Tech Comments: Pt walked at an average pace completing the entire 6 min without stopping. Pt did stagger some during the walk and also coughed throughout the walk. pt walked a very slow pace for approx 20 steps and stopped 3 x due to SOB and back pain//lmr average pace/SOB//lmr average pace/SOB//lmr    Imaging: Personally reviewed and as per EMR  Lab Results: Personally reviewed, notably eosinophils as high as 700 12/2020 CBC    Component Value Date/Time   WBC 13.0 (H) 01/05/2021 1212   RBC 3.59 (L) 01/05/2021 1212   HGB 10.9 (L) 01/05/2021 1212   HGB 13.2 03/26/2020 1425   HCT 33.5 (L) 01/05/2021 1212   PLT 453.0 (H) 01/05/2021 1212   PLT 431 (H) 03/26/2020 1425   MCV 93.6 01/05/2021 1212   MCH 29.1 03/26/2020 1425   MCHC 32.5 01/05/2021 1212   RDW 15.4 01/05/2021 1212   LYMPHSABS 1.9 01/05/2021 1212   MONOABS 1.1 (H) 01/05/2021 1212   EOSABS 0.7 01/05/2021 1212   BASOSABS 0.1 01/05/2021 1212    BMET    Component Value Date/Time   NA 136 03/26/2020 1425   K 3.8 03/26/2020 1425   CL 106 03/26/2020 1425   CO2 21 (L) 03/26/2020 1425   GLUCOSE 105 (H) 03/26/2020 1425   BUN 13 03/26/2020 1425   CREATININE 0.86 03/26/2020 1425   CALCIUM 8.9 03/26/2020 1425   GFRNONAA >60  03/26/2020 1425   GFRAA >60 03/26/2020 1425    BNP No results found for: BNP  ProBNP    Component Value Date/Time   PROBNP 139.0 (H) 05/29/2019 1118    Specialty Problems       Pulmonary Problems   Allergic rhinitis    Qualifier: Diagnosis of  By: Ellison Loanne Drillingn A       Chronic cough    Onset 2005 intermittent and evolved to chronic cough Jan 2020 on fosfamax -  D/c fosfamax and symb 160 05/29/2019 and max rx for reflux > resolved 07/02/2019       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 Jan 2020  - 05/29/2019   Walked RA  2 laps @  approx 242f each @ fast pace  stopped due to end of study c/o sob and sats down to 92%   - collagen vasc profile sent 05/29/2019 >  Pos for ANA with speckled pattern 1: 1280 and RA 19  - 07/02/2019   Walked RA  2 laps @  approx 2547feach @ avg pace  stopped due to  End of study with sats 91% at sob at end   - HRCT 07/24/2019 1. The appearance of the lungs remains compatible with interstitial lung disease, with a spectrum of findings considered probable usual interstitial pneumonia (UIP) per current ATS guidelines. 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 - Rheum eval 07/16/19 by ErMarella ChimesA with multiple studies sent       DOE (dyspnea on exertion)    Onset around 2015 assoc with PF   09/18/2019   Walked RA x one lap =  approx 250 ft - stopped due to  Sob/back pain with sats 91% at end at very slow pace / freq stops      ILD [place]interstitial lung disease) ([facility] [place]  Chronic respiratory failure with hypoxia (HCC)   Snoring    Allergies  Allergen Reactions   Penicillins Itching and Other (See Comments)    "Cillin" Family - Amoxicillin - itching    Did it involve swelling of the face/tongue/throat, SOB, or low BP? Yes Did it involve sudden or severe rash/hives, skin  peeling, or any reaction on the inside of your mouth or nose? no Did you need to seek medical attention at a hospital or doctor's office? yes When did it last happen?      30 years If all above answers are "NO", may proceed with cephalosporin use.   Doxycycline Rash    Immunization History  Administered Date(s) Administered   Fluad Quad(high Dose 65+) 11/10/2020   Influenza, High Dose Seasonal PF 08/29/2019   Moderna Sars-Covid-2 Vaccination 01/01/2020, 01/26/2020, 11/03/2020    Past Medical History:  Diagnosis Date   Anxiety    Bronchitis    Fibromyalgia    Hypertension    PMH: 2010   Hypothyroidism    Seasonal allergies    Spinal stenosis    L-5 to S-1   Thyroid disease    Wears glasses     Tobacco History: Social History   Tobacco Use  Smoking Status Never  Smokeless Tobacco Never   Counseling given: Not Answered   Continue to not smoke  Outpatient Encounter Medications as of 09/06/2021  Medication Sig   albuterol (PROVENTIL) (2.5 MG/3ML) 0.083% nebulizer solution Take 3 mLs (2.5 mg total) by nebulization every 4 (four) hours as needed for wheezing or shortness of breath.   albuterol (VENTOLIN HFA) 108 (90 Base) MCG/ACT inhaler Inhale 2 puffs into the lungs every 4 (four) hours as needed for wheezing or shortness of breath.   Ascorbic Acid (VITAMIN C) 100 MG tablet Take 100 mg by mouth daily.   aspirin EC 81 MG tablet Take 81 mg by mouth daily.   atorvastatin (LIPITOR) 80 MG tablet Take 80 mg by mouth daily.    azaTHIOprine (IMURAN) 50 MG tablet Take 15031maily   budesonide-formoterol (SYMBICORT) 160-4.5 MCG/ACT inhaler Inhale 2 puffs into the lungs 2 (two) times daily.   Calcium 250 MG  CAPS Take 500 mg by mouth 2 (two) times daily.   citalopram (CELEXA) 20 MG tablet Take 20 mg by mouth daily.   empagliflozin (JARDIANCE) 10 MG TABS tablet Take 1 tablet by mouth daily.   gabapentin (NEURONTIN) 100 MG capsule    levothyroxine (SYNTHROID) 100 MCG tablet Take 100  mcg by mouth daily before breakfast.   lisinopril (ZESTRIL) 5 MG tablet Take by mouth.   loratadine-pseudoephedrine (CLARITIN-D 24-HOUR) 10-240 MG 24 hr tablet Take 1 tablet by mouth daily.   Magnesium 250 MG TABS Take 1 tablet by mouth at bedtime.   montelukast (SINGULAIR) 10 MG tablet Take 10 mg by mouth daily.    pantoprazole (PROTONIX) 40 MG tablet TAKE 1 TABLET DAILY 30 TO 60 MINUTES BEFORE FIRST MEAL OF THE DAY   pimecrolimus (ELIDEL) 1 % cream Apply 1 application topically 2 (two) times daily.   ranolazine (RANEXA) 500 MG 12 hr tablet    sulfamethoxazole-trimethoprim (BACTRIM DS) 800-160 MG tablet TAKE 1 TABLET BY MOUTH 3 TIMES A WEEK   tiZANidine (ZANAFLEX) 4 MG capsule Take 1 capsule (4 mg total) by mouth 3 (three) times daily. (Patient taking differently: Take 4 mg by mouth 3 (three) times daily as needed.)   amLODipine (NORVASC) 10 MG tablet Take 1 tablet by mouth daily.   amLODIPine-Valsartan-HCTZ 10-160-12.5 MG TABS    hydrOXYzine (ATARAX/VISTARIL) 25 MG tablet Take 25 mg by mouth at bedtime.  (Patient not taking: Reported on 09/06/2021)   Multiple Vitamin (MULTIVITAMIN WITH MINERALS) TABS tablet Take 1 tablet by mouth daily. (Patient not taking: Reported on 09/06/2021)   nitroGLYCERIN (NITROSTAT) 0.4 MG SL tablet Place 0.4 mg under the tongue every 5 (five) minutes as needed for chest pain. AS NEEDED (Patient not taking: Reported on 09/06/2021)   ondansetron (ZOFRAN ODT) 4 MG disintegrating tablet Take 1 tablet (4 mg total) by mouth every 8 (eight) hours as needed for nausea or vomiting. (Patient not taking: Reported on 09/06/2021)   No facility-administered encounter medications on file as of 09/06/2021.     Review of Systems  Review of Systems  N/A  Physical Exam  BP 114/66 (BP Location: Left Arm, Cuff Size: Large)   Pulse 87   Temp 98.4 F (36.9 C) (Oral)   Ht _0  (1.651 m)   Wt 195 lb 9.6 oz (88.7 kg)   SpO2 91%   BMI 32.55 kg/m   Wt Readings from Last 5  Encounters:  09/06/21 195 lb 9.6 oz (88.7 kg)  08/03/21 188 lb 6.4 oz (85.5 kg)  05/23/21 174 lb (78.9 kg)  04/17/21 164 lb (74.4 kg)  04/04/21 164 lb (74.4 kg)    BMI Readings from Last 5 Encounters:  09/06/21 32.55 kg/m  08/03/21 31.35 kg/m  05/23/21 28.96 kg/m  04/17/21 27.29 kg/m  04/04/21 27.29 kg/m     Physical Exam General: Well-appearing, no acute distress Eyes: EOMI, no icterus Neck: Supple, no JVP Pulmonary: Very fine crackles in bilateral bases, otherwise clear, normal work of breathing on oxygen Cardiovascular: Tachycardic, regular rhythm, no murmurs Abdomen: Nondistended, bowel sounds present MSK: No synovitis, joint effusion Neuro: No weakness, sensation intact Psych: Normal mood, full affect   Assessment & Plan:   Pulmonary hypertension: Suspect contributions largely from group 3 disease given hypoxemia, interstitial lung disease.  No OSA on polysomnography and no evidence of chronic PE on VQ scan.  Given significant group 3 disease and parenchymal disease, prescribed [name] 05/2021 in effort to minimize VQ mismatch.   Dry weight approximately  172 pounds.  Difficult to assess given she is starting to gain weight after significant diarrhea and malnutrition.  She has worsening dyspnea and hypoxemia since diagnosed with COVID early 07/2021.  Chest x-ray consistent with pneumonia as opposed to pulmonary edema.  I had some concern for unmasking of PV OD.  However, she shows no worsening signs of right-sided heart failure, no JVP on exam and no lower extremity swelling.  Overall she reports objective improvement in lower extremity swelling since initiating [name].  Suspect worsening in setting of COVID.  We held [name] dose at 8 puffs 4 times daily.  Gradually over the last several weeks she is improved.  Less hypoxemic.  Less dyspneic.  Confident this is not PV OD or other reason for her decompensation.  Related to COVID-pneumonia.  We will increase [name] to 9 puffs 4  times daily next week and plan with ongoing titration.  Counseled her that most of it was seen when patients got to t 10 puffs or above in trials.  Chronic hypoxemic respite failure: To continue 4 L with exertion, 2 to 3 L with rest.  Stable.  Related to parenchymal changes.  No active ILD per serial CT scans and consultation with Dr. Vaughan Browner, prior pulmonologist.   Return in about 2 months (around 11/06/2021).   Lanier Clam, MD 09/06/2021   This appointment required 45 minutes of patient care (this includes precharting, chart review, review of results, face-to-face care, etc.).

## 2021-09-06 NOTE — Patient Instructions (Signed)
Nice to see you again  I am glad you are feeling a bit better  On Monday, increase [name] to 9 puffs 4 times a day.  We will plan to increase by 1 puff every week.  The goal is 12 puffs.  Maximum benefit is seen once we get to 10 or more puffs.  Return to clinic in 2 months or sooner as needed

## 2021-09-13 DIAGNOSIS — J849 Interstitial pulmonary disease, unspecified: Secondary | ICD-10-CM | POA: Diagnosis not present

## 2021-09-13 DIAGNOSIS — D539 Nutritional anemia, unspecified: Secondary | ICD-10-CM | POA: Diagnosis not present

## 2021-09-13 DIAGNOSIS — E039 Hypothyroidism, unspecified: Secondary | ICD-10-CM | POA: Diagnosis not present

## 2021-09-13 DIAGNOSIS — I2721 Secondary pulmonary arterial hypertension: Secondary | ICD-10-CM | POA: Diagnosis not present

## 2021-09-13 DIAGNOSIS — G8929 Other chronic pain: Secondary | ICD-10-CM | POA: Diagnosis not present

## 2021-09-13 DIAGNOSIS — E785 Hyperlipidemia, unspecified: Secondary | ICD-10-CM | POA: Diagnosis not present

## 2021-09-13 DIAGNOSIS — Z23 Encounter for immunization: Secondary | ICD-10-CM | POA: Diagnosis not present

## 2021-09-13 DIAGNOSIS — I2510 Atherosclerotic heart disease of native coronary artery without angina pectoris: Secondary | ICD-10-CM | POA: Diagnosis not present

## 2021-09-13 DIAGNOSIS — M549 Dorsalgia, unspecified: Secondary | ICD-10-CM | POA: Diagnosis not present

## 2021-09-13 DIAGNOSIS — I10 Essential (primary) hypertension: Secondary | ICD-10-CM | POA: Diagnosis not present

## 2021-09-13 DIAGNOSIS — R300 Dysuria: Secondary | ICD-10-CM | POA: Diagnosis not present

## 2021-09-16 DIAGNOSIS — J9601 Acute respiratory failure with hypoxia: Secondary | ICD-10-CM | POA: Diagnosis not present

## 2021-09-16 DIAGNOSIS — J45909 Unspecified asthma, uncomplicated: Secondary | ICD-10-CM | POA: Diagnosis not present

## 2021-09-22 DIAGNOSIS — H353131 Nonexudative age-related macular degeneration, bilateral, early dry stage: Secondary | ICD-10-CM | POA: Diagnosis not present

## 2021-09-22 DIAGNOSIS — H2511 Age-related nuclear cataract, right eye: Secondary | ICD-10-CM | POA: Diagnosis not present

## 2021-09-23 DIAGNOSIS — F419 Anxiety disorder, unspecified: Secondary | ICD-10-CM | POA: Diagnosis not present

## 2021-09-23 DIAGNOSIS — I509 Heart failure, unspecified: Secondary | ICD-10-CM | POA: Diagnosis not present

## 2021-09-23 DIAGNOSIS — I110 Hypertensive heart disease with heart failure: Secondary | ICD-10-CM | POA: Diagnosis not present

## 2021-09-23 DIAGNOSIS — E785 Hyperlipidemia, unspecified: Secondary | ICD-10-CM | POA: Diagnosis not present

## 2021-09-23 DIAGNOSIS — J8410 Pulmonary fibrosis, unspecified: Secondary | ICD-10-CM | POA: Diagnosis not present

## 2021-09-23 DIAGNOSIS — J9610 Chronic respiratory failure, unspecified whether with hypoxia or hypercapnia: Secondary | ICD-10-CM | POA: Diagnosis not present

## 2021-09-23 DIAGNOSIS — E039 Hypothyroidism, unspecified: Secondary | ICD-10-CM | POA: Diagnosis not present

## 2021-09-23 DIAGNOSIS — I2510 Atherosclerotic heart disease of native coronary artery without angina pectoris: Secondary | ICD-10-CM | POA: Diagnosis not present

## 2021-09-23 DIAGNOSIS — F325 Major depressive disorder, single episode, in full remission: Secondary | ICD-10-CM | POA: Diagnosis not present

## 2021-09-27 ENCOUNTER — Telehealth: Payer: Self-pay | Admitting: Pulmonary Disease

## 2021-09-28 DIAGNOSIS — I2723 Pulmonary hypertension due to lung diseases and hypoxia: Secondary | ICD-10-CM | POA: Diagnosis not present

## 2021-09-28 NOTE — Telephone Encounter (Signed)
[place] to inquire about [name] question. Per rep, patient stated that our office enrolled patient into grant but per chart review we have not done so. Patient was receiving [name] through Part B benefit and not through pharmacy benefit. Transferred to insurance/patient assistance advocate - we discussed in detail and rep looked through claims since July 2022 when she started [name] to see where bill came from. Patient's [employer] has been billed this whole time but patient only received bill starting in August but rep unable to explain why. They have "[employer]" on file but rep is unsure if this has been billed and said that the grant does not have an annual maximum limit on it- from my understanding patient was enrolled into patient assistance to receive [name] at no cost from [employer]  Per [place] [phone]), patient is not in their system.  Patient is expected to receive next shipment tomorrow, 09/29/21  Billing department [place]: [phone]  Called patient to advise her to reach out to billing for any questions about a bill because we did not enroll her into a grant - provided her with billing department phone number.  [facility] would have enrolled her. Advised that she should let them know that she cannot afford to pay this bill and they should assist with getting her enrolled into grant or patient assistance  Knox Saliva, PharmD, MPH, BCPS Clinical Pharmacist (Rheumatology and Pulmonology)

## 2021-09-29 NOTE — Telephone Encounter (Signed)
Sent message to [facility]/ [name] Case Coordinator , Radene Gunning to see if she could assist or provide more insight.

## 2021-09-30 DIAGNOSIS — J9601 Acute respiratory failure with hypoxia: Secondary | ICD-10-CM | POA: Diagnosis not present

## 2021-09-30 DIAGNOSIS — J45909 Unspecified asthma, uncomplicated: Secondary | ICD-10-CM | POA: Diagnosis not present

## 2021-10-04 ENCOUNTER — Other Ambulatory Visit: Payer: Self-pay | Admitting: Pulmonary Disease

## 2021-10-04 ENCOUNTER — Telehealth: Payer: Self-pay | Admitting: Pulmonary Disease

## 2021-10-04 DIAGNOSIS — J849 Interstitial pulmonary disease, unspecified: Secondary | ICD-10-CM

## 2021-10-04 NOTE — Telephone Encounter (Signed)
Preoperative risk assessment provided.  Surgery at the discretion of the surgeon.

## 2021-10-04 NOTE — Telephone Encounter (Signed)
OV notes and phone notes have been faxed to [facility] and confirmation received. Nothing further needed at this time.

## 2021-10-04 NOTE — Telephone Encounter (Signed)
Lm for Caryl Pina with [facility].  Mandi, have you received form?

## 2021-10-04 NOTE — Telephone Encounter (Signed)
Received request for pre-operative evaluation for cataract excision under IV sedation.  I did not provide clearance but rather a preoperative risk assessment.  Per ARISCAT risk calculator, she is lower 1.3% risk of postoperative pulmonary complication based on age, preoperative O2 sat of 91 to 95%, presuming anemia or hemoglobin less than 10 (in the tens 12/2020), peripheral nature of incision, operation time than 2 hours.  However, this may underestimate her risk given her underlying Pulmonary hypertension.  Advised frequent blood pressure checks as IV sedation could cause hypotension which could be exacerbated by inhaled pulmonary vasodilator.  Cautious with fluid boluses if needed.  There are no modifiable risk factors that I can identify that need to be addressed prior to surgery.

## 2021-10-04 NOTE — Telephone Encounter (Signed)
Fax received from Dr. Sherald Hess to perform a Cataract Extraction by PE, IOL Right then Left under IV Sedation with Dr. Adela Ports.  Patient needs surgery clearance. Patient was just seen 09/06/2021. Office protocol is a risk assessment can be sent to surgeon if patient has been seen in 60 days or less.   Sending to Dr. Silas Flood for risk assessment. Please addend OV notes or put clearance in phone note and route back to me or procedure pool please

## 2021-10-10 MED ORDER — AZATHIOPRINE 50 MG PO TABS: 150.0000 mg | ORAL_TABLET | Freq: Every day | ORAL | 5 refills | Status: DC

## 2021-10-10 NOTE — Telephone Encounter (Signed)
[facility]  Called patient to review. Unable to reach but left VM to indicate she can continue taking as she has been doing  DKnox Saliva PharmD, MPH, BCPS Clinical Pharmacist (Rheumatology and Pulmonology)

## 2021-10-10 NOTE — Addendum Note (Signed)
Addended by: Cassandria Anger on: 10/10/2021 03:25 PM   Modules accepted: Orders

## 2021-10-10 NOTE — Telephone Encounter (Signed)
Called patient to review [name] and she stated concern about her azathioprine.  She was previously taking azathioprine 8m three times daily (total daily dose of 1543m but new prescription was sent for azathioprine 10075maily without clear reasoning. Routing to Dr. ManLucien Monsnion, CMA who pended rx  Advised patient to continue azathioprine 27m36mree times daily as before  DevkKnox SalivaarmD, MPH, BCPS Clinical Pharmacist (Rheumatology and Pulmonology)

## 2021-10-17 DIAGNOSIS — J8410 Pulmonary fibrosis, unspecified: Secondary | ICD-10-CM | POA: Diagnosis not present

## 2021-10-17 DIAGNOSIS — H2511 Age-related nuclear cataract, right eye: Secondary | ICD-10-CM | POA: Diagnosis not present

## 2021-10-17 DIAGNOSIS — J45909 Unspecified asthma, uncomplicated: Secondary | ICD-10-CM | POA: Diagnosis not present

## 2021-10-17 DIAGNOSIS — J9601 Acute respiratory failure with hypoxia: Secondary | ICD-10-CM | POA: Diagnosis not present

## 2021-10-17 DIAGNOSIS — I2723 Pulmonary hypertension due to lung diseases and hypoxia: Secondary | ICD-10-CM | POA: Diagnosis not present

## 2021-10-31 DIAGNOSIS — J45909 Unspecified asthma, uncomplicated: Secondary | ICD-10-CM | POA: Diagnosis not present

## 2021-10-31 DIAGNOSIS — J9601 Acute respiratory failure with hypoxia: Secondary | ICD-10-CM | POA: Diagnosis not present

## 2021-11-07 ENCOUNTER — Telehealth: Payer: Self-pay | Admitting: Pulmonary Disease

## 2021-11-07 DIAGNOSIS — H2512 Age-related nuclear cataract, left eye: Secondary | ICD-10-CM | POA: Diagnosis not present

## 2021-11-09 ENCOUNTER — Other Ambulatory Visit: Payer: Self-pay | Admitting: Pulmonary Disease

## 2021-11-14 NOTE — Telephone Encounter (Signed)
Returned call to patient who states that she was told by [facility] that medication was covered 100%. She states she received a bill today $1041.07 that is dated from 07/05/21. She was told she had $50 copay per month from insurance but is unsure why she is getting bills.  Advised her to call insurance to determine if she has coinsurance/deductible that she's responsible for.  Reached out to Butch Penny, our [facility] rep, for advisement if we need to pursue [facility] PAP  Knox Saliva, PharmD, MPH, BCPS Clinical Pharmacist (Rheumatology and Pulmonology)

## 2021-11-14 NOTE — Telephone Encounter (Signed)
F/u in newer telephone encounter. Per Butch Penny, patient was advised to call insurance to determine if she has deductible to meet.  Knox Saliva, PharmD, MPH, BCPS Clinical Pharmacist (Rheumatology and Pulmonology)

## 2021-11-18 ENCOUNTER — Telehealth: Payer: Self-pay | Admitting: Pulmonary Disease

## 2021-11-18 MED ORDER — PANTOPRAZOLE SODIUM 40 MG PO TBEC: DELAYED_RELEASE_TABLET | ORAL | 1 refills | Status: DC

## 2021-11-18 NOTE — Telephone Encounter (Signed)
Refill has been sent to the pharmacy per pts request.

## 2021-11-18 NOTE — Telephone Encounter (Signed)
Received notification from [facility] rep:  Patient's [name] is going through her medical benefit through [employer] with a $0 ded and a $4,000 oop. The plan covers at 80%. Once patient meets her $4,000 oop she is 100% covered for the year. As of 6/20 she met $2,728 which is why she received the bill for >$1000.  The pt consented and agreed to payment.  There aren't any foundations currently open at this time but I'll have one of my reps reach out to her to get her signed up for FAP.  Knox Saliva, PharmD, MPH, BCPS Clinical Pharmacist (Rheumatology and Pulmonology)

## 2021-11-21 ENCOUNTER — Telehealth: Payer: Self-pay | Admitting: Pulmonary Disease

## 2021-11-21 DIAGNOSIS — I2723 Pulmonary hypertension due to lung diseases and hypoxia: Secondary | ICD-10-CM | POA: Diagnosis not present

## 2021-11-21 DIAGNOSIS — J8410 Pulmonary fibrosis, unspecified: Secondary | ICD-10-CM | POA: Diagnosis not present

## 2021-11-21 NOTE — Telephone Encounter (Signed)
Called patient but she did not answer. Left message for her to call back.

## 2021-11-21 NOTE — Telephone Encounter (Signed)
Pt states she has a cough. Pt is on [name]- supposed to help with cough, it is not. Pt states her o2 level is dropping low with minimal exertion(on oxygen 24/7), pt states it has dropped to-76% or lowest to 65%.Pt has not tested for covid-no fever. Scheduled with MH as she is needing a f/u. Please advise [phone]

## 2021-11-21 NOTE — Telephone Encounter (Signed)
Called and spoke with patient. She stated that has been coughing more lately. She was under the impression that the [name] was supposed to help her cough. It has actually made her cough worse. She is not coughing up any phlegm. She still using her O2: 2-3L at rest, 4L with exertion. She is also using her Symbicort 160 inhaler.   She has been scheduled for a follow up tomorrow at 130pm but she wanted to know if she needed to do anything before her appt tomorrow.   Pharmacy is [facility] in [place].   MH, can you please advise? Thanks!

## 2021-11-21 NOTE — Telephone Encounter (Signed)
It is quite possible the cough is actually caused by [name].  How is her shortness of breath?  Any better, any worse?  How are her oxygen levels?  Any better, any worse?  If any worsening shortness of breath or lower oxygen levels, would be reasonable to test for flu and COVID even if lacking fever or other symptoms.  We can always repeat a chest x-ray at her upcoming visit.

## 2021-11-22 ENCOUNTER — Other Ambulatory Visit: Payer: Self-pay | Admitting: Pulmonary Disease

## 2021-11-22 ENCOUNTER — Encounter: Payer: Self-pay | Admitting: Pulmonary Disease

## 2021-11-22 ENCOUNTER — Other Ambulatory Visit: Payer: Self-pay

## 2021-11-22 ENCOUNTER — Ambulatory Visit (INDEPENDENT_AMBULATORY_CARE_PROVIDER_SITE_OTHER): Payer: Medicare PPO

## 2021-11-22 ENCOUNTER — Ambulatory Visit (INDEPENDENT_AMBULATORY_CARE_PROVIDER_SITE_OTHER): Payer: Medicare PPO | Admitting: Pulmonary Disease

## 2021-11-22 VITALS — BP 132/74 | HR 92 | Temp 98.0°F | Ht 65.0 in | Wt 198.4 lb

## 2021-11-22 DIAGNOSIS — I272 Pulmonary hypertension, unspecified: Secondary | ICD-10-CM

## 2021-11-22 DIAGNOSIS — J849 Interstitial pulmonary disease, unspecified: Secondary | ICD-10-CM | POA: Diagnosis not present

## 2021-11-22 DIAGNOSIS — R0609 Other forms of dyspnea: Secondary | ICD-10-CM | POA: Diagnosis not present

## 2021-11-22 LAB — BASIC METABOLIC PANEL
BUN: 17 mg/dL (ref 6–23)
CO2: 26 mEq/L (ref 19–32)
Calcium: 9.4 mg/dL (ref 8.4–10.5)
Chloride: 102 mEq/L (ref 96–112)
Creatinine, Ser: 0.93 mg/dL (ref 0.40–1.20)
GFR: 62.82 mL/min (ref >60.00)
Glucose, Bld: 102 mg/dL — ABNORMAL HIGH (ref 70–99)
Potassium: 4.2 mEq/L (ref 3.5–5.1)
Sodium: 136 mEq/L (ref 135–145)

## 2021-11-22 LAB — BRAIN NATRIURETIC PEPTIDE: Pro B Natriuretic peptide (BNP): 87 pg/mL (ref 0.0–100.0)

## 2021-11-22 MED ORDER — FUROSEMIDE 20 MG PO TABS: 20.0000 mg | ORAL_TABLET | Freq: Two times a day (BID) | ORAL | 1 refills | Status: DC

## 2021-11-22 NOTE — Progress Notes (Signed)
_0  ID: Kristie [name], female    DOB: 02/03/1951, 70 y.o.   MRN: [id number]  Chief Complaint  Patient presents with   Follow-up    Pt states breathing is bad. Is on 3L of O2 when ambulating     Referring provider: Lowella Dandy, NP  HPI:   70 y.o. whom we are seeing for evaluation of pulmonary hypertension, group 3 disease, currently on [name] started 06/2021.    Client not feeling well.  About a month worsening dyspnea.  Oxygen saturation decreasing at home with exertion.  However, today oxygen saturation 95% after walking into the room.  Reviewed serial weights.  Weight is up quite a bit since discharge from [facility] earlier 2022.  Even when accounting for differences in close, suspect 20+ pounds above dry weight at that time.  Her appetite has improved and probably is put on some weight from increased calories.  However do worry with her worsening symptoms and weight gain some of this represents water weight.  Possible after her COVID infection in the fall 2022 she developed additional scarring in addition to her connective tissue disease related ILD.  Discussed this at length with patient.  Has ongoing cough.  At goal dose [name], 12 puffs 4 times daily.  Discussed likely cough is related to [name].  She reports good adherence to Symbicort.  Interestingly, albuterol does improve severity of cough.   HPI at initial visit: Patient has ongoing dyspnea on exertion.  Worsening over time.  Now minimal activity such as walking on flat surfaces make things worse.  Worse with inclines or stairs.  No timing during the day were things are better or worse.  No seasonal or environmental changes that she can identify with things are better or worse.  No alleviating or exacerbating factors.  Review of records indicates 07/2019 CT high-res will review shows signs consistent with UIP on my evaluation.  Repeat high-res CT 01/2020 with stable findings my interpretation.  Most recent high-res CT 2/22 2 with mild  progression of peripheral fibrosis without frank honeycombing on my interpretation.  TTE 01/2020 reviewed with normal RV function, RA size, mildly elevated PASP with mild mitral valve and aortic regurgitation.  Most recent TTE 02/5360 with diastolic dysfunction, mitral valve regurgitation, mild to moderate aortic valve regurgitation severely dilated RA, elevated right ultra pressure of 8, elevated PASP, enlarged RV with normal function all suggestive worsening pressure overload.  PMH: ILD, Sjogren's, connective tissue disease Surgical history: Spine surgery, gastric band and gastric sleeve resection Family history: Mother and father with CAD, sister with diabetes and CAD, brother with CAD Social history: She is a never smoker, lives with husband in [place] / Pulmonary Flowsheets:   ACT:  No flowsheet data found.  MMRC: mMRC Dyspnea Scale mMRC Score  11/22/2020 3  02/06/2020 2    Epworth:  No flowsheet data found.  Tests:   FENO:  No results found for: NITRICOXIDE  PFT: PFT Results Latest Ref Rng & Units 08/03/2020 03/09/2020  FVC-Pre L 1.59 1.75  FVC-Predicted Pre % 50 54  FVC-Post L 1.74 1.74  FVC-Predicted Post % 54 54  Pre FEV1/FVC % % 95 76  Post FEV1/FCV % % 93 87  FEV1-Pre L 1.52 1.32  FEV1-Predicted Pre % 62 53  FEV1-Post L 1.61 1.51  DLCO uncorrected ml/min/mmHg 9.52 8.44  DLCO UNC% % 47 41  DLCO corrected ml/min/mmHg 9.52 9.01  DLCO COR %Predicted % 47 44  DLVA Predicted % 89  86  TLC L 2.75 2.75  TLC % Predicted % 53 52  RV % Predicted % 31 45  Personally reviewed and interpreted as spirometry suggestive of severe restriction, no significant bronchodilator response, TLC with moderate restriction 53% of predicted, DLCO severely reduced.  Combination of findings suggestive of parenchymal restrictive process.  WALK:  SIX MIN WALK 02/06/2020 09/18/2019 07/02/2019 05/29/2019  Medications albuterol neb sol 0.083%, atorvastatin 81m, famotidine 234m  levothyroxine 12586m metoprolol 26m5montelukast 10mg8mrphine 15mg,90mran 4mg, p39moprazole 40mg at21m5am - - -  Supplimental Oxygen during Test? (L/min) No No - No  Laps 11 - - -  Partial Lap (in Meters) 0 - - -  Baseline BP (sitting) 130/78 - - -  Baseline Heartrate 84 - - -  Baseline Dyspnea (Borg Scale) 4 - - -  Baseline Fatigue (Borg Scale) 3 - - -  Baseline SPO2 99 - - -  BP (sitting) 166/88 - - -  Heartrate 116 - - -  Dyspnea (Borg Scale) 8 - - -  Fatigue (Borg Scale) 5 - - -  SPO2 94 - - -  BP (sitting) 160/82 - - -  Heartrate 86 - - -  SPO2 97 - - -  Stopped or Paused before Six Minutes No - - -  Interpretation Hip pain;Calf pain - - -  Distance Completed 374 - - -  Tech Comments: Pt walked at an average pace completing the entire 6 min without stopping. Pt did stagger some during the walk and also coughed throughout the walk. pt walked a very slow pace for approx 20 steps and stopped 3 x due to SOB and back pain//lmr average pace/SOB//lmr average pace/SOB//lmr    Imaging: Personally reviewed and as per EMR  Lab Results: Personally reviewed, notably eosinophils as high as 700 12/2020 CBC    Component Value Date/Time   WBC 13.0 (H) 01/05/2021 1212   RBC 3.59 (L) 01/05/2021 1212   HGB 10.9 (L) 01/05/2021 1212   HGB 13.2 03/26/2020 1425   HCT 33.5 (L) 01/05/2021 1212   PLT 453.0 (H) 01/05/2021 1212   PLT 431 (H) 03/26/2020 1425   MCV 93.6 01/05/2021 1212   MCH 29.1 03/26/2020 1425   MCHC 32.5 01/05/2021 1212   RDW 15.4 01/05/2021 1212   LYMPHSABS 1.9 01/05/2021 1212   MONOABS 1.1 (H) 01/05/2021 1212   EOSABS 0.7 01/05/2021 1212   BASOSABS 0.1 01/05/2021 1212    BMET    Component Value Date/Time   NA 136 03/26/2020 1425   K 3.8 03/26/2020 1425   CL 106 03/26/2020 1425   CO2 21 (L) 03/26/2020 1425   GLUCOSE 105 (H) 03/26/2020 1425   BUN 13 03/26/2020 1425   CREATININE 0.86 03/26/2020 1425   CALCIUM 8.9 03/26/2020 1425   GFRNONAA >60 03/26/2020 1425    GFRAA >60 03/26/2020 1425    BNP No results found for: BNP  ProBNP    Component Value Date/Time   PROBNP 139.0 (H) 05/29/2019 1118    Specialty Problems       Pulmonary Problems   Allergic rhinitis    Qualifier: Diagnosis of  By: Ellison Loanne Drillingn A       Chronic cough    Onset 2005 intermittent and evolved to chronic cough Jan 2020 on fosfamax -  D/c fosfamax and symb 160 05/29/2019 and max rx for reflux > resolved 07/02/2019       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 Jan 2020  - 05/29/2019   Walked RA  2 laps @  approx 280f each @ fast pace  stopped due to end of study c/o sob and sats down to 92%   - collagen vasc profile sent 05/29/2019 >  Pos for ANA with speckled pattern 1: 1280 and RA 19  - 07/02/2019   Walked RA  2 laps @  approx 2564feach @ avg pace  stopped due to  End of study with sats 91% at sob at end   - HRCT 07/24/2019 1. The appearance of the lungs remains compatible with interstitial lung disease, with a spectrum of findings considered probable usual interstitial pneumonia (UIP) per current ATS guidelines. 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 - Rheum eval 07/16/19 by ErMarella ChimesA with multiple studies sent       DOE (dyspnea on exertion)    Onset around 2015 assoc with PF   09/18/2019   Walked RA x one lap =  approx 250 ft - stopped due to  Sob/back pain with sats 91% at end at very slow pace / freq stops      ILD (interstitial lung disease) ([facility]  Chronic respiratory failure with hypoxia (HCC)   Snoring    Allergies  Allergen Reactions   Penicillins Itching and Other (See Comments)    "Cillin" Family - Amoxicillin - itching    Did it involve swelling of the face/tongue/throat, SOB, or low BP? Yes Did it involve sudden or severe rash/hives, skin peeling, or any  reaction on the inside of your mouth or nose? no Did you need to seek medical attention at a hospital or doctor's office? yes When did it last happen?      30 years If all above answers are "NO", may proceed with cephalosporin use.   Doxycycline Rash    Immunization History  Administered Date(s) Administered   Fluad Quad(high Dose 65+) 11/10/2020   Influenza, High Dose Seasonal PF 08/29/2019   Moderna Sars-Covid-2 Vaccination 01/01/2020, 01/26/2020, 11/03/2020    Past Medical History:  Diagnosis Date   Anxiety    Bronchitis    Fibromyalgia    Hypertension    PMH: 2010   Hypothyroidism    Seasonal allergies    Spinal stenosis    L-5 to S-1   Thyroid disease    Wears glasses     Tobacco History: Social History   Tobacco Use  Smoking Status Never  Smokeless Tobacco Never   Counseling given: Not Answered   Continue to not smoke  Outpatient Encounter Medications as of 11/22/2021  Medication Sig   albuterol (PROVENTIL) (2.5 MG/3ML) 0.083% nebulizer solution Take 3 mLs (2.5 mg total) by nebulization every 4 (four) hours as needed for wheezing or shortness of breath.   albuterol (VENTOLIN HFA) 108 (90 Base) MCG/ACT inhaler Inhale 2 puffs into the lungs every 4 (four) hours as needed for wheezing or shortness of breath.   amLODipine (NORVASC) 10 MG tablet Take 1 tablet by mouth daily.   amLODIPine-Valsartan-HCTZ 10-160-12.5 MG TABS    Ascorbic Acid (VITAMIN C) 100 MG tablet Take 100 mg by mouth daily.   aspirin EC 81 MG tablet Take 81 mg by mouth daily.   atorvastatin (LIPITOR) 80 MG tablet Take 80 mg by mouth daily.    azaTHIOprine (IMURAN) 50 MG tablet Take 3 tablets (150 mg total) by mouth daily.   budesonide-formoterol (  SYMBICORT) 160-4.5 MCG/ACT inhaler Inhale 2 puffs into the lungs 2 (two) times daily.   Calcium 250 MG CAPS Take 500 mg by mouth 2 (two) times daily.   citalopram (CELEXA) 20 MG tablet Take 20 mg by mouth daily.   empagliflozin (JARDIANCE) 10 MG TABS  tablet Take 1 tablet by mouth daily.   furosemide (LASIX) 20 MG tablet Take 1 tablet (20 mg total) by mouth 2 (two) times daily.   gabapentin (NEURONTIN) 100 MG capsule    hydrOXYzine (ATARAX/VISTARIL) 25 MG tablet Take 25 mg by mouth at bedtime.   levothyroxine (SYNTHROID) 100 MCG tablet Take 100 mcg by mouth daily before breakfast.   lisinopril (ZESTRIL) 5 MG tablet Take by mouth.   loratadine-pseudoephedrine (CLARITIN-D 24-HOUR) 10-240 MG 24 hr tablet Take 1 tablet by mouth daily.   Magnesium 250 MG TABS Take 1 tablet by mouth at bedtime.   montelukast (SINGULAIR) 10 MG tablet Take 10 mg by mouth daily.    Multiple Vitamin (MULTIVITAMIN WITH MINERALS) TABS tablet Take 1 tablet by mouth daily.   nitroGLYCERIN (NITROSTAT) 0.4 MG SL tablet Place 0.4 mg under the tongue every 5 (five) minutes as needed for chest pain. AS NEEDED   ondansetron (ZOFRAN ODT) 4 MG disintegrating tablet Take 1 tablet (4 mg total) by mouth every 8 (eight) hours as needed for nausea or vomiting.   pantoprazole (PROTONIX) 40 MG tablet TAKE 1 TABLET DAILY 30 TO 60 MINUTES BEFORE FIRST MEAL OF THE DAY   pimecrolimus (ELIDEL) 1 % cream Apply 1 application topically 2 (two) times daily.   ranolazine (RANEXA) 500 MG 12 hr tablet    sulfamethoxazole-trimethoprim (BACTRIM DS) 800-160 MG tablet TAKE 1 TABLET BY MOUTH 3 TIMES A WEEK   tiZANidine (ZANAFLEX) 4 MG capsule Take 1 capsule (4 mg total) by mouth 3 (three) times daily. (Patient taking differently: Take 4 mg by mouth 3 (three) times daily as needed.)   No facility-administered encounter medications on file as of 11/22/2021.     Review of Systems  Review of Systems  N/A  Physical Exam  BP 132/74 (BP Location: Left Arm, Patient Position: Sitting, Cuff Size: Normal)    Pulse 92    Temp 98 F (36.7 C) (Oral)    Ht _0  (1.651 m)    Wt 198 lb 6.4 oz (90 kg)    SpO2 95%    BMI 33.02 kg/m   Wt Readings from Last 5 Encounters:  11/22/21 198 lb 6.4 oz (90 kg)   09/06/21 195 lb 9.6 oz (88.7 kg)  08/03/21 188 lb 6.4 oz (85.5 kg)  05/23/21 174 lb (78.9 kg)  04/17/21 164 lb (74.4 kg)    BMI Readings from Last 5 Encounters:  11/22/21 33.02 kg/m  09/06/21 32.55 kg/m  08/03/21 31.35 kg/m  05/23/21 28.96 kg/m  04/17/21 27.29 kg/m     Physical Exam General: Well-appearing, no acute distress Eyes: EOMI, no icterus Neck: Supple, no JVP Pulmonary: Very fine crackles in bilateral bases, otherwise clear, normal work of breathing on oxygen Cardiovascular: Tachycardic, regular rhythm, no murmurs Abdomen: Nondistended, bowel sounds present MSK: No synovitis, joint effusion Neuro: No weakness, sensation intact Psych: Normal mood, full affect   Assessment & Plan:   Pulmonary hypertension: Suspect contributions largely from group 3 disease given hypoxemia, interstitial lung disease.  No OSA on polysomnography and no evidence of chronic PE on VQ scan.  Given significant group 3 disease and parenchymal disease, prescribed [name] 05/2021 in effort to minimize VQ mismatch.  Dry weight approximately 172 pounds.  Difficult to assess given she is starting to gain weight after significant diarrhea and malnutrition.  She has worsening dyspnea and hypoxemia since diagnosed with COVID early 07/2021.  Chest x-ray concerning for development of post viral scarring.  I had some concern for unmasking of PV OD.  However, she reports improvement on [name] and not worsening.  After COVID infection again had another improvement with up titration of [name] further reducing concern for PV OD.  She became less hypoxemic, dyspnea improved.  However, over the last few weeks worsening dyspnea.  Weight is up.  Mild lower extremity swelling on exam.   -BNP, BMP today -Increase Lasix 20 mg twice daily from daily -Repeat labs next week to monitor electrolytes, kidney function  Chronic hypoxemic respiratory failure: To continue 4 L with exertion, 2 to 3 L with rest.  Stable hypoxemia  in clinic today although she reports worsening at home.  Chronic hypoxemia related to parenchymal changes.  No active ILD per serial CT scans and consultation with Dr. Vaughan Browner, prior pulmonologist.  Suspect if truly worsening it is in the setting of concern for increased fluid retention.  Increase diuresis as above. --Chest x-ray today --plan repeat CT 01/2022  Cough: Suspect related to up titration of [name].  However, albuterol does seem to improve this.  On Symbicort.  Consider escalation to triple inhaled therapy in the future versus down titration of [name] to see if cough improves.   Return in about 2 months (around 01/23/2022).   Lanier Clam, MD 11/22/2021   This appointment required 45 minutes of patient care (this includes precharting, chart review, review of results, face-to-face care, etc.).

## 2021-11-22 NOTE — Patient Instructions (Addendum)
Nice to see you again  I worry symptoms are related to fluid building back up causing her shortness of breath and oxygen a drop  Take Lasix 20 mg twice a day (at least 6 hours apart) through Monday.  We will repeat labs on Monday.  Based on the results of the lab, I will instruct you further on Lasix dosing  We will get a chest x-ray today  Return to clinic in 2 months or sooner as needed with Dr. Silas Flood

## 2021-11-23 ENCOUNTER — Other Ambulatory Visit: Payer: Self-pay | Admitting: Pulmonary Disease

## 2021-11-23 DIAGNOSIS — J849 Interstitial pulmonary disease, unspecified: Secondary | ICD-10-CM

## 2021-11-23 NOTE — Progress Notes (Signed)
Reviewed results of labs, BMP largely unchanged.  BNP within normal limits.  This is surprising given weight gain, worsening dyspnea.  Initially was concern for water weight instructed to use Lasix 20 mg twice daily from daily.  Called patient and discussed results of labs and reassuring results.  As such, apologize for confusion, but asked her to resume Lasix 20 mg daily as increasing diuretics unlikely to help based on lab results and exam.  Furthermore, discussed chest x-ray which on my review and interpretation appears similar to 08/2021 but worse from earlier in the year.  This is likely represents postinflammatory fibrosis following COVID infection in the fall.  Given parenchymal changes on chest x-ray, this is most likely explanation for her worsening dyspnea and occasional hypoxemia.  She expressed understanding.  We will obtain CT scan for better characterization.  Order placed today. [facility] preferred location as closer to home.

## 2021-11-28 DIAGNOSIS — J9611 Chronic respiratory failure with hypoxia: Secondary | ICD-10-CM | POA: Diagnosis not present

## 2021-11-28 DIAGNOSIS — Z9981 Dependence on supplemental oxygen: Secondary | ICD-10-CM | POA: Diagnosis not present

## 2021-11-28 DIAGNOSIS — J209 Acute bronchitis, unspecified: Secondary | ICD-10-CM | POA: Diagnosis not present

## 2021-11-29 NOTE — Telephone Encounter (Signed)
Pt seen by Dr. Silas Flood on 12/13. Will close encounter.

## 2021-11-30 DIAGNOSIS — J45909 Unspecified asthma, uncomplicated: Secondary | ICD-10-CM | POA: Diagnosis not present

## 2021-11-30 DIAGNOSIS — J9601 Acute respiratory failure with hypoxia: Secondary | ICD-10-CM | POA: Diagnosis not present

## 2021-12-11 DIAGNOSIS — J8410 Pulmonary fibrosis, unspecified: Secondary | ICD-10-CM | POA: Diagnosis not present

## 2021-12-11 DIAGNOSIS — I2723 Pulmonary hypertension due to lung diseases and hypoxia: Secondary | ICD-10-CM | POA: Diagnosis not present

## 2021-12-13 ENCOUNTER — Telehealth: Payer: Self-pay | Admitting: Pulmonary Disease

## 2021-12-13 NOTE — Telephone Encounter (Signed)
Called patient and someone answered the phone but did not say anything before hanging up.   Will attempt to call back later today.

## 2021-12-13 NOTE — Telephone Encounter (Signed)
Pt calling because she has had a URI since before Christmas- went to PCP, was given prednisone taper and abx. Pt states she is still not feeling well and was told to call pulmonary if she wasn't feeling better. Thinking she might need a stronger abx. Please advise [phone] or [phone]

## 2021-12-15 ENCOUNTER — Telehealth: Payer: Self-pay | Admitting: Pulmonary Disease

## 2021-12-15 DIAGNOSIS — I272 Pulmonary hypertension, unspecified: Secondary | ICD-10-CM | POA: Diagnosis not present

## 2021-12-15 DIAGNOSIS — E782 Mixed hyperlipidemia: Secondary | ICD-10-CM | POA: Diagnosis not present

## 2021-12-15 DIAGNOSIS — I2510 Atherosclerotic heart disease of native coronary artery without angina pectoris: Secondary | ICD-10-CM | POA: Diagnosis not present

## 2021-12-15 DIAGNOSIS — M3502 Sicca syndrome with lung involvement: Secondary | ICD-10-CM | POA: Diagnosis not present

## 2021-12-15 DIAGNOSIS — Z955 Presence of coronary angioplasty implant and graft: Secondary | ICD-10-CM | POA: Diagnosis not present

## 2021-12-16 ENCOUNTER — Other Ambulatory Visit: Payer: Self-pay | Admitting: Pulmonary Disease

## 2021-12-16 DIAGNOSIS — I10 Essential (primary) hypertension: Secondary | ICD-10-CM | POA: Diagnosis not present

## 2021-12-16 DIAGNOSIS — I2510 Atherosclerotic heart disease of native coronary artery without angina pectoris: Secondary | ICD-10-CM | POA: Diagnosis not present

## 2021-12-16 DIAGNOSIS — E039 Hypothyroidism, unspecified: Secondary | ICD-10-CM | POA: Diagnosis not present

## 2021-12-16 DIAGNOSIS — J849 Interstitial pulmonary disease, unspecified: Secondary | ICD-10-CM | POA: Diagnosis not present

## 2021-12-16 DIAGNOSIS — I50810 Right heart failure, unspecified: Secondary | ICD-10-CM | POA: Diagnosis not present

## 2021-12-16 DIAGNOSIS — F330 Major depressive disorder, recurrent, mild: Secondary | ICD-10-CM | POA: Diagnosis not present

## 2021-12-16 DIAGNOSIS — Z9981 Dependence on supplemental oxygen: Secondary | ICD-10-CM | POA: Diagnosis not present

## 2021-12-16 DIAGNOSIS — J9611 Chronic respiratory failure with hypoxia: Secondary | ICD-10-CM | POA: Diagnosis not present

## 2021-12-16 DIAGNOSIS — I2721 Secondary pulmonary arterial hypertension: Secondary | ICD-10-CM | POA: Diagnosis not present

## 2021-12-19 NOTE — Telephone Encounter (Signed)
Provider discontinued medication

## 2021-12-19 NOTE — Telephone Encounter (Signed)
Patient enrolled into [place] today conditionally through 01/18/22. Patient aware that letter mailed to her home in 5-7 business days will detail next steps to take for full enrollment  Knox Saliva, PharmD, MPH, BCPS Clinical Pharmacist (Rheumatology and Pulmonology)

## 2021-12-19 NOTE — Telephone Encounter (Signed)
She had a sharp decline following covid infection in the fall. Suspect post covid fibrotic changes. Once CT performed will review and get her in for follow up with you. Thanks!

## 2021-12-19 NOTE — Telephone Encounter (Signed)
[employer] for pulmonary fibrosis is open for enrollment. Called patient to enroll.  Application was conditionally approved from 12/19/21 through 01/18/22.  Patient advised on phone that she will receive letter in 5-7 business days addressing next steps for continued coverage. Advised her to complete ASAP and reach out to grant foundation if she has specific questions. Review that grant funds are active until expiration date of grant or if she depletes funds (whichever is sooner). Reviewed that we cannot predict when grants will re-open but will always have pt assistance as option  ID: [id number] Group Number: [id number]  Pharmacy Claims PCN: [id number] BIN: [id number] Processing: 08 Phone: [phone] Fax: [phone]  Medical Claims Payor ID: [id number] Phone: [phone] Fax: [phone]  Mail:  [address] [address] [place] [address]  TAF grant phone number: [phone]  Called and provided information to [facility] - rep notated that patient is receiving through medical benefit.  Knox Saliva, PharmD, MPH, BCPS Clinical Pharmacist (Rheumatology and Pulmonology)

## 2021-12-21 DIAGNOSIS — I2723 Pulmonary hypertension due to lung diseases and hypoxia: Secondary | ICD-10-CM | POA: Diagnosis not present

## 2021-12-23 ENCOUNTER — Telehealth: Payer: Self-pay | Admitting: Pulmonary Disease

## 2021-12-23 ENCOUNTER — Other Ambulatory Visit: Payer: Self-pay | Admitting: Pulmonary Disease

## 2021-12-23 DIAGNOSIS — J849 Interstitial pulmonary disease, unspecified: Secondary | ICD-10-CM

## 2021-12-23 MED ORDER — SULFAMETHOXAZOLE-TRIMETHOPRIM 800-160 MG PO TABS: ORAL_TABLET | ORAL | 5 refills | Status: DC

## 2021-12-23 NOTE — Telephone Encounter (Signed)
Rx has been sent to preferred pharmacy for pt. Called and spoke with pt letting her know this had been done and she verbalized understanding. Nothing further needed.

## 2021-12-24 ENCOUNTER — Other Ambulatory Visit: Payer: Self-pay | Admitting: Pulmonary Disease

## 2021-12-24 DIAGNOSIS — J849 Interstitial pulmonary disease, unspecified: Secondary | ICD-10-CM

## 2021-12-27 NOTE — Telephone Encounter (Signed)
Ok to continue. Can you look into why her CT has not been scheduled. Discussed with Dr. Vaughan Browner and based on results may need to see him for additional evaluation.

## 2021-12-27 NOTE — Telephone Encounter (Signed)
Actually, looks like she should be on 150 mg (3 tabs) and has refills from Dr. Vaughan Browner from 10/10/21. Do not think this refill is needed.

## 2021-12-31 DIAGNOSIS — J9601 Acute respiratory failure with hypoxia: Secondary | ICD-10-CM | POA: Diagnosis not present

## 2021-12-31 DIAGNOSIS — J45909 Unspecified asthma, uncomplicated: Secondary | ICD-10-CM | POA: Diagnosis not present

## 2022-01-02 ENCOUNTER — Telehealth: Payer: Self-pay | Admitting: Pulmonary Disease

## 2022-01-03 NOTE — Telephone Encounter (Signed)
Called patient and left voicemail for her to call office back

## 2022-01-04 NOTE — Telephone Encounter (Signed)
Called pt and informed her that I have the paperwork and will have [place] sign when back in office 01/12/22. Nothing further

## 2022-01-16 DIAGNOSIS — I2510 Atherosclerotic heart disease of native coronary artery without angina pectoris: Secondary | ICD-10-CM | POA: Diagnosis not present

## 2022-01-16 DIAGNOSIS — J849 Interstitial pulmonary disease, unspecified: Secondary | ICD-10-CM | POA: Diagnosis not present

## 2022-01-16 DIAGNOSIS — I700 Atherosclerosis of aorta: Secondary | ICD-10-CM | POA: Diagnosis not present

## 2022-01-16 DIAGNOSIS — J479 Bronchiectasis, uncomplicated: Secondary | ICD-10-CM | POA: Diagnosis not present

## 2022-01-19 DIAGNOSIS — I2723 Pulmonary hypertension due to lung diseases and hypoxia: Secondary | ICD-10-CM | POA: Diagnosis not present

## 2022-01-19 DIAGNOSIS — J849 Interstitial pulmonary disease, unspecified: Secondary | ICD-10-CM | POA: Diagnosis not present

## 2022-01-19 DIAGNOSIS — J8410 Pulmonary fibrosis, unspecified: Secondary | ICD-10-CM | POA: Diagnosis not present

## 2022-01-22 ENCOUNTER — Other Ambulatory Visit: Payer: Self-pay | Admitting: Pulmonary Disease

## 2022-01-23 ENCOUNTER — Encounter: Payer: Self-pay | Admitting: Pulmonary Disease

## 2022-01-23 ENCOUNTER — Ambulatory Visit: Payer: Medicare PPO | Admitting: Pulmonary Disease

## 2022-01-23 ENCOUNTER — Telehealth: Payer: Self-pay | Admitting: Pulmonary Disease

## 2022-01-23 ENCOUNTER — Other Ambulatory Visit: Payer: Self-pay

## 2022-01-23 VITALS — BP 126/72 | HR 102 | Temp 98.3°F | Ht 65.0 in | Wt 192.0 lb

## 2022-01-23 DIAGNOSIS — J9611 Chronic respiratory failure with hypoxia: Secondary | ICD-10-CM

## 2022-01-23 DIAGNOSIS — I272 Pulmonary hypertension, unspecified: Secondary | ICD-10-CM

## 2022-01-23 DIAGNOSIS — J849 Interstitial pulmonary disease, unspecified: Secondary | ICD-10-CM | POA: Diagnosis not present

## 2022-01-23 NOTE — Telephone Encounter (Signed)
Spoke with the pt  She states that she is taking imuran 100 mg 2 tablets daily  She was told to call with this info  She asks if this needs to be adjusted Please advise, thanks!

## 2022-01-23 NOTE — Telephone Encounter (Signed)
Dr. Silas Flood, please advise on refill request.

## 2022-01-23 NOTE — Telephone Encounter (Signed)
Script for lasix 20 mg approved, BID dosing was inaccurate.

## 2022-01-23 NOTE — Patient Instructions (Addendum)
Nice to see you  We will walk and if needed will order another oxygen concentrator at home  Will prescribe Esbriet - paperwork today. This is to slow scarring.   Labs today to prepare for th new medicine  Call me and tell me what dose of azathioprine (Imuran) you are taking  Follow up in 4 weeks with Dr Silas Flood

## 2022-01-24 LAB — HEPATIC FUNCTION PANEL
ALT: 12 U/L (ref 0–35)
AST: 24 U/L (ref 0–37)
Albumin: 4.4 g/dL (ref 3.5–5.2)
Alkaline Phosphatase: 76 U/L (ref 39–117)
Bilirubin, Direct: 0 mg/dL (ref 0.0–0.3)
Total Bilirubin: 0.3 mg/dL (ref 0.2–1.2)
Total Protein: 7.6 g/dL (ref 6.0–8.3)

## 2022-01-24 NOTE — Progress Notes (Signed)
_0  ID: Kristie [name], female    DOB: 05/18/1951, 71 y.o.   MRN: [id number]  Chief Complaint  Patient presents with   Follow-up    Follow up. Pt states her O2 drops when she gets up, she states it gets in the low 60s. Pt states she is on 4-5L when she gets up.     Referring provider: Lowella Dandy, NP  HPI:   71 y.o. whom we are seeing for evaluation of pulmonary hypertension, group 3 disease, currently on [name] started 06/2021.    Overall, gradual decline following COVID infection 08/2021.  Had initial improvement with symptoms while started [name] 06/2021.  Contracted COVID 08/2021 and had worsening hypoxemia.  Chest x-ray demonstrated bilateral infiltrates worse compared to prior.  These have persisted on subsequent chest x-rays.  Her oxygen need has increased.  Desaturated at home when she is not turning up her stationary oxygen went up and exerting herself.  Discussed at length just turning the oxygen concentrator has to go, 5 L currently.  She was walked in the office today and maintain saturations at 88% on 4 L via her her POC.  Advise she does keep the oxygen at 5 L to help protect her when she moves around as she is not routinely increasing her oxygen to concentrator at home when she exerts herself.  We reviewed in depth her recent CT high-res performed at [facility].  Results are viewable in the portal, not via epic.  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  There is some confusion on her current dose of Imuran.  We have documented 150 mg daily, they think she is taking 100 mg daily.  We had a lengthy and frank conversation regarding goals of care.  Given her decline, suspect she will continue to worsen.  She now is requiring 4 to 5 L with exertion and likely will increase in the future.  This is despite aggressive immunosuppressants.   Given the timing of her symptoms fear this reflects a component of post COVID fibrotic changes although certainly at risk for progression of her underlying ILD as well.  I recommended that if she were to get sicker any more oxygen that is okay but I counseled her that she should not go on the ventilator, I recommended a CODE STATUS of DNR if she were to be admitted to the hospital.  She expressed understanding.  She is is to discuss with her husband moving forward with her wishes would be.  In addition, I introduced the topic of hospice.  I think she qualifies for hospice now although it does not seem she is ready for the services, wishes for ongoing aggressive treatment of her underlying pulmonary issues.  I said at some point we may reach a tipping point where there is not much more we can do to help with her lung disease.  And that she may reach this point sooner than her doctors at some point if things are doing to her or causing more harm than good or more suffering or prolonging her discomfort that she should tell us we should consider hospice services at that time.  Again, encouraged her and her husband to discuss her wishes and goals in the coming days and weeks so that we can best serve her.  HPI at initial visit: Patient has ongoing dyspnea on exertion.  Worsening over time.  Now minimal activity such as walking on flat surfaces make things worse.  Worse with inclines or stairs.  No timing during the day were things are better or worse.  No seasonal or environmental changes that she can identify with things are better or worse.  No alleviating or exacerbating factors.  Review of records indicates 07/2019 CT high-res will review shows signs consistent with UIP on my evaluation.  Repeat high-res CT 01/2020 with stable findings my interpretation.  Most recent high-res CT 2/22 2 with mild progression of peripheral fibrosis without frank honeycombing on my interpretation.  TTE 01/2020 reviewed with normal  RV function, RA size, mildly elevated PASP with mild mitral valve and aortic regurgitation.  Most recent TTE 03/9448 with diastolic dysfunction, mitral valve regurgitation, mild to moderate aortic valve regurgitation severely dilated RA, elevated right ultra pressure of 8, elevated PASP, enlarged RV with normal function all suggestive worsening pressure overload.  PMH: ILD, Sjogren's, connective tissue disease Surgical history: Spine surgery, gastric band and gastric sleeve resection Family history: Mother and father with CAD, sister with diabetes and CAD, brother with CAD Social history: She is a never smoker, lives with husband in [place] / Pulmonary Flowsheets:   ACT:  No flowsheet data found.  MMRC: mMRC Dyspnea Scale mMRC Score  11/22/2020 3  02/06/2020 2    Epworth:  No flowsheet data found.  Tests:   FENO:  No results found for: NITRICOXIDE  PFT: PFT Results Latest Ref Rng & Units 08/03/2020 03/09/2020  FVC-Pre L 1.59 1.75  FVC-Predicted Pre % 50 54  FVC-Post L 1.74 1.74  FVC-Predicted Post % 54 54  Pre FEV1/FVC % % 95 76  Post FEV1/FCV % % 93 87  FEV1-Pre L 1.52 1.32  FEV1-Predicted Pre % 62 53  FEV1-Post L 1.61 1.51  DLCO uncorrected ml/min/mmHg 9.52 8.44  DLCO UNC% % 47 41  DLCO corrected ml/min/mmHg 9.52 9.01  DLCO COR %Predicted % 47 44  DLVA Predicted % 89 86  TLC L 2.75 2.75  TLC % Predicted % 53 52  RV % Predicted % 31 45  Personally reviewed and interpreted as spirometry suggestive of severe restriction, no significant bronchodilator response, TLC with moderate restriction 53% of predicted, DLCO severely reduced.  Combination of findings suggestive of parenchymal restrictive process.  WALK:  SIX MIN WALK 02/06/2020 09/18/2019 07/02/2019 05/29/2019  Medications albuterol neb sol 0.083%, atorvastatin 26m, famotidine 235m levothyroxine 12546m metoprolol 85m61montelukast 10mg55mrphine 15mg,31mran 4mg, p70moprazole 40mg at53m5am - - -   Supplimental Oxygen during Test? (L/min) No No - No  Laps 11 - - -  Partial Lap (in Meters) 0 - - -  Baseline BP (sitting) 130/78 - - -  Baseline Heartrate 84 - - -  Baseline Dyspnea (Borg Scale) 4 - - -  Baseline Fatigue (Borg Scale) 3 - - -  Baseline SPO2 99 - - -  BP (sitting) 166/88 - - -  Heartrate 116 - - -  Dyspnea (Borg Scale) 8 - - -  Fatigue (Borg Scale) 5 - - -  SPO2 94 - - -  BP (sitting) 160/82 - - -  Heartrate 86 - - -  SPO2 97 - - -  Stopped or Paused before Six Minutes No - - -  Interpretation Hip pain;Calf pain - - -  Distance Completed 374 - - -  Tech Comments: Pt walked at an average pace completing the entire 6 min without stopping. Pt did stagger some during  the walk and also coughed throughout the walk. pt walked a very slow pace for approx 20 steps and stopped 3 x due to SOB and back pain//lmr average pace/SOB//lmr average pace/SOB//lmr    Imaging: Personally reviewed and as per EMR  Lab Results: Personally reviewed, notably eosinophils as high as 700 12/2020 CBC    Component Value Date/Time   WBC 13.0 (H) 01/05/2021 1212   RBC 3.59 (L) 01/05/2021 1212   HGB 10.9 (L) 01/05/2021 1212   HGB 13.2 03/26/2020 1425   HCT 33.5 (L) 01/05/2021 1212   PLT 453.0 (H) 01/05/2021 1212   PLT 431 (H) 03/26/2020 1425   MCV 93.6 01/05/2021 1212   MCH 29.1 03/26/2020 1425   MCHC 32.5 01/05/2021 1212   RDW 15.4 01/05/2021 1212   LYMPHSABS 1.9 01/05/2021 1212   MONOABS 1.1 (H) 01/05/2021 1212   EOSABS 0.7 01/05/2021 1212   BASOSABS 0.1 01/05/2021 1212    BMET    Component Value Date/Time   NA 136 11/22/2021 1428   K 4.2 11/22/2021 1428   CL 102 11/22/2021 1428   CO2 26 11/22/2021 1428   GLUCOSE 102 (H) 11/22/2021 1428   BUN 17 11/22/2021 1428   CREATININE 0.93 11/22/2021 1428   CREATININE 0.86 03/26/2020 1425   CALCIUM 9.4 11/22/2021 1428   GFRNONAA >60 03/26/2020 1425   GFRAA >60 03/26/2020 1425    BNP No results found for: BNP  ProBNP     Component Value Date/Time   PROBNP 87.0 11/22/2021 1428    Specialty Problems       Pulmonary Problems   Allergic rhinitis    Qualifier: Diagnosis of  By: Loanne Drilling MD, Sean A       Chronic cough    Onset 2005 intermittent and evolved to chronic cough Jan 2020 on fosfamax -  D/c fosfamax and symb 160 05/29/2019 and max rx for reflux > resolved 07/02/2019       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 Jan 2020  - 05/29/2019   Walked RA  2 laps @  approx 248f each @ fast pace  stopped due to end of study c/o sob and sats down to 92%   - collagen vasc profile sent 05/29/2019 >  Pos for ANA with speckled pattern 1: 1280 and RA 19  - 07/02/2019   Walked RA  2 laps @  approx 2556feach @ avg pace  stopped due to  End of study with sats 91% at sob at end   - HRCT 07/24/2019 1. The appearance of the lungs remains compatible with interstitial lung disease, with a spectrum of findings considered probable usual interstitial pneumonia (UIP) per current ATS guidelines. 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 - Rheum eval 07/16/19 by ErMarella ChimesA with multiple studies sent       DOE (dyspnea on exertion)    Onset around 2015 assoc with PF   09/18/2019   Walked RA x one lap =  approx 250 ft - stopped due to  Sob/back pain with sats 91% at end at very slow pace / freq stops      [place] lung disease) [place]  Chronic respiratory failure with hypoxia (HCC)   Snoring    Allergies  Allergen Reactions   Penicillins Itching and Other (See Comments)    "Cillin" Family - Amoxicillin -  itching    Did it involve swelling of the face/tongue/throat, SOB, or low BP? Yes Did it involve sudden or severe rash/hives, skin peeling, or any reaction on the inside of your mouth or nose? no Did you need to seek medical  attention at a hospital or doctor's office? yes When did it last happen?      30 years If all above answers are "NO", may proceed with cephalosporin use.   Doxycycline Rash    Immunization History  Administered Date(s) Administered   Fluad Quad(high Dose 65+) 11/10/2020   Influenza, High Dose Seasonal PF 08/29/2019   Moderna Sars-Covid-2 Vaccination 01/01/2020, 01/26/2020, 11/03/2020    Past Medical History:  Diagnosis Date   Anxiety    Bronchitis    Fibromyalgia    Hypertension    PMH: 2010   Hypothyroidism    Seasonal allergies    Spinal stenosis    L-5 to S-1   Thyroid disease    Wears glasses     Tobacco History: Social History   Tobacco Use  Smoking Status Never  Smokeless Tobacco Never   Counseling given: Not Answered   Continue to not smoke  Outpatient Encounter Medications as of 01/23/2022  Medication Sig   albuterol (PROVENTIL) (2.5 MG/3ML) 0.083% nebulizer solution Take 3 mLs (2.5 mg total) by nebulization every 4 (four) hours as needed for wheezing or shortness of breath.   albuterol (VENTOLIN HFA) 108 (90 Base) MCG/ACT inhaler Inhale 2 puffs into the lungs every 4 (four) hours as needed for wheezing or shortness of breath.   amLODipine (NORVASC) 10 MG tablet Take 1 tablet by mouth daily.   amLODIPine-Valsartan-HCTZ 10-160-12.5 MG TABS    Ascorbic Acid (VITAMIN C) 100 MG tablet Take 100 mg by mouth daily.   aspirin EC 81 MG tablet Take 81 mg by mouth daily.   atorvastatin (LIPITOR) 80 MG tablet Take 80 mg by mouth daily.    azaTHIOprine (IMURAN) 50 MG tablet Take 3 tablets (150 mg total) by mouth daily.   budesonide-formoterol (SYMBICORT) 160-4.5 MCG/ACT inhaler Inhale 2 puffs into the lungs 2 (two) times daily.   Calcium 250 MG CAPS Take 500 mg by mouth 2 (two) times daily.   citalopram (CELEXA) 20 MG tablet Take 20 mg by mouth daily.   empagliflozin (JARDIANCE) 10 MG TABS tablet Take 1 tablet by mouth daily.   gabapentin (NEURONTIN) 100 MG capsule     hydrOXYzine (ATARAX/VISTARIL) 25 MG tablet Take 25 mg by mouth at bedtime.   levothyroxine (SYNTHROID) 100 MCG tablet Take 100 mcg by mouth daily before breakfast.   lisinopril (ZESTRIL) 5 MG tablet Take by mouth.   loratadine-pseudoephedrine (CLARITIN-D 24-HOUR) 10-240 MG 24 hr tablet Take 1 tablet by mouth daily.   Magnesium 250 MG TABS Take 1 tablet by mouth at bedtime.   montelukast (SINGULAIR) 10 MG tablet Take 10 mg by mouth daily.    Multiple Vitamin (MULTIVITAMIN WITH MINERALS) TABS tablet Take 1 tablet by mouth daily.   ondansetron (ZOFRAN ODT) 4 MG disintegrating tablet Take 1 tablet (4 mg total) by mouth every 8 (eight) hours as needed for nausea or vomiting.   pantoprazole (PROTONIX) 40 MG tablet TAKE 1 TABLET DAILY 30 TO 60 MINUTES BEFORE FIRST MEAL OF THE DAY   pimecrolimus (ELIDEL) 1 % cream Apply 1 application topically 2 (two) times daily.   ranolazine (RANEXA) 500 MG 12 hr tablet    sulfamethoxazole-trimethoprim (BACTRIM DS) 800-160 MG tablet TAKE 1 TABLET BY MOUTH 3 TIMES A WEEK  tiZANidine (ZANAFLEX) 4 MG capsule Take 1 capsule (4 mg total) by mouth 3 (three) times daily. (Patient taking differently: Take 4 mg by mouth 3 (three) times daily as needed.)   [DISCONTINUED] furosemide (LASIX) 20 MG tablet TAKE 1 TABLET(20 MG) BY MOUTH TWICE DAILY   [DISCONTINUED] nitroGLYCERIN (NITROSTAT) 0.4 MG SL tablet Place 0.4 mg under the tongue every 5 (five) minutes as needed for chest pain. AS NEEDED   No facility-administered encounter medications on file as of 01/23/2022.     Review of Systems  Review of Systems  N/A  Physical Exam  BP 126/72 (BP Location: Left Arm, Patient Position: Sitting, Cuff Size: Normal)    Pulse (!) 102    Temp 98.3 F (36.8 C) (Oral)    Ht _0  (1.651 m)    Wt 192 lb (87.1 kg)    SpO2 92%    BMI 31.95 kg/m   Wt Readings from Last 5 Encounters:  01/23/22 192 lb (87.1 kg)  11/22/21 198 lb 6.4 oz (90 kg)  09/06/21 195 lb 9.6 oz (88.7 kg)  08/03/21  188 lb 6.4 oz (85.5 kg)  05/23/21 174 lb (78.9 kg)    BMI Readings from Last 5 Encounters:  01/23/22 31.95 kg/m  11/22/21 33.02 kg/m  09/06/21 32.55 kg/m  08/03/21 31.35 kg/m  05/23/21 28.96 kg/m     Physical Exam General: Well-appearing, no acute distress Eyes: EOMI, no icterus Neck: Supple, no JVP Pulmonary: Very fine crackles in bilateral bases, otherwise clear, normal work of breathing on oxygen Cardiovascular: Tachycardic, regular rhythm, no murmurs Abdomen: Nondistended, bowel sounds present MSK: No synovitis, joint effusion Neuro: No weakness, sensation intact Psych: Normal mood, full affect   Assessment & Plan:   Pulmonary hypertension: Suspect contributions largely from group 3 disease given hypoxemia, interstitial lung disease.  No OSA on polysomnography and no evidence of chronic PE on VQ scan.  Given significant group 3 disease and parenchymal disease, prescribed [name] 05/2021 in effort to minimize VQ mismatch.   Dry weight difficult to assess given she is starting to gain weight after significant diarrhea and malnutrition early summer 2022.  She has worsening dyspnea and hypoxemia since diagnosed with COVID early 07/2021.  Chest x-ray concerning for development of post viral scarring with subsequent CT chest 01/2022 demonstrating worsening fibrotic changes.  I had some concern for unmasking of PV OD.  However, she reports improvement on [name] and not worsening.  After COVID infection again had another improvement with up titration of [name] further reducing concern for PV OD.  Currently at goal dose [name] 12 puffs 4 times daily.  We will work to transition her to DPI 64 4 times daily.  Lab work demonstrated normal BNP, to continue Lasix 20 mg daily.  Chronic hypoxemic respiratory failure due to ILD: Worsening since COVID infection 08/2021.  CT high-res 01/2022 worse compared to prior.  Clinically most likely consistent with post-COVID fibrotic changes although worsening  underlying ILD is possible.  Recent lab work indicates normal BNP reflective of fairly well-controlled pulmonary hypertension and fluid retention.  Discussed care with Dr. Vaughan Browner, will arrange follow-up with him for ongoing evaluation of underlying ILD, recent CT changes.  Confusion over current Imuran dose.  Has reports 100 mg daily, our records indicated 150 mg daily. He is to contact us with current dosage.  Discussed role of antifibrotic's, she has weak stomach and frequent GI issues.  She is okay pursuing antifibrotic's, tentatively plan for Esbriet in effort to minimize GI side effects.  Paperwork filled out today.  Cough: Suspect related to up titration of [name].  Some cough persist but overall improved.  GOC: We had a lengthy and frank conversation regarding goals of care.  Given her decline, suspect she will continue to worsen.  She now is requiring 4 to 5 L with exertion and likely will increase in the future.  This is despite aggressive immunosuppressants.  Given the timing of her symptoms fear this reflects a component of post COVID fibrotic changes although certainly at risk for progression of her underlying ILD as well.  I recommended that if she were to get sicker and need more oxygen that is okay but I counseled her that she should not go on the ventilator, I recommended a CODE STATUS of DNR if she were to be admitted to the hospital.  She expressed understanding.  She is is to discuss with her husband moving forward what her wishes would be.  In addition, I introduced the topic of hospice.  I think she qualifies for hospice now although it does not seem she is ready for the services, wishes for ongoing aggressive treatment of her underlying pulmonary issues.  I said at some point we may reach a tipping point where there is not much more we can do to help with her lung disease.  And that she may reach this point sooner than her doctors at some point if things we are doing to her or causing more  harm than good or more suffering or prolonging her discomfort that she should tell us and we should consider hospice services at that time.  Again, encouraged her and her husband to discuss her wishes and goals in the coming days and weeks so that we can best serve her.   Return in about 4 weeks (around 02/20/2022).   Lanier Clam, MD 01/24/2022   This appointment required 45 minutes of patient care (this includes precharting, chart review, review of results, face-to-face care, etc.).

## 2022-01-31 DIAGNOSIS — J45909 Unspecified asthma, uncomplicated: Secondary | ICD-10-CM | POA: Diagnosis not present

## 2022-01-31 DIAGNOSIS — J9601 Acute respiratory failure with hypoxia: Secondary | ICD-10-CM | POA: Diagnosis not present

## 2022-02-02 ENCOUNTER — Encounter: Payer: Self-pay | Admitting: Pulmonary Disease

## 2022-02-02 ENCOUNTER — Telehealth: Payer: Self-pay

## 2022-02-02 ENCOUNTER — Ambulatory Visit: Payer: Medicare PPO | Admitting: Pulmonary Disease

## 2022-02-02 ENCOUNTER — Other Ambulatory Visit: Payer: Self-pay

## 2022-02-02 ENCOUNTER — Other Ambulatory Visit (HOSPITAL_COMMUNITY): Payer: Self-pay

## 2022-02-02 VITALS — BP 122/78 | HR 102 | Temp 98.4°F | Ht 65.0 in | Wt 196.0 lb

## 2022-02-02 DIAGNOSIS — I272 Pulmonary hypertension, unspecified: Secondary | ICD-10-CM

## 2022-02-02 DIAGNOSIS — J849 Interstitial pulmonary disease, unspecified: Secondary | ICD-10-CM | POA: Diagnosis not present

## 2022-02-02 MED ORDER — AZATHIOPRINE 50 MG PO TABS: 150.0000 mg | ORAL_TABLET | Freq: Every day | ORAL | 5 refills | Status: DC

## 2022-02-02 NOTE — Progress Notes (Signed)
Kristie [name]    [id number]    09-21-1951  Primary Care Physician:Moon, Amy A, NP  Referring Physician: Lowella Dandy, NP [place],   [id number]  Problem list: CTD related interstitial lung disease. 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  Followed with Dr. Amil Amen and Marella Chimes at [facility] rheumatology for Sjogren's syndrome with positive ANA, SSA, sicca syndrome and positive rheumatoid factor. 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  History also notable for coronary artery disease with stent placement at [facility].  Echocardiogram at [facility] last year shows moderate pulmonary hypertension which improved on follow up echo.  Evaluated in end of Jan 2021 for elevated WBC count.  SARS-CoV-2 test was negative.  CT with changes of pneumonia and she was given doxycycline in the ED High-res CT scan does not show ongoing pneumonia but has fibrosis and probable UIP pattern  Underwent bronchoscopy on 02/26/2020 to rule out infection given elevated WBC count Given prednisone taper and started on azathioprine 50 mg and Bactrim on 3/21.  Azathioprine increased to 100 mg on 5/21 Has also been evaluated by oncology on 03/26/2020 for elevated WBC count thought to be secondary to inflammation secondary to autoimmune disease.  Pets: No pets  Occupation: Retired Recruitment consultant Exposures: No known exposures.  No mold, hot tub, Jacuzzi.  No down pillows or comforter Smoking history: Never smoked Travel history: No significant travel history Relevant family history: No significant family history of lung  Interim History: Has followed up with Dr. Silas Flood and started on [name] since  June 2022 She developed COVID-19 in September 2022.  Treated with oral antivirals and did not require hospitalization.  She was symptomatic only for 2 days and recovered after that  Follow-up CT this year shows worsening interstitial lung disease and has been referred back to me for reevaluation Notes increasing dyspnea on exertion and oxygen requirements over the past few months  Outpatient Encounter Medications as of 02/02/2022  Medication Sig   albuterol (PROVENTIL) (2.5 MG/3ML) 0.083% nebulizer solution Take 3 mLs (2.5 mg total) by nebulization every 4 (four) hours as needed for wheezing or shortness of breath.   albuterol (VENTOLIN HFA) 108 (90 Base) MCG/ACT inhaler Inhale 2 puffs into the lungs every 4 (four) hours as needed for wheezing or shortness of breath.   amLODipine (NORVASC) 10 MG tablet Take 1 tablet by mouth daily.   amLODIPine-Valsartan-HCTZ 10-160-12.5 MG TABS    Ascorbic Acid (VITAMIN C) 100 MG tablet Take 100 mg by mouth daily.   aspirin EC 81 MG tablet Take 81 mg by mouth daily.   atorvastatin (LIPITOR) 80 MG tablet Take 80 mg by mouth daily.    azaTHIOprine (IMURAN) 50 MG tablet Take 3 tablets (150 mg total) by mouth daily.   budesonide-formoterol (SYMBICORT) 160-4.5 MCG/ACT inhaler Inhale 2 puffs into the lungs 2 (two) times daily.   Calcium 250 MG CAPS Take 500 mg by mouth 2 (two) times daily.   citalopram (CELEXA) 20 MG tablet Take 20 mg by mouth daily.   empagliflozin (JARDIANCE) 10 MG TABS tablet Take 1 tablet by mouth daily.   furosemide (LASIX) 20 MG tablet Take 1  tablet (20 mg total) by mouth daily.   gabapentin (NEURONTIN) 100 MG capsule    hydrOXYzine (ATARAX/VISTARIL) 25 MG tablet Take 25 mg by mouth at bedtime.   levothyroxine (SYNTHROID) 100 MCG tablet Take 100 mcg by mouth daily before breakfast.   lisinopril (ZESTRIL) 5 MG tablet Take by mouth.   loratadine-pseudoephedrine (CLARITIN-D 24-HOUR) 10-240 MG 24 hr tablet Take 1 tablet by mouth daily.    Magnesium 250 MG TABS Take 1 tablet by mouth at bedtime.   montelukast (SINGULAIR) 10 MG tablet Take 10 mg by mouth daily.    Multiple Vitamin (MULTIVITAMIN WITH MINERALS) TABS tablet Take 1 tablet by mouth daily.   ondansetron (ZOFRAN ODT) 4 MG disintegrating tablet Take 1 tablet (4 mg total) by mouth every 8 (eight) hours as needed for nausea or vomiting.   pantoprazole (PROTONIX) 40 MG tablet TAKE 1 TABLET DAILY 30 TO 60 MINUTES BEFORE FIRST MEAL OF THE DAY   pimecrolimus (ELIDEL) 1 % cream Apply 1 application topically 2 (two) times daily.   ranolazine (RANEXA) 500 MG 12 hr tablet    sulfamethoxazole-trimethoprim (BACTRIM DS) 800-160 MG tablet TAKE 1 TABLET BY MOUTH 3 TIMES A WEEK   tiZANidine (ZANAFLEX) 4 MG capsule Take 1 capsule (4 mg total) by mouth 3 (three) times daily. (Patient taking differently: Take 4 mg by mouth 3 (three) times daily as needed.)   No facility-administered encounter medications on file as of 02/02/2022.   Physical Exam: Blood pressure 122/78, pulse (!) 102, temperature 98.4 F (36.9 C), temperature source Oral, height _0  (1.651 m), weight 196 lb (88.9 kg), SpO2 96 %. Gen:      No acute distress HEENT:  EOMI, sclera anicteric Neck:     No masses; no thyromegaly Lungs:    Bibasal crackles CV:         Regular rate and rhythm; no murmurs Abd:      + bowel sounds; soft, non-tender; no palpable masses, no distension Ext:    No edema; adequate peripheral perfusion Skin:      Warm and dry; no rash Neuro: alert and oriented x 3 Psych: normal mood and affect   Data Reviewed: Imaging: CT high-resolution 07/24/2019-patchy groundglass with septal thickening, reticulation, bronchiectasis with no honeycombing.  Basal gradient noted.  Probable UIP pattern  CTA 01/10/2020-No PE, mild bibasal consolidation.  Chronic ILD.  CT high-resolution 02/05/2020-pulmonary fibrosis and probable UIP pattern without progression since 2020.  CT high-resolution 01/16/2022-progressive  interstitial lung disease and probable UIP pattern, mild cardiomegaly, aortic and coronary atherosclerosis I have reviewed the images personally.  PFTs:  03/09/2020 FVC 1.74 (54%), FEV1 1.51 [61%],/57, TLC 2.75 [52%], DLCO 8.44 [41%] Severe restriction and diffusion defect.  08/03/2020 FVC 1.74 [54%], FEV1 1.61 [6 6%], F/F 93 TLC 2.75 [53%], DLCO 9.52 [47%] Severe restriction and diffusion defect with improvement in DLCO compared to prior  6-minute walk test 02/06/2020-374 m, nadir O2 sat 94%  Labs: CBC 05/29/2019-WBC 13.2, eos 6.9%, absolute eosinophil count 911 CBC 03/26/2020 - WBC 14.8 eos 8%, absolute eosinophil count 1184  ANA 05/29/2019- > 1:1280 Repeat CTD serologies 01/09/2020-ANA 1 is to 1280, rheumatoid factor 14, positive SSA  SARS-CoV-2 01/10/2020- negative  Bronchoscopy 02/26/2020- Cell count 113, 42% lymphs Microbiology, cytology -negative  Cardiac: Echocardiogram ([facility]) 04/18/2019 Normal LV size, LVEF 30-07%, grade 1 diastolic dysfunction.  RVSP of 40-50 consistent with moderate pulmonary  Echocardiogram 01/12/2020 Normal LVEF 62-26%, grade 1 diastolic dysfunction, RVSP 35  Assessment:  CTD related interstitial lung disease, pulmonary  fibrosis Sjogren's syndrome Continues on Imuran.  She was supposed to be on 150 mg but is taking only 50 mg in twice daily dosing for unclear reason. Her CT scan shows progression of fibrosis which is concerning Dr. Silas Flood has already discussed Esbriet with her and agree with starting it.  LFTs earlier this month are normal We discussed Imuran dosing and she will now take 150 mg once daily.  We can consider alternative immunosuppressant such as CellCept or Rituxan but I do want to start multiple medications at the same time.   Reassess at return visit  Referral to [facility] for transplant.  She may not be a candidate due to pulmonary hypertension but patient is interested in exploring this option Referral to pulmonary rehab, repeat  PFTs  Pulmonary hypertension On [name].,  proBNP earlier this month was normal She will need reassessment of PA pressures which will likely be done at [facility]  Plan/Recommendations: - Increase Imuran to 150 mg a day - Continue Bactrim - Start Esbriet - Referral for lung transplant - Continue [name] - Pulmonary rehab  Marshell Garfinkel MD [facility] 02/02/2022, 10:26 AM  CC: Lowella Dandy, NP

## 2022-02-02 NOTE — Telephone Encounter (Addendum)
Received New start paperwork for ESBRIET. Will update as we work through the benefits process.  Received notification from [facility] regarding a prior authorization for PIRFENIDONE. Authorization has been APPROVED from 12/11/2021 to 12/10/2022. Approval letter sent to scan center.  Per test claim, copay for 30 days supply is $10.00  Patient can fill through [place]: [phone]   Authorization # [id number] Key: [facility] [place]

## 2022-02-02 NOTE — Patient Instructions (Signed)
I have reviewed his CT scan which does show worsening of the interstitial lung disease We will send a new prescription for Imuran 150 mg a day.  Make sure that you take all the 3 tablets at once We will start [place] paperwork Referral to [facility] for lung transplant eval

## 2022-02-03 DIAGNOSIS — Z6833 Body mass index (BMI) 33.0-33.9, adult: Secondary | ICD-10-CM | POA: Diagnosis not present

## 2022-02-03 DIAGNOSIS — F330 Major depressive disorder, recurrent, mild: Secondary | ICD-10-CM | POA: Diagnosis not present

## 2022-02-03 NOTE — Telephone Encounter (Signed)
Called and spoke with patient, she is feeling some better.  Patient was seen yesterday.  Closing encounter.  Nothing further needed.

## 2022-02-16 ENCOUNTER — Telehealth: Payer: Self-pay | Admitting: Pulmonary Disease

## 2022-02-17 ENCOUNTER — Telehealth: Payer: Self-pay | Admitting: Pharmacist

## 2022-02-17 DIAGNOSIS — Z5181 Encounter for therapeutic drug level monitoring: Secondary | ICD-10-CM

## 2022-02-17 DIAGNOSIS — J849 Interstitial pulmonary disease, unspecified: Secondary | ICD-10-CM

## 2022-02-17 MED ORDER — PIRFENIDONE 267 MG PO TABS: 801.0000 mg | ORAL_TABLET | Freq: Three times a day (TID) | ORAL | 4 refills | Status: DC

## 2022-02-17 MED ORDER — PIRFENIDONE 267 MG PO TABS: ORAL_TABLET | ORAL | 0 refills | Status: DC

## 2022-02-17 NOTE — Telephone Encounter (Signed)
? ?Subjective:  ?Patient presents today to [facility] to see pharmacy team for pirfenidone new start.   Patient was last seen by Dr. Vaughan Browner on 02/02/22.  Pertinent past medical history includes ILD w PMH significant form Sjogren's syndrome, CAD w stent placement, PAH (on [name]), GERD.  She is naive to antifibrotics but does take [name] (started 06/21/21) ? ?History of elevated LFTs: No ?History of diarrhea, nausea, vomiting: No ? ?Objective: ?Allergies  ?Allergen Reactions  ? Penicillins Itching and Other (See Comments)  ?  "Cillin" Family - Amoxicillin - itching  ?  ?Did it involve swelling of the face/tongue/throat, SOB, or low BP? Yes ?Did it involve sudden or severe rash/hives, skin peeling, or any reaction on the inside of your mouth or nose? no ?Did you need to seek medical attention at a hospital or doctor's office? yes ?When did it last happen?      30 years ?If all above answers are "NO", may proceed with cephalosporin use.  ? Doxycycline Rash  ? ? ?Outpatient Encounter Medications as of 02/17/2022  ?Medication Sig  ? albuterol (PROVENTIL) (2.5 MG/3ML) 0.083% nebulizer solution Take 3 mLs (2.5 mg total) by nebulization every 4 (four) hours as needed for wheezing or shortness of breath.  ? albuterol (VENTOLIN HFA) 108 (90 Base) MCG/ACT inhaler Inhale 2 puffs into the lungs every 4 (four) hours as needed for wheezing or shortness of breath.  ? amLODipine (NORVASC) 10 MG tablet Take 1 tablet by mouth daily.  ? amLODIPine-Valsartan-HCTZ 10-160-12.5 MG TABS   ? Ascorbic Acid (VITAMIN C) 100 MG tablet Take 100 mg by mouth daily.  ? aspirin EC 81 MG tablet Take 81 mg by mouth daily.  ? atorvastatin (LIPITOR) 80 MG tablet Take 80 mg by mouth daily.   ? azaTHIOprine (IMURAN) 50 MG tablet Take 3 tablets (150 mg total) by mouth daily.  ? budesonide-formoterol (SYMBICORT) 160-4.5 MCG/ACT inhaler Inhale 2 puffs into the lungs 2 (two) times daily.  ? Calcium 250 MG CAPS Take 500 mg by mouth 2 (two) times daily.  ?  citalopram (CELEXA) 20 MG tablet Take 20 mg by mouth daily.  ? empagliflozin (JARDIANCE) 10 MG TABS tablet Take 1 tablet by mouth daily.  ? furosemide (LASIX) 20 MG tablet Take 1 tablet (20 mg total) by mouth daily.  ? gabapentin (NEURONTIN) 100 MG capsule   ? hydrOXYzine (ATARAX/VISTARIL) 25 MG tablet Take 25 mg by mouth at bedtime.  ? levothyroxine (SYNTHROID) 100 MCG tablet Take 100 mcg by mouth daily before breakfast.  ? lisinopril (ZESTRIL) 5 MG tablet Take by mouth.  ? loratadine-pseudoephedrine (CLARITIN-D 24-HOUR) 10-240 MG 24 hr tablet Take 1 tablet by mouth daily.  ? Magnesium 250 MG TABS Take 1 tablet by mouth at bedtime.  ? montelukast (SINGULAIR) 10 MG tablet Take 10 mg by mouth daily.   ? Multiple Vitamin (MULTIVITAMIN WITH MINERALS) TABS tablet Take 1 tablet by mouth daily.  ? ondansetron (ZOFRAN ODT) 4 MG disintegrating tablet Take 1 tablet (4 mg total) by mouth every 8 (eight) hours as needed for nausea or vomiting.  ? pantoprazole (PROTONIX) 40 MG tablet TAKE 1 TABLET DAILY 30 TO 60 MINUTES BEFORE FIRST MEAL OF THE DAY  ? pimecrolimus (ELIDEL) 1 % cream Apply 1 application topically 2 (two) times daily.  ? ranolazine (RANEXA) 500 MG 12 hr tablet   ? sulfamethoxazole-trimethoprim (BACTRIM DS) 800-160 MG tablet TAKE 1 TABLET BY MOUTH 3 TIMES A WEEK  ? tiZANidine (ZANAFLEX) 4 MG capsule Take 1  capsule (4 mg total) by mouth 3 (three) times daily. (Patient taking differently: Take 4 mg by mouth 3 (three) times daily as needed.)  ? ?No facility-administered encounter medications on file as of 02/17/2022.  ?  ? ?Immunization History  ?Administered Date(s) Administered  ? Fluad Quad(high Dose 65+) 11/10/2020  ? Influenza, High Dose Seasonal PF 08/29/2019, 01/09/2022  ? Moderna Sars-Covid-2 Vaccination 01/01/2020, 01/26/2020, 11/03/2020  ?  ?PFT's ?TLC  ?Date Value Ref Range Status  ?08/03/2020 2.75 L Final  ?  ?CMP  ?   ?Component Value Date/Time  ? NA 136 11/22/2021 1428  ? K 4.2 11/22/2021 1428  ? CL 102  11/22/2021 1428  ? CO2 26 11/22/2021 1428  ? GLUCOSE 102 (H) 11/22/2021 1428  ? BUN 17 11/22/2021 1428  ? CREATININE 0.93 11/22/2021 1428  ? CREATININE 0.86 03/26/2020 1425  ? CALCIUM 9.4 11/22/2021 1428  ? PROT 7.6 01/23/2022 1433  ? ALBUMIN 4.4 01/23/2022 1433  ? AST 24 01/23/2022 1433  ? AST 57 (H) 03/26/2020 1425  ? ALT 12 01/23/2022 1433  ? ALT 34 03/26/2020 1425  ? ALKPHOS 76 01/23/2022 1433  ? BILITOT 0.3 01/23/2022 1433  ? BILITOT 0.4 03/26/2020 1425  ? GFRNONAA >60 03/26/2020 1425  ? GFRAA >60 03/26/2020 1425  ?  ?CBC ?   ?Component Value Date/Time  ? WBC 13.0 (H) 01/05/2021 1212  ? RBC 3.59 (L) 01/05/2021 1212  ? HGB 10.9 (L) 01/05/2021 1212  ? HGB 13.2 03/26/2020 1425  ? HCT 33.5 (L) 01/05/2021 1212  ? PLT 453.0 (H) 01/05/2021 1212  ? PLT 431 (H) 03/26/2020 1425  ? MCV 93.6 01/05/2021 1212  ? MCH 29.1 03/26/2020 1425  ? MCHC 32.5 01/05/2021 1212  ? RDW 15.4 01/05/2021 1212  ? LYMPHSABS 1.9 01/05/2021 1212  ? MONOABS 1.1 (H) 01/05/2021 1212  ? EOSABS 0.7 01/05/2021 1212  ? BASOSABS 0.1 01/05/2021 1212  ?  ?LFT's ?Hepatic Function Latest Ref Rng & Units 01/23/2022 03/26/2020 02/06/2020  ?Total Protein 6.0 - 8.3 g/dL 7.6 7.2 5.8(L)  ?Albumin 3.5 - 5.2 g/dL 4.4 3.8 3.4(L)  ?AST 0 - 37 U/L 24 57(H) 20  ?ALT 0 - 35 U/L 12 34 18  ?Alk Phosphatase 39 - 117 U/L 76 119 53  ?Total Bilirubin 0.2 - 1.2 mg/dL 0.3 0.4 0.4  ?Bilirubin, Direct 0.0 - 0.3 mg/dL 0.0 - -  ? ?HRCT (01/22/21) - Spectrum of findings compatible with basilar predominant fibrotic interstitial lung disease without frank honeycombing, mildly progressed in the interval. Findings are categorized as probable UIP per consensus guidelines ? ?Assessment and Plan ? ?Esbriet Medication Management ?Thoroughly counseled patient on the efficacy, mechanism of action, dosing, administration, adverse effects, and monitoring parameters of Esbriet.  Patient verbalized understanding. Patient education handout provided.  ? ?Goals of Therapy: Will not stop or reverse the  progression of ILD. It will slow the progression of ILD.  ? ?Dosing: Starting dose will be Esbriet 267 mg 1 tablet three times daily for 7 days, then 2 tablets three times daily for 7 days, then 3 tablets three times daily.  Maintenance dose will be 801 mg 1 tablet three times daily if tolerated.  Stressed the importance of taking with meals and space at least 5-6 hours apart to minimize stomach upset. ? ?Adverse Effects: ?Nausea, vomiting, diarrhea, weight loss ?Abdominal pain ?GERD - take pantoprazole 5m daily ?Sun sensitivity/rash - patient advised to wear sunscreen when exposed to sunlight ?Dizziness ?Fatigue ? ?Monitoring: ?Monitor for diarrhea, nausea and vomiting,  GI perforation, hepatotoxicity  ?Monitor LFTs - baseline, monthly for first 6 months, then every 3 months routinely ?CBC w differential at baseline and every 3 months routinely ? ?Access: ?Approval of pirfenidone through: insurance. Her copay is $10 per month ?Rx sent to: [place] (pulmonary fibrosis team). [phone] ? ?Medication Reconciliation ?A drug regimen assessment was performed, including review of allergies, interactions, disease-state management, dosing and immunization history. Medications were reviewed with the patient, including name, instructions, indication, goals of therapy, potential side effects, importance of adherence, and safe use. ? ?Immunizations ?Patient has received 3 COVID19 vaccines.  She is UTD on influenza vaccine ? ?This appointment required 30 minutes of patient care (this includes precharting, chart review, review of results, face-to-face care, etc.). ? ?Thank you for involving pharmacy to assist in providing this patient's care.  ? ?Knox Saliva, PharmD, MPH, BCPS ?Clinical Pharmacist (Rheumatology and Pulmonology) ?

## 2022-02-17 NOTE — Telephone Encounter (Signed)
Dr Vaughan Browner- pt seeing [facility] 4/5 and getting PFT done. Did you still want to see her on 03/31/22? She is scheduled to see you and get a PFT that day. Please advise, thank you! ?

## 2022-02-17 NOTE — Telephone Encounter (Signed)
Patient counseled on Esbriet/pirfenidone in separate telephone encounter ? ?Knox Saliva, PharmD, MPH, BCPS ?Clinical Pharmacist (Rheumatology and Pulmonology) ?

## 2022-02-20 DIAGNOSIS — J8410 Pulmonary fibrosis, unspecified: Secondary | ICD-10-CM | POA: Diagnosis not present

## 2022-02-20 DIAGNOSIS — I2723 Pulmonary hypertension due to lung diseases and hypoxia: Secondary | ICD-10-CM | POA: Diagnosis not present

## 2022-02-21 ENCOUNTER — Ambulatory Visit: Payer: Medicare PPO | Admitting: Pulmonary Disease

## 2022-02-21 ENCOUNTER — Telehealth: Payer: Self-pay | Admitting: Pulmonary Disease

## 2022-02-21 NOTE — Telephone Encounter (Signed)
Spoke to [employer] with [facility].  ?Pam stated that patient insurance is out of network and  esbriet has been transferred to [facility]. ?[facility] has made patient aware. ?Nothing further needed.  ? ?

## 2022-02-22 NOTE — Telephone Encounter (Signed)
We can cancel the PFTs and visit.  Reschedule just a clinic visit for May with me ?

## 2022-02-22 NOTE — Telephone Encounter (Signed)
Called and spoke with patient.  Dr. Matilde Bash recommendations given.  PFT at [facility] cancelled.  Patient follow up is scheduled 03/31/22.  Nothing further at this time. ?

## 2022-02-23 ENCOUNTER — Other Ambulatory Visit: Payer: Self-pay | Admitting: Pulmonary Disease

## 2022-02-27 ENCOUNTER — Telehealth: Payer: Self-pay | Admitting: Pulmonary Disease

## 2022-02-27 NOTE — Telephone Encounter (Signed)
Correct spelling of medication is pirfenidone (Esbriet). Routing this to pharmacy team. ?

## 2022-02-28 DIAGNOSIS — J9601 Acute respiratory failure with hypoxia: Secondary | ICD-10-CM | POA: Diagnosis not present

## 2022-02-28 DIAGNOSIS — J45909 Unspecified asthma, uncomplicated: Secondary | ICD-10-CM | POA: Diagnosis not present

## 2022-02-28 NOTE — Telephone Encounter (Signed)
Returned call to [employer] regarding patient's pirfenidone. Spoke with Laurine Blazer, at [employer] ? ?Phone: [phone] ? ?Knox Saliva, PharmD, MPH, BCPS ?Clinical Pharmacist (Rheumatology and Pulmonology) ?

## 2022-03-14 DIAGNOSIS — M3500 Sicca syndrome, unspecified: Secondary | ICD-10-CM | POA: Diagnosis not present

## 2022-03-14 DIAGNOSIS — M797 Fibromyalgia: Secondary | ICD-10-CM | POA: Diagnosis not present

## 2022-03-14 DIAGNOSIS — I272 Pulmonary hypertension, unspecified: Secondary | ICD-10-CM | POA: Diagnosis not present

## 2022-03-14 DIAGNOSIS — G894 Chronic pain syndrome: Secondary | ICD-10-CM | POA: Diagnosis not present

## 2022-03-14 DIAGNOSIS — E669 Obesity, unspecified: Secondary | ICD-10-CM | POA: Diagnosis not present

## 2022-03-14 DIAGNOSIS — D72829 Elevated white blood cell count, unspecified: Secondary | ICD-10-CM | POA: Diagnosis not present

## 2022-03-14 DIAGNOSIS — J849 Interstitial pulmonary disease, unspecified: Secondary | ICD-10-CM | POA: Diagnosis not present

## 2022-03-14 DIAGNOSIS — Z6833 Body mass index (BMI) 33.0-33.9, adult: Secondary | ICD-10-CM | POA: Diagnosis not present

## 2022-03-15 DIAGNOSIS — Z7682 Awaiting organ transplant status: Secondary | ICD-10-CM | POA: Diagnosis not present

## 2022-03-15 DIAGNOSIS — Z01818 Encounter for other preprocedural examination: Secondary | ICD-10-CM | POA: Diagnosis not present

## 2022-03-15 DIAGNOSIS — F54 Psychological and behavioral factors associated with disorders or diseases classified elsewhere: Secondary | ICD-10-CM | POA: Diagnosis not present

## 2022-03-15 DIAGNOSIS — Z008 Encounter for other general examination: Secondary | ICD-10-CM | POA: Diagnosis not present

## 2022-03-15 DIAGNOSIS — Z1159 Encounter for screening for other viral diseases: Secondary | ICD-10-CM | POA: Diagnosis not present

## 2022-03-15 DIAGNOSIS — K449 Diaphragmatic hernia without obstruction or gangrene: Secondary | ICD-10-CM | POA: Diagnosis not present

## 2022-03-15 DIAGNOSIS — K224 Dyskinesia of esophagus: Secondary | ICD-10-CM | POA: Diagnosis not present

## 2022-03-15 DIAGNOSIS — J849 Interstitial pulmonary disease, unspecified: Secondary | ICD-10-CM | POA: Diagnosis not present

## 2022-03-15 DIAGNOSIS — M3500 Sicca syndrome, unspecified: Secondary | ICD-10-CM | POA: Diagnosis not present

## 2022-03-15 DIAGNOSIS — I272 Pulmonary hypertension, unspecified: Secondary | ICD-10-CM | POA: Diagnosis not present

## 2022-03-16 DIAGNOSIS — Z01818 Encounter for other preprocedural examination: Secondary | ICD-10-CM | POA: Diagnosis not present

## 2022-03-16 DIAGNOSIS — I517 Cardiomegaly: Secondary | ICD-10-CM | POA: Diagnosis not present

## 2022-03-16 DIAGNOSIS — Z7682 Awaiting organ transplant status: Secondary | ICD-10-CM | POA: Diagnosis not present

## 2022-03-16 DIAGNOSIS — R0602 Shortness of breath: Secondary | ICD-10-CM | POA: Diagnosis not present

## 2022-03-16 DIAGNOSIS — R942 Abnormal results of pulmonary function studies: Secondary | ICD-10-CM | POA: Diagnosis not present

## 2022-03-16 DIAGNOSIS — I081 Rheumatic disorders of both mitral and tricuspid valves: Secondary | ICD-10-CM | POA: Diagnosis not present

## 2022-03-20 DIAGNOSIS — I2723 Pulmonary hypertension due to lung diseases and hypoxia: Secondary | ICD-10-CM | POA: Diagnosis not present

## 2022-03-22 ENCOUNTER — Telehealth: Payer: Self-pay | Admitting: Pulmonary Disease

## 2022-03-22 DIAGNOSIS — J9611 Chronic respiratory failure with hypoxia: Secondary | ICD-10-CM

## 2022-03-22 NOTE — Telephone Encounter (Signed)
Attempted to call pt's spouse Abe People but unable to reach. Left message for him to return call ?

## 2022-03-23 NOTE — Telephone Encounter (Signed)
Called and spoke with patient's husband. He is aware that I will send an order to [facility]. He is also aware that we will wait for Dr. Matilde Bash response in regards to the follow up visit.  ? ?Order for O2 has been placed.  ? ?Nothing further needed at time of call for now.  ?

## 2022-03-23 NOTE — Telephone Encounter (Signed)
Changes in 02 ok with me - may need hospice care if this not adequate but I'll defer this call to Dr Vaughan Browner  ?

## 2022-03-23 NOTE — Telephone Encounter (Signed)
Called and spoke with pt's spouse Abe People who stated pt had a visit with [facility] and they performed a pulmonary function test and also did an exercising titration walk test. During the walk test, it was determined that pt was requiring 15L with exertion. ? ?Abe People stated that at rest, pt uses 5L O2. Pt has been using a POC but was told by [facility] that she should really be using continuous flow instead of pulsed flow.  Asked Abe People how many liters pt's current concentrator goes up to and he said that it is only a Buyer, retail. ? ?Stated to [place] that we would see about placing order for larger concentrator and also stated that we will probably need to get pt more tanks for her to use for when she goes out, especially if she is now requiring up to 15L with exertion and he verbalized understanding. ? ?Abe People stated that [facility] told them that she has been denied being a candidate for lung transplant. Due to this, we need to know when pt needs to be seen at our office again by Dr. Vaughan Browner. ? ? ?Routing this to Dr. Melvyn Novas to see if he is okay with [place] placing order under him for pt to receive a larger concentrator and also more tanks for her to have when she comes to office visits.   ? ? ?Also routing this to Dr. Vaughan Browner for him to review when he is back in office to see when pt needs to be scheduled for a follow up visit. ?

## 2022-03-23 NOTE — Telephone Encounter (Signed)
Abe People husband is returning phone call. [place] phone number is [phone]. ?

## 2022-03-28 NOTE — Telephone Encounter (Signed)
Please make follow up visit at next available. Thanks ?

## 2022-03-29 NOTE — Telephone Encounter (Signed)
Patient has OV scheduled 03/31/22 for follow up. ?

## 2022-03-31 ENCOUNTER — Ambulatory Visit (INDEPENDENT_AMBULATORY_CARE_PROVIDER_SITE_OTHER): Payer: Medicare PPO | Admitting: Pulmonary Disease

## 2022-03-31 ENCOUNTER — Telehealth: Payer: Self-pay | Admitting: Pharmacist

## 2022-03-31 ENCOUNTER — Encounter: Payer: Self-pay | Admitting: Pulmonary Disease

## 2022-03-31 VITALS — BP 120/68 | HR 84 | Temp 97.8°F | Ht 65.0 in | Wt 198.0 lb

## 2022-03-31 DIAGNOSIS — J849 Interstitial pulmonary disease, unspecified: Secondary | ICD-10-CM | POA: Diagnosis not present

## 2022-03-31 DIAGNOSIS — Z5181 Encounter for therapeutic drug level monitoring: Secondary | ICD-10-CM

## 2022-03-31 DIAGNOSIS — I272 Pulmonary hypertension, unspecified: Secondary | ICD-10-CM | POA: Diagnosis not present

## 2022-03-31 NOTE — Patient Instructions (Signed)
Continue the medications as prescribed ?We will check with pharmacy if we can change tyvaso to a inhaler ?When you meet your primary care please let her know that we would like the following labs ordered.  Please check comprehensive metabolic panel and CBC ?Please have your primary care fax these results to [place] ?Also discussed referral to palliative care with your primary care doctor ?Follow-up in 3 months. ?

## 2022-03-31 NOTE — Telephone Encounter (Addendum)
Submitted a Prior Authorization request to [facility] for [name] DPI via CoverMyMeds. Will update once we receive a response. ? ?Key: [id number] ? ?Currently at [name] neb soln 12 puffs four times daily -> conversion to MDI formulation is 25mg four times daily ? ?DKnox Saliva PharmD, MPH, BCPS ?Clinical Pharmacist (Rheumatology and Pulmonology) ? ?----- Message from PMarshell Garfinkel MD sent at 03/31/2022  3:30 PM EDT ----- ?Can she be switched to [name] MDI?  Not sure what the processes is.  Thanks ? ? ?

## 2022-03-31 NOTE — Progress Notes (Signed)
[name] DPI BIV started in telephone encounter ? ?Knox Saliva, PharmD, MPH, BCPS ?Clinical Pharmacist (Rheumatology and Pulmonology) ?

## 2022-03-31 NOTE — Progress Notes (Signed)
? ?      ?Kristie [name]    [id number]    Dec 02, 1951 ? ?Primary Care Physician:Moon, Amy A, NP ? ?Referring Physician: Lowella Dandy, NP ?[place] [address] ? ?Problem list: ?CTD related interstitial lung disease. On Imuran since March 2021 ?Sjogren's syndrome ?Pulmonary hypertension.  On [name] since June 2022 ? ?HPI: ?71 year old with history of CTD ILD, Sjogrens ?  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  Followed with Dr. Amil Amen and Marella Chimes at [facility] rheumatology for Sjogren's syndrome with positive ANA, SSA, sicca syndrome and positive rheumatoid factor. 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  History also notable for coronary artery disease with stent placement at [facility].  Echocardiogram at [facility] last year shows moderate pulmonary hypertension which improved on follow up echo. ? ?Evaluated in end of Jan 2021 for elevated WBC count.  SARS-CoV-2 test was negative.  CT with changes of pneumonia and she was given doxycycline in the ED ?High-res CT scan does not show ongoing pneumonia but has fibrosis and probable UIP pattern ? ?Underwent bronchoscopy on 02/26/2020 to rule out infection given elevated WBC count ?Given prednisone taper and started on azathioprine 50 mg and Bactrim on 3/21.  Azathioprine increased to 100 mg on 5/21 ?Has also been evaluated by oncology on 03/26/2020 for elevated WBC count thought to be secondary to inflammation secondary to autoimmune disease. ? ?Has followed up with Dr. Silas Flood and started on [name] since June 2022 ?She developed COVID-19 in September 2022.  Treated with oral antivirals and did not require hospitalization.  She was symptomatic only for 2 days and recovered after that ? ?Pets: No pets  ?Occupation: Retired Recruitment consultant ?Exposures: No known exposures.  No mold, hot tub, Jacuzzi.  No down  pillows or comforter ?Smoking history: Never smoked ?Travel history: No significant travel history ?Relevant family history: No significant family history of lung ? ?Interim History: ?Follow-up CT this year shows worsening interstitial lung disease and has been referred back to me for reevaluation.  ? ?Earlier this year we increased her azathioprine to 150 mg a day and started Esbriet.  She has been tolerating it well so far without any issue ?At her request we also referred her to [facility] for transplant evaluation but was turned down due to multiple comorbidities, obesity and pulmonary hypertension ? ?She is stable so far.  Tolerating the medications well.  Has chronic dyspnea on exertion ?On supplemental oxygen 5 L at rest and 15 L with exertion ? ?Outpatient Encounter Medications as of 03/31/2022  ?Medication Sig  ? albuterol (PROVENTIL) (2.5 MG/3ML) 0.083% nebulizer solution Take 3 mLs (2.5 mg total) by nebulization every 4 (four) hours as needed for wheezing or shortness of breath.  ? albuterol (VENTOLIN HFA) 108 (90 Base) MCG/ACT inhaler Inhale 2 puffs into the lungs every 4 (four) hours as needed for wheezing or shortness of breath.  ? amLODipine (NORVASC) 10 MG tablet Take 1 tablet by mouth daily.  ? amLODIPine-Valsartan-HCTZ 10-160-12.5 MG TABS   ? Ascorbic Acid (VITAMIN C) 100 MG tablet Take 100 mg by mouth daily.  ? aspirin EC 81 MG tablet Take 81 mg by mouth daily.  ? atorvastatin (LIPITOR) 80 MG tablet Take 80 mg by mouth daily.   ? azaTHIOprine (IMURAN) 50 MG tablet Take 3 tablets (150 mg total) by mouth  daily.  ? budesonide-formoterol (SYMBICORT) 160-4.5 MCG/ACT inhaler Inhale 2 puffs into the lungs 2 (two) times daily.  ? Calcium 250 MG CAPS Take 500 mg by mouth 2 (two) times daily.  ? citalopram (CELEXA) 20 MG tablet Take 20 mg by mouth daily.  ? empagliflozin (JARDIANCE) 10 MG TABS tablet Take 1 tablet by mouth daily.  ? furosemide (LASIX) 20 MG tablet Take 1 tablet (20 mg total) by mouth daily.  ?  gabapentin (NEURONTIN) 100 MG capsule   ? hydrOXYzine (ATARAX/VISTARIL) 25 MG tablet Take 25 mg by mouth at bedtime.  ? levothyroxine (SYNTHROID) 100 MCG tablet Take 100 mcg by mouth daily before breakfast.  ? lisinopril (ZESTRIL) 5 MG tablet Take by mouth.  ? loratadine-pseudoephedrine (CLARITIN-D 24-HOUR) 10-240 MG 24 hr tablet Take 1 tablet by mouth daily.  ? Magnesium 250 MG TABS Take 1 tablet by mouth at bedtime.  ? montelukast (SINGULAIR) 10 MG tablet Take 10 mg by mouth daily.   ? Multiple Vitamin (MULTIVITAMIN WITH MINERALS) TABS tablet Take 1 tablet by mouth daily.  ? ondansetron (ZOFRAN ODT) 4 MG disintegrating tablet Take 1 tablet (4 mg total) by mouth every 8 (eight) hours as needed for nausea or vomiting.  ? pantoprazole (PROTONIX) 40 MG tablet TAKE 1 TABLET BY MOUTH DAILY 30 TO 60 MINUTES BEFORE FIRST MEAL OF THE DAY  ? pimecrolimus (ELIDEL) 1 % cream Apply 1 application topically 2 (two) times daily.  ? Pirfenidone (ESBRIET) 267 MG TABS Take 1 tab three times daily for 7 days, then 2 tabs three times daily for 7 days, then 3 tabs three times daily thereafter.  ? Pirfenidone (ESBRIET) 267 MG TABS Take 3 tablets (801 mg total) by mouth 3 (three) times daily with meals.  ? ranolazine (RANEXA) 500 MG 12 hr tablet   ? sulfamethoxazole-trimethoprim (BACTRIM DS) 800-160 MG tablet TAKE 1 TABLET BY MOUTH 3 TIMES A WEEK  ? tiZANidine (ZANAFLEX) 4 MG capsule Take 1 capsule (4 mg total) by mouth 3 (three) times daily. (Patient taking differently: Take 4 mg by mouth 3 (three) times daily as needed.)  ? ?No facility-administered encounter medications on file as of 03/31/2022.  ? ?Physical Exam: ?Blood pressure 120/68, pulse 84, temperature 97.8 ?F (36.6 ?C), temperature source Oral, height _0  (1.651 m), weight 198 lb (89.8 kg), SpO2 93 %. ?Gen:      No acute distress ?HEENT:  EOMI, sclera anicteric ?Neck:     No masses; no thyromegaly ?Lungs:    Clear to auscultation bilaterally; normal respiratory effort ?CV:          Regular rate and rhythm; no murmurs ?Abd:      + bowel sounds; soft, non-tender; no palpable masses, no distension ?Ext:    No edema; adequate peripheral perfusion ?Skin:      Warm and dry; no rash ?Neuro: alert and oriented x 3 ?Psych: normal mood and affect  ? ?Data Reviewed: ?Imaging: ?CT high-resolution 07/24/2019-patchy groundglass with septal thickening, reticulation, bronchiectasis with no honeycombing.  Basal gradient noted.  Probable UIP pattern ? ?CTA 01/10/2020-No PE, mild bibasal consolidation.  Chronic ILD. ? ?CT high-resolution 02/05/2020-pulmonary fibrosis and probable UIP pattern without progression since 2020. ? ?CT high-resolution 01/16/2022-progressive interstitial lung disease and probable UIP pattern, mild cardiomegaly, aortic and coronary atherosclerosis ?I have reviewed the images personally. ? ?PFTs:  ?03/09/2020 ?FVC 1.74 (54%), FEV1 1.51 [61%],/57, TLC 2.75 [52%], DLCO 8.44 [41%] ?Severe restriction and diffusion defect. ? ?08/03/2020 ?FVC 1.74 [54%], FEV1 1.61 [6 6%], F/F  93 TLC 2.75 [53%], DLCO 9.52 [47%] ?Severe restriction and diffusion defect with improvement in DLCO compared to prior ? ?6-minute walk test 02/06/2020-374 m, nadir O2 sat 94% ? ?Labs: ?CBC 05/29/2019-WBC 13.2, eos 6.9%, absolute eosinophil count 911 ?CBC 03/26/2020 - WBC 14.8 eos 8%, absolute eosinophil count 1184 ? ?ANA 05/29/2019- > 1:1280 ?Repeat CTD serologies 01/09/2020-ANA 1 is to 1280, rheumatoid factor 14, positive SSA ? ?SARS-CoV-2 01/10/2020- negative ? ?Bronchoscopy 02/26/2020- ?Cell count 113, 42% lymphs ?Microbiology, cytology -negative ? ?Cardiac: ?Echocardiogram ([facility]) 04/18/2019 ?Normal LV size, LVEF 38-88%, grade 1 diastolic dysfunction.  RVSP of 40-50 consistent with moderate pulmonary ? ?Echocardiogram 01/12/2020 ?Normal LVEF 28-00%, grade 1 diastolic dysfunction, RVSP 35 ? ?Assessment:  ?CTD related interstitial lung disease, pulmonary fibrosis ?Sjogren's syndrome ?Continue on Imuran 150 mg and Esbriet  started for progressive pulmonary fibrosis. ?We can consider alternative immunosuppressant such as CellCept or Rituxan but I do want to start multiple medications at the same time.   ? ?She will get follow-

## 2022-04-03 ENCOUNTER — Other Ambulatory Visit (HOSPITAL_COMMUNITY): Payer: Self-pay

## 2022-04-03 NOTE — Telephone Encounter (Signed)
Received notification from [facility] regarding a prior authorization for [name] DPI. Authorization has been APPROVED from 12/11/2021 to 12/10/2022. Approval letter sent to scan center. ? ?Per test claim, copay for 28 days supply is $100.00 ? ?Patient can fill through [facility]: [phone]  however due to limited distribution of medicine she will likely need to fill with [facility] (assuming they are able to fill it). ? ?Authorization # [id number] ?

## 2022-04-04 NOTE — Telephone Encounter (Signed)
Provider portion of [name] DPI referral form placed in Dr. Matilde Bash mailbox to be signed. ? ?Most recent OV note, insurance card copy, [name] DPI prior auth approval letter and HRCT placed in [name] folder in pharmacy office ? ?Knox Saliva, PharmD, MPH, BCPS ?Clinical Pharmacist (Rheumatology and Pulmonology) ?

## 2022-04-06 NOTE — Telephone Encounter (Signed)
Submitted completed referral paperwork to [facility] for [name] DPI along with HRCT, right heart cath results, and most recent progress note. ? ?Fax# [phone] ?Phone# [phone] ? ?Knox Saliva, PharmD, MPH, BCPS ?Clinical Pharmacist (Rheumatology and Pulmonology) ?

## 2022-04-11 DIAGNOSIS — I2723 Pulmonary hypertension due to lung diseases and hypoxia: Secondary | ICD-10-CM | POA: Diagnosis not present

## 2022-04-14 DIAGNOSIS — Z1331 Encounter for screening for depression: Secondary | ICD-10-CM | POA: Diagnosis not present

## 2022-04-14 DIAGNOSIS — E669 Obesity, unspecified: Secondary | ICD-10-CM | POA: Diagnosis not present

## 2022-04-14 DIAGNOSIS — Z6833 Body mass index (BMI) 33.0-33.9, adult: Secondary | ICD-10-CM | POA: Diagnosis not present

## 2022-04-14 DIAGNOSIS — Z139 Encounter for screening, unspecified: Secondary | ICD-10-CM | POA: Diagnosis not present

## 2022-04-14 DIAGNOSIS — E785 Hyperlipidemia, unspecified: Secondary | ICD-10-CM | POA: Diagnosis not present

## 2022-04-14 DIAGNOSIS — Z0000 Encounter for general adult medical examination without abnormal findings: Secondary | ICD-10-CM | POA: Diagnosis not present

## 2022-04-14 DIAGNOSIS — Z9181 History of falling: Secondary | ICD-10-CM | POA: Diagnosis not present

## 2022-05-01 DIAGNOSIS — Z79899 Other long term (current) drug therapy: Secondary | ICD-10-CM | POA: Diagnosis not present

## 2022-05-01 DIAGNOSIS — M5432 Sciatica, left side: Secondary | ICD-10-CM | POA: Diagnosis not present

## 2022-05-10 DIAGNOSIS — I2510 Atherosclerotic heart disease of native coronary artery without angina pectoris: Secondary | ICD-10-CM | POA: Diagnosis not present

## 2022-05-10 DIAGNOSIS — J849 Interstitial pulmonary disease, unspecified: Secondary | ICD-10-CM | POA: Diagnosis not present

## 2022-05-10 DIAGNOSIS — I5032 Chronic diastolic (congestive) heart failure: Secondary | ICD-10-CM | POA: Diagnosis not present

## 2022-05-10 DIAGNOSIS — I272 Pulmonary hypertension, unspecified: Secondary | ICD-10-CM | POA: Diagnosis not present

## 2022-05-10 DIAGNOSIS — Z9981 Dependence on supplemental oxygen: Secondary | ICD-10-CM | POA: Diagnosis not present

## 2022-05-10 DIAGNOSIS — J8410 Pulmonary fibrosis, unspecified: Secondary | ICD-10-CM | POA: Diagnosis not present

## 2022-05-10 DIAGNOSIS — I110 Hypertensive heart disease with heart failure: Secondary | ICD-10-CM | POA: Diagnosis not present

## 2022-05-12 NOTE — Telephone Encounter (Signed)
Patient received first shipment of [name] DPI on 04/11/22. Received second shipment on 05/09/22 of [name] DPI 69mg four times daily maintenance dose.  DKnox Saliva PharmD, MPH, BCPS, CPP Clinical Pharmacist (Rheumatology and Pulmonology)

## 2022-05-20 DIAGNOSIS — G8929 Other chronic pain: Secondary | ICD-10-CM | POA: Diagnosis not present

## 2022-05-20 DIAGNOSIS — M549 Dorsalgia, unspecified: Secondary | ICD-10-CM | POA: Diagnosis not present

## 2022-06-29 DIAGNOSIS — J9690 Respiratory failure, unspecified, unspecified whether with hypoxia or hypercapnia: Secondary | ICD-10-CM | POA: Diagnosis not present

## 2022-06-29 DIAGNOSIS — I509 Heart failure, unspecified: Secondary | ICD-10-CM | POA: Diagnosis not present

## 2022-06-29 DIAGNOSIS — I110 Hypertensive heart disease with heart failure: Secondary | ICD-10-CM | POA: Diagnosis not present

## 2022-06-29 DIAGNOSIS — Z8249 Family history of ischemic heart disease and other diseases of the circulatory system: Secondary | ICD-10-CM | POA: Diagnosis not present

## 2022-06-29 DIAGNOSIS — I2721 Secondary pulmonary arterial hypertension: Secondary | ICD-10-CM | POA: Diagnosis not present

## 2022-06-29 DIAGNOSIS — Z9981 Dependence on supplemental oxygen: Secondary | ICD-10-CM | POA: Diagnosis not present

## 2022-06-29 DIAGNOSIS — E261 Secondary hyperaldosteronism: Secondary | ICD-10-CM | POA: Diagnosis not present

## 2022-06-29 DIAGNOSIS — E785 Hyperlipidemia, unspecified: Secondary | ICD-10-CM | POA: Diagnosis not present

## 2022-06-29 DIAGNOSIS — E039 Hypothyroidism, unspecified: Secondary | ICD-10-CM | POA: Diagnosis not present

## 2022-06-29 DIAGNOSIS — Z809 Family history of malignant neoplasm, unspecified: Secondary | ICD-10-CM | POA: Diagnosis not present

## 2022-06-29 DIAGNOSIS — Z880 Allergy status to penicillin: Secondary | ICD-10-CM | POA: Diagnosis not present

## 2022-06-29 DIAGNOSIS — J8410 Pulmonary fibrosis, unspecified: Secondary | ICD-10-CM | POA: Diagnosis not present

## 2022-06-29 DIAGNOSIS — F325 Major depressive disorder, single episode, in full remission: Secondary | ICD-10-CM | POA: Diagnosis not present

## 2022-06-29 DIAGNOSIS — Z885 Allergy status to narcotic agent status: Secondary | ICD-10-CM | POA: Diagnosis not present

## 2022-07-07 ENCOUNTER — Ambulatory Visit (INDEPENDENT_AMBULATORY_CARE_PROVIDER_SITE_OTHER): Payer: Medicare PPO | Admitting: Pulmonary Disease

## 2022-07-07 ENCOUNTER — Encounter: Payer: Self-pay | Admitting: Pulmonary Disease

## 2022-07-07 VITALS — BP 128/64 | HR 101 | Ht 65.0 in | Wt 197.0 lb

## 2022-07-07 DIAGNOSIS — I272 Pulmonary hypertension, unspecified: Secondary | ICD-10-CM | POA: Diagnosis not present

## 2022-07-07 DIAGNOSIS — J849 Interstitial pulmonary disease, unspecified: Secondary | ICD-10-CM

## 2022-07-07 DIAGNOSIS — Z5181 Encounter for therapeutic drug level monitoring: Secondary | ICD-10-CM

## 2022-07-07 NOTE — Patient Instructions (Signed)
We will get labs from your primary care office Continue current treatment Follow-up in 2 to 3 months with either in person or televisit

## 2022-07-07 NOTE — Progress Notes (Addendum)
Kristie [name]    [id number]    June 11, 1951  Primary Care Physician:Moon, Amy A, NP  Referring Physician: Lowella Dandy, NP [place],  [place] [address]  Problem list: CTD related interstitial lung disease. 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  Followed with Dr. Amil Amen and Marella Chimes at [facility] rheumatology for Sjogren's syndrome with positive ANA, SSA, sicca syndrome and positive rheumatoid factor. 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  History also notable for coronary artery disease with stent placement at [facility].  Echocardiogram at [facility] last year shows moderate pulmonary hypertension which improved on follow up echo.  Evaluated in end of Jan 2021 for elevated WBC count.  SARS-CoV-2 test was negative.  CT with changes of pneumonia and she was given doxycycline in the ED High-res CT scan does not show ongoing pneumonia but has fibrosis and probable UIP pattern  Underwent bronchoscopy on 02/26/2020 to rule out infection given elevated WBC count Given prednisone taper and started on azathioprine 50 mg and Bactrim on 3/21.  Azathioprine increased to 100 mg on 5/21 Has also been evaluated by oncology on 03/26/2020 for elevated WBC count thought to be secondary to inflammation secondary to autoimmune disease.  Has followed up with Dr. Silas Flood and started on [name] since June 2022 She developed COVID-19 in September 2022.  Treated with oral antivirals and did not require hospitalization.  She was symptomatic only for 2 days and recovered after that  Referred to [facility] for transplant evaluation in early 2023 but was started on  Pets: No pets  Occupation: Retired  Recruitment consultant Exposures: No known exposures.  No mold, hot tub, Jacuzzi.  No down pillows or comforter Smoking history: Never smoked Travel history: No significant travel history Relevant family history: No significant family history of lung  Interim History: Continues on Imuran, Esbriet She is stable so far.  Tolerating the medications well.  Has chronic dyspnea on exertion On supplemental oxygen 5 L at rest and 15 L with exertion  [name] changed to MDI [name] changed to MDI last month.  She is tolerating this well  Outpatient Encounter Medications as of 07/07/2022  Medication Sig   albuterol (PROVENTIL) (2.5 MG/3ML) 0.083% nebulizer solution Take 3 mLs (2.5 mg total) by nebulization every 4 (four) hours as needed for wheezing or shortness of breath.   albuterol (VENTOLIN HFA) 108 (90 Base) MCG/ACT inhaler Inhale 2 puffs into the lungs every 4 (four) hours as needed for wheezing or shortness of breath.   amLODIPine-Valsartan-HCTZ 10-160-12.5 MG TABS    Ascorbic Acid (VITAMIN C) 100 MG tablet Take 100 mg by mouth daily.   aspirin EC 81 MG tablet Take 81 mg by mouth daily.   atorvastatin (LIPITOR) 80 MG tablet Take 80 mg by mouth daily.    azaTHIOprine (IMURAN) 50 MG tablet Take 3 tablets (150 mg total) by mouth daily.   budesonide-formoterol (SYMBICORT) 160-4.5 MCG/ACT inhaler Inhale 2 puffs into the lungs 2 (two) times daily.   Calcium 250 MG CAPS Take 500 mg by mouth 2 (two) times daily.   citalopram (CELEXA) 20 MG tablet Take 20 mg by mouth daily.   furosemide (LASIX)  20 MG tablet Take 1 tablet (20 mg total) by mouth daily.   gabapentin (NEURONTIN) 100 MG capsule    hydrOXYzine (ATARAX/VISTARIL) 25 MG tablet Take 25 mg by mouth at bedtime.   levothyroxine (SYNTHROID) 100 MCG tablet Take 100 mcg by mouth daily before breakfast.   loratadine-pseudoephedrine (CLARITIN-D 24-HOUR) 10-240 MG 24 hr tablet Take 1 tablet by mouth daily.   Magnesium 250 MG TABS Take 1 tablet by mouth at bedtime.    montelukast (SINGULAIR) 10 MG tablet Take 10 mg by mouth daily.    Multiple Vitamin (MULTIVITAMIN WITH MINERALS) TABS tablet Take 1 tablet by mouth daily.   ondansetron (ZOFRAN ODT) 4 MG disintegrating tablet Take 1 tablet (4 mg total) by mouth every 8 (eight) hours as needed for nausea or vomiting.   pantoprazole (PROTONIX) 40 MG tablet TAKE 1 TABLET BY MOUTH DAILY 30 TO 60 MINUTES BEFORE FIRST MEAL OF THE DAY   pimecrolimus (ELIDEL) 1 % cream Apply 1 application topically 2 (two) times daily.   Pirfenidone (ESBRIET) 267 MG TABS Take 1 tab three times daily for 7 days, then 2 tabs three times daily for 7 days, then 3 tabs three times daily thereafter.   Pirfenidone (ESBRIET) 267 MG TABS Take 3 tablets (801 mg total) by mouth 3 (three) times daily with meals.   ranolazine (RANEXA) 500 MG 12 hr tablet    sulfamethoxazole-trimethoprim (BACTRIM DS) 800-160 MG tablet TAKE 1 TABLET BY MOUTH 3 TIMES A WEEK   tiZANidine (ZANAFLEX) 4 MG capsule Take 1 capsule (4 mg total) by mouth 3 (three) times daily. (Patient taking differently: Take 4 mg by mouth 3 (three) times daily as needed.)   Treprostinil ([name] DPI MAINTENANCE KIT) 64 MCG POWD Inhale 64 mcg into the lungs in the morning, at noon, in the evening, and at bedtime.   No facility-administered encounter medications on file as of 07/07/2022.   Physical Exam: Blood pressure 128/64, pulse (!) 101, height _0  (1.651 m), weight 197 lb (89.4 kg), SpO2 95 %. Gen:      No acute distress HEENT:  EOMI, sclera anicteric Neck:     No masses; no thyromegaly Lungs:    Bibasal crackles CV:         Regular rate and rhythm; no murmurs Abd:      + bowel sounds; soft, non-tender; no palpable masses, no distension Ext:    No edema; adequate peripheral perfusion Skin:      Warm and dry; no rash Neuro: alert and oriented x 3 Psych: normal mood and affect   Data Reviewed: Imaging: CT high-resolution 07/24/2019-patchy groundglass with septal thickening,  reticulation, bronchiectasis with no honeycombing.  Basal gradient noted.  Probable UIP pattern  CTA 01/10/2020-No PE, mild bibasal consolidation.  Chronic ILD.  CT high-resolution 02/05/2020-pulmonary fibrosis and probable UIP pattern without progression since 2020.  CT high-resolution 01/16/2022-progressive interstitial lung disease and probable UIP pattern, mild cardiomegaly, aortic and coronary atherosclerosis I have reviewed the images personally.  PFTs:  03/09/2020 FVC 1.74 (54%), FEV1 1.51 [61%],/57, TLC 2.75 [52%], DLCO 8.44 [41%] Severe restriction and diffusion defect.  08/03/2020 FVC 1.74 [54%], FEV1 1.61 [6 6%], F/F 93 TLC 2.75 [53%], DLCO 9.52 [47%] Severe restriction and diffusion defect with improvement in DLCO compared to prior  6-minute walk test 02/06/2020-374 m, nadir O2 sat 94%  Labs: CBC 05/29/2019-WBC 13.2, eos 6.9%, absolute eosinophil count 911 CBC 03/26/2020 - WBC 14.8 eos 8%, absolute eosinophil count 1184  ANA 05/29/2019- > 1:1280 Repeat CTD serologies  01/09/2020-ANA 1 is to 1280, rheumatoid factor 14, positive SSA  SARS-CoV-2 01/10/2020- negative  Bronchoscopy 02/26/2020- Cell count 113, 42% lymphs Microbiology, cytology -negative  Cardiac: Echocardiogram ([facility]) 04/18/2019 Normal LV size, LVEF 41-79%, grade 1 diastolic dysfunction.  RVSP of 40-50 consistent with moderate pulmonary  Echocardiogram 01/12/2020 Normal LVEF 19-95%, grade 1 diastolic dysfunction, RVSP 35  Assessment:  CTD related interstitial lung disease, pulmonary fibrosis Sjogren's syndrome Continue on Imuran 150 mg and Esbriet started for progressive pulmonary fibrosis. We can consider alternative immunosuppressant such as CellCept or Rituxan but I do want to start multiple medications at the same time.   Not a candidate for lung transplant at [facility]  She recently had labs last month at her primary care.  We will obtain these for our record  Pulmonary  hypertension On [name] MDI. proBNP earlier this year was normal We will check with pharmacy if she can transition to [name] MDI  Goals of care We discussed goals of care.  She confirms DNR status and is currently being seen by palliative care at home.   Plan/Recommendations: - Continue Imuran, Esbriet - [name] MDI - Obtain labs from primary - Palliative care, DNR  Marshell Garfinkel MD [facility] 07/07/2022, 1:38 PM  CC: Moon, Amy A, NP  Addendum: Received labs from primary care dated 05/01/2022 CBC-WBC 9, hemoglobin 12.7, platelets 468, eos 6%, absolute eosinophil count 540  Complaints metabolic panel significant only for potassium of 3.4 and glucose 148.  Hepatic panel is normal

## 2022-07-10 DIAGNOSIS — J849 Interstitial pulmonary disease, unspecified: Secondary | ICD-10-CM | POA: Diagnosis not present

## 2022-07-10 DIAGNOSIS — Z9981 Dependence on supplemental oxygen: Secondary | ICD-10-CM | POA: Diagnosis not present

## 2022-07-10 DIAGNOSIS — J8410 Pulmonary fibrosis, unspecified: Secondary | ICD-10-CM | POA: Diagnosis not present

## 2022-08-01 ENCOUNTER — Other Ambulatory Visit: Payer: Self-pay | Admitting: Pulmonary Disease

## 2022-08-01 DIAGNOSIS — J849 Interstitial pulmonary disease, unspecified: Secondary | ICD-10-CM

## 2022-08-07 ENCOUNTER — Other Ambulatory Visit: Payer: Self-pay | Admitting: Pulmonary Disease

## 2022-08-07 DIAGNOSIS — J849 Interstitial pulmonary disease, unspecified: Secondary | ICD-10-CM

## 2022-08-07 NOTE — Telephone Encounter (Signed)
Refill sent for ESBRIET to [facility] ([facility]) for Esbriet: [phone]  Dose: 801 mg three times daily  Last OV: 07/07/22 Provider: Dr. Vaughan Browner  Next OV: 09/11/22  Labs from 05/01/22 wnl  Knox Saliva, PharmD, MPH, BCPS Clinical Pharmacist (Rheumatology and Pulmonology)

## 2022-08-10 DIAGNOSIS — J849 Interstitial pulmonary disease, unspecified: Secondary | ICD-10-CM | POA: Diagnosis not present

## 2022-08-10 DIAGNOSIS — J8410 Pulmonary fibrosis, unspecified: Secondary | ICD-10-CM | POA: Diagnosis not present

## 2022-08-15 DIAGNOSIS — Z79899 Other long term (current) drug therapy: Secondary | ICD-10-CM | POA: Diagnosis not present

## 2022-09-09 DIAGNOSIS — I4819 Other persistent atrial fibrillation: Secondary | ICD-10-CM | POA: Diagnosis not present

## 2022-09-09 DIAGNOSIS — J449 Chronic obstructive pulmonary disease, unspecified: Secondary | ICD-10-CM | POA: Diagnosis not present

## 2022-09-11 ENCOUNTER — Telehealth (INDEPENDENT_AMBULATORY_CARE_PROVIDER_SITE_OTHER): Payer: Medicare PPO | Admitting: Pulmonary Disease

## 2022-09-11 ENCOUNTER — Ambulatory Visit: Payer: Medicare PPO | Admitting: Pulmonary Disease

## 2022-09-11 DIAGNOSIS — Z5181 Encounter for therapeutic drug level monitoring: Secondary | ICD-10-CM | POA: Diagnosis not present

## 2022-09-11 DIAGNOSIS — J849 Interstitial pulmonary disease, unspecified: Secondary | ICD-10-CM

## 2022-09-11 NOTE — Progress Notes (Signed)
Kristie [name]    [id number]    05-Feb-1951  Primary Care Physician:Moon, Amy A, NP  Referring Physician: Lowella Dandy, NP [place],   [id number]  Virtual Visit via Video Note  I connected with Kristie [name] on 09/12/22 at 10:45 AM EDT by a video enabled telemedicine application and verified that I am speaking with the correct person using two identifiers.  Location: Patient: Home Provider: [address]   I discussed the limitations of evaluation and management by telemedicine and the availability of in person appointments. The patient expressed understanding and agreed to proceed.  Problem list: CTD related interstitial lung disease. 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  Followed with Dr. Amil Amen and Marella Chimes at [facility] rheumatology for Sjogren's syndrome with positive ANA, SSA, sicca syndrome and positive rheumatoid factor. 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  History also notable for coronary artery disease with stent placement at [facility].  Echocardiogram at [facility] last year shows moderate pulmonary hypertension which improved on follow up echo.  Evaluated in end of Jan 2021 for elevated WBC count.  SARS-CoV-2 test was negative.  CT with changes of pneumonia and she was given doxycycline in the ED High-res CT scan does not show ongoing pneumonia but has fibrosis and probable UIP pattern  Underwent bronchoscopy on 02/26/2020 to rule out infection given elevated WBC count Given prednisone taper and started on azathioprine 50 mg and Bactrim on 3/21.  Azathioprine increased to 100 mg on 5/21 Has also been evaluated by oncology on 03/26/2020 for  elevated WBC count thought to be secondary to inflammation secondary to autoimmune disease.  Has followed up with Dr. Silas Flood and started on [name] since June 2022 She developed COVID-19 in September 2022.  Treated with oral antivirals and did not require hospitalization.  She was symptomatic only for 2 days and recovered after that  Referred to [facility] for transplant evaluation in early 2023 but was started on  Pets: No pets  Occupation: Retired [employer] consultant Exposures: No known exposures.  No mold, hot tub, Jacuzzi.  No down pillows or comforter Smoking history: Never smoked Travel history: No significant travel history Relevant family history: No significant family history of lung  Interim History: Continues on Imuran, Esbriet and [name] She is stable so far.  Tolerating the medications well.  Has chronic dyspnea on exertion which she feels has improved since last visit On supplemental oxygen 5 L at rest and 15 L with exertion   Outpatient Encounter Medications as of 09/11/2022  Medication Sig   albuterol (PROVENTIL) (2.5 MG/3ML) 0.083% nebulizer solution Take 3 mLs (2.5 mg total) by nebulization every 4 (four) hours as needed for wheezing or shortness of breath.   albuterol (VENTOLIN HFA) 108 (90 Base) MCG/ACT inhaler Inhale 2 puffs into the lungs every 4 (four) hours as needed for wheezing or shortness of breath.   amLODIPine-Valsartan-HCTZ 10-160-12.5 MG TABS    Ascorbic Acid (VITAMIN C) 100 MG tablet Take 100 mg by mouth daily.   aspirin EC 81 MG tablet Take 81 mg by mouth daily.   atorvastatin (LIPITOR) 80 MG tablet Take 80 mg by  mouth daily.    azaTHIOprine (IMURAN) 50 MG tablet TAKE 3 TABLETS(150 MG) BY MOUTH DAILY   baclofen (LIORESAL) 10 MG tablet Take 10 mg by mouth 3 (three) times daily as needed.   citalopram (CELEXA) 20 MG tablet Take 20 mg by mouth daily.   furosemide (LASIX) 20 MG tablet Take 1 tablet (20 mg total) by mouth daily.   gabapentin (NEURONTIN) 100 MG capsule     hydrOXYzine (ATARAX/VISTARIL) 25 MG tablet Take 25 mg by mouth at bedtime.   levothyroxine (SYNTHROID) 100 MCG tablet Take 100 mcg by mouth daily before breakfast.   loratadine-pseudoephedrine (CLARITIN-D 24-HOUR) 10-240 MG 24 hr tablet Take 1 tablet by mouth daily.   Magnesium 250 MG TABS Take 1 tablet by mouth at bedtime.   montelukast (SINGULAIR) 10 MG tablet Take 10 mg by mouth daily.    Multiple Vitamin (MULTIVITAMIN WITH MINERALS) TABS tablet Take 1 tablet by mouth daily.   pantoprazole (PROTONIX) 40 MG tablet TAKE 1 TABLET BY MOUTH DAILY 30 TO 60 MINUTES BEFORE FIRST MEAL OF THE DAY   pimecrolimus (ELIDEL) 1 % cream Apply 1 application topically 2 (two) times daily.   Pirfenidone (ESBRIET) 801 MG TABS Take 1 tablet by mouth 3 (three) times daily with meals.   ranolazine (RANEXA) 500 MG 12 hr tablet    sulfamethoxazole-trimethoprim (BACTRIM DS) 800-160 MG tablet TAKE 1 TABLET BY MOUTH 3 TIMES A WEEK   Treprostinil ([name] DPI MAINTENANCE KIT) 64 MCG POWD Inhale 64 mcg into the lungs in the morning, at noon, in the evening, and at bedtime.   budesonide-formoterol (SYMBICORT) 160-4.5 MCG/ACT inhaler Inhale 2 puffs into the lungs 2 (two) times daily. (Patient not taking: Reported on 09/11/2022)   Calcium 250 MG CAPS Take 500 mg by mouth 2 (two) times daily. (Patient not taking: Reported on 09/11/2022)   ondansetron (ZOFRAN ODT) 4 MG disintegrating tablet Take 1 tablet (4 mg total) by mouth every 8 (eight) hours as needed for nausea or vomiting. (Patient not taking: Reported on 09/11/2022)   No facility-administered encounter medications on file as of 09/11/2022.   Physical Exam: There were no vitals taken for this visit. Gen:      No acute distress HEENT:  EOMI, sclera anicteric Neck:     No masses; no thyromegaly Lungs:    Clear to auscultation bilaterally; normal respiratory effort CV:         Regular rate and rhythm; no murmurs Abd:      + bowel sounds; soft, non-tender; no palpable  masses, no distension Ext:    No edema; adequate peripheral perfusion Skin:      Warm and dry; no rash Neuro: alert and oriented x 3 Psych: normal mood and affect   Data Reviewed: Imaging: CT high-resolution 07/24/2019-patchy groundglass with septal thickening, reticulation, bronchiectasis with no honeycombing.  Basal gradient noted.  Probable UIP pattern  CTA 01/10/2020-No PE, mild bibasal consolidation.  Chronic ILD.  CT high-resolution 02/05/2020-pulmonary fibrosis and probable UIP pattern without progression since 2020.  CT high-resolution 01/16/2022-progressive interstitial lung disease and probable UIP pattern, mild cardiomegaly, aortic and coronary atherosclerosis I have reviewed the images personally.  PFTs:  03/09/2020 FVC 1.74 (54%), FEV1 1.51 [61%],/57, TLC 2.75 [52%], DLCO 8.44 [41%] Severe restriction and diffusion defect.  08/03/2020 FVC 1.74 [54%], FEV1 1.61 [6 6%], F/F 93 TLC 2.75 [53%], DLCO 9.52 [47%] Severe restriction and diffusion defect with improvement in DLCO compared to prior  6-minute walk test 02/06/2020-374 m, nadir O2 sat 94%  Labs:  CBC 05/29/2019-WBC 13.2, eos 6.9%, absolute eosinophil count 911 CBC 03/26/2020 - WBC 14.8 eos 8%, absolute eosinophil count 1184  ANA 05/29/2019- > 1:1280 Repeat CTD serologies 01/09/2020-ANA 1 is to 1280, rheumatoid factor 14, positive SSA  SARS-CoV-2 01/10/2020- negative  Bronchoscopy 02/26/2020- Cell count 113, 42% lymphs Microbiology, cytology -negative  Received labs from primary care dated 05/01/2022 CBC-WBC 9, hemoglobin 12.7, platelets 468, eos 6%, absolute eosinophil count 540  Complaints metabolic panel significant only for potassium of 3.4 and glucose 148.  Hepatic panel is normal  Cardiac: Echocardiogram ([facility]) 04/18/2019 Normal LV size, LVEF 79-49%, grade 1 diastolic dysfunction.  RVSP of 40-50 consistent with moderate pulmonary  Echocardiogram 01/12/2020 Normal LVEF 97-18%, grade 1 diastolic dysfunction,  RVSP 35  Assessment:  CTD related interstitial lung disease, pulmonary fibrosis Sjogren's syndrome Continue on Imuran 150 mg and Esbriet started for progressive pulmonary fibrosis. We can consider alternative immunosuppressant such as CellCept or Rituxan but I do want to start multiple medications at the same time.   Not a candidate for lung transplant at [place] [facility]  She recently had labs last month at her primary care.  We will obtain these for our record  Pulmonary hypertension On [name] DPI. proBNP earlier this year was normal  Goals of care We discussed goals of care.  She confirms DNR status and is currently being seen by palliative care at home.  Plan/Recommendations: - Continue Imuran, Esbriet - [name] MDI - Obtain labs from primary - Palliative care, DNR   I discussed the assessment and treatment plan with the patient. The patient was provided an opportunity to ask questions and all were answered. The patient agreed with the plan and demonstrated an understanding of the instructions.   The patient was advised to call back or seek an in-person evaluation if the symptoms worsen or if the condition fails to improve as anticipated.  Marshell Garfinkel MD [facility] 09/11/2022, 10:45 AM  CC: Lowella Dandy, NP

## 2022-09-11 NOTE — Patient Instructions (Signed)
Glad you are doing well with regard to breathing Continue current therapy We will get labs from your primary care for review Get high-res CT scan and PFTs in 3 to 4 months Return to clinic after these tests.

## 2022-09-14 DIAGNOSIS — I272 Pulmonary hypertension, unspecified: Secondary | ICD-10-CM | POA: Diagnosis not present

## 2022-09-14 DIAGNOSIS — M3501 Sicca syndrome with keratoconjunctivitis: Secondary | ICD-10-CM | POA: Diagnosis not present

## 2022-09-14 DIAGNOSIS — J849 Interstitial pulmonary disease, unspecified: Secondary | ICD-10-CM | POA: Diagnosis not present

## 2022-09-14 DIAGNOSIS — G894 Chronic pain syndrome: Secondary | ICD-10-CM | POA: Diagnosis not present

## 2022-09-14 DIAGNOSIS — M797 Fibromyalgia: Secondary | ICD-10-CM | POA: Diagnosis not present

## 2022-09-14 DIAGNOSIS — D72829 Elevated white blood cell count, unspecified: Secondary | ICD-10-CM | POA: Diagnosis not present

## 2022-09-27 ENCOUNTER — Telehealth: Payer: Self-pay

## 2022-09-27 NOTE — Patient Outreach (Signed)
  Care Coordination   Initial Visit Note   09/27/2022 Name: Kristie [name] MRN: [id number] DOB: 06-18-1951  Kristie [name] is a 71 y.o. year old female who sees Moon, Amy A, NP for primary care. I spoke with  Therisa Doyne by phone today.  What matters to the patients health and wellness today?  Placed call to patient to review and offer [facility] care coordination program.  Patient reports that she is doing well, Reports she has good support with her family.  Reviewed with patient to notify MD if she needs assistance in the future.     SDOH assessments and interventions completed:  No     Care Coordination Interventions Activated:  No  Care Coordination Interventions:  No, not indicated   Follow up plan: No further intervention required.   Encounter Outcome:  Pt. Refused   Tomasa Rand, RN, BSN, CEN [facility] [employer] [phone]

## 2022-10-03 ENCOUNTER — Other Ambulatory Visit: Payer: Self-pay | Admitting: Primary Care

## 2022-10-10 DIAGNOSIS — J8410 Pulmonary fibrosis, unspecified: Secondary | ICD-10-CM | POA: Diagnosis not present

## 2022-10-12 ENCOUNTER — Other Ambulatory Visit: Payer: Self-pay | Admitting: Pulmonary Disease

## 2022-11-01 ENCOUNTER — Telehealth: Payer: Self-pay | Admitting: Pulmonary Disease

## 2022-11-01 NOTE — Telephone Encounter (Signed)
Called and spoke with pt who states that her O2 sats have been dropping to the 70s on her 12L O2. Stated to pt that she needed to go to the ED to be evaluated and she verbalized understanding. Nothing further needed.

## 2022-11-02 ENCOUNTER — Other Ambulatory Visit: Payer: Self-pay | Admitting: Pulmonary Disease

## 2022-11-02 DIAGNOSIS — J849 Interstitial pulmonary disease, unspecified: Secondary | ICD-10-CM

## 2022-11-09 ENCOUNTER — Other Ambulatory Visit: Payer: Self-pay | Admitting: *Deleted

## 2022-11-09 DIAGNOSIS — J8410 Pulmonary fibrosis, unspecified: Secondary | ICD-10-CM | POA: Diagnosis not present

## 2022-11-09 DIAGNOSIS — Z9981 Dependence on supplemental oxygen: Secondary | ICD-10-CM | POA: Diagnosis not present

## 2022-11-09 DIAGNOSIS — J849 Interstitial pulmonary disease, unspecified: Secondary | ICD-10-CM | POA: Diagnosis not present

## 2022-11-09 MED ORDER — PANTOPRAZOLE SODIUM 40 MG PO TBEC: DELAYED_RELEASE_TABLET | ORAL | 3 refills | Status: DC

## 2022-11-13 ENCOUNTER — Other Ambulatory Visit: Payer: Self-pay | Admitting: Pulmonary Disease

## 2022-11-13 DIAGNOSIS — Z5181 Encounter for therapeutic drug level monitoring: Secondary | ICD-10-CM

## 2022-11-13 DIAGNOSIS — J849 Interstitial pulmonary disease, unspecified: Secondary | ICD-10-CM

## 2022-11-13 NOTE — Telephone Encounter (Signed)
Refill sent for PIRFENIDONE to [facility]: [phone]  Dose: 801 mg three times daily  Last OV: 07/07/22 Provider: Dr. Vaughan Browner  Next OV: 11/14/22  OVERDUE FOR LFT monitoring. Called patient and advised to have CMET completed tomorrow after OV  Knox Saliva, PharmD, MPH, BCPS Clinical Pharmacist (Rheumatology and Pulmonology)

## 2022-11-14 ENCOUNTER — Ambulatory Visit (INDEPENDENT_AMBULATORY_CARE_PROVIDER_SITE_OTHER): Payer: Medicare PPO | Admitting: Pulmonary Disease

## 2022-11-14 ENCOUNTER — Ambulatory Visit (INDEPENDENT_AMBULATORY_CARE_PROVIDER_SITE_OTHER): Payer: Medicare PPO

## 2022-11-14 ENCOUNTER — Encounter: Payer: Self-pay | Admitting: Pulmonary Disease

## 2022-11-14 VITALS — BP 134/74 | HR 92 | Wt 191.0 lb

## 2022-11-14 DIAGNOSIS — J849 Interstitial pulmonary disease, unspecified: Secondary | ICD-10-CM

## 2022-11-14 DIAGNOSIS — Z5181 Encounter for therapeutic drug level monitoring: Secondary | ICD-10-CM

## 2022-11-14 DIAGNOSIS — R059 Cough, unspecified: Secondary | ICD-10-CM | POA: Diagnosis not present

## 2022-11-14 DIAGNOSIS — F330 Major depressive disorder, recurrent, mild: Secondary | ICD-10-CM | POA: Diagnosis not present

## 2022-11-14 DIAGNOSIS — Z7189 Other specified counseling: Secondary | ICD-10-CM | POA: Diagnosis not present

## 2022-11-14 DIAGNOSIS — J9611 Chronic respiratory failure with hypoxia: Secondary | ICD-10-CM | POA: Diagnosis not present

## 2022-11-14 DIAGNOSIS — R052 Subacute cough: Secondary | ICD-10-CM

## 2022-11-14 LAB — COMPREHENSIVE METABOLIC PANEL
ALT: 16 U/L (ref 0–35)
AST: 23 U/L (ref 0–37)
Albumin: 3.9 g/dL (ref 3.5–5.2)
Alkaline Phosphatase: 85 U/L (ref 39–117)
BUN: 13 mg/dL (ref 6–23)
CO2: 29 mEq/L (ref 19–32)
Calcium: 8.8 mg/dL (ref 8.4–10.5)
Chloride: 105 mEq/L (ref 96–112)
Creatinine, Ser: 0.81 mg/dL (ref 0.40–1.20)
GFR: 73.64 mL/min (ref >60.00)
Glucose, Bld: 91 mg/dL (ref 70–99)
Potassium: 3.7 mEq/L (ref 3.5–5.1)
Sodium: 142 mEq/L (ref 135–145)
Total Bilirubin: 0.3 mg/dL (ref 0.2–1.2)
Total Protein: 6.4 g/dL (ref 6.0–8.3)

## 2022-11-14 MED ORDER — PREDNISONE 10 MG PO TABS: ORAL_TABLET | ORAL | 0 refills | Status: AC

## 2022-11-14 MED ORDER — AZITHROMYCIN 250 MG PO TABS: ORAL_TABLET | ORAL | 0 refills | Status: DC

## 2022-11-14 NOTE — Patient Instructions (Signed)
Nice to see you again  I am sorry you are not feeling well  I recommend taking prednisone 40 mg for 5 days, then 20 mg for 5 days, then 10 mg for 5 days, then stop  I sent a prescription for azithromycin, 2 tablets day 1 then 1 tablet days 2 through 5  We will get a chest x-ray today, a little more crackly on the right when I listen to you  We will get blood work today to monitor your liver  Return to clinic as scheduled with Dr. Vaughan Browner

## 2022-11-14 NOTE — Progress Notes (Signed)
_0  ID: Kristie [name], female    DOB: 1951/09/28, 71 y.o.   MRN: [id number]  Chief Complaint  Patient presents with   Follow-up    Pt states she feels like she has bronchitis. She has been coughing a lot. Home Nurse states she has crackles in both lungs. So she wanted to come in and get checked out. She states something about her needing blood work today?    Referring provider: Lowella Dandy, NP  HPI:   71 y.o. with history of ILD and group 3 pulmonary hypertension on [name] who is more closely been following with Dr. Vaughan Browner given worsening of ILD, poss related to COVID infection, over the last several months whom we are seeing for acute visit related to ongoing cough and congestion.  Most recent pulmonary note x3 from Dr. Vaughan Browner reviewed.  Progressive worsening hypoxemia.  Now up to 10 to 12 L nasal cannula.  Overall stable over the last few months.  Over the last 3 weeks or so she reports increasing chest congestion.  Some cough.  Productive sputum.  She feels like prolonged bronchitis.  States that different providers have noted some crackles in bilateral lungs.  She is not any more hypoxemic than she had been in the months the weeks preceding the symptoms.  No more hypoxemic at rest nor with exertion.  Husband is present during the evaluation and confirms this.  Most recent pulmonary clinic visit with myself 71/2023: Overall, gradual decline following COVID infection 08/2021.  Had initial improvement with symptoms while started [name] 06/2021.  Contracted COVID 08/2021 and had worsening hypoxemia.  Chest x-ray demonstrated bilateral infiltrates worse compared to prior.  These have persisted on subsequent chest x-rays.  Her oxygen need has increased.  Desaturated at home when she is not turning up her stationary oxygen went up and exerting herself.  Discussed at length just turning the oxygen concentrator has to go, 5 L currently.  She was walked in the office today and maintain saturations at  88% on 4 L via her her POC.  Advise she does keep the oxygen at 5 L to help protect her when she moves around as she is not routinely increasing her oxygen to concentrator at home when she exerts herself.  We reviewed in depth her recent CT high-res performed at [facility].  Results are viewable in the portal, not via epic.  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  There is some confusion on her current dose of Imuran.  We have documented 150 mg daily, they think she is taking 100 mg daily.  We had a lengthy and frank conversation regarding goals of care.  Given her decline, suspect she will continue to worsen.  She now is requiring 4 to 5 L with exertion and likely will increase in the future.  This is despite aggressive immunosuppressants.  Given the timing of her symptoms fear this reflects a component of post COVID fibrotic changes although certainly at risk for progression of her underlying ILD as well.  I recommended that if she were to get sicker any more oxygen that is okay but I counseled her that she should not go on the ventilator, I recommended a CODE STATUS of DNR if she were to be admitted to the hospital.  She expressed understanding.  She is is to discuss with her husband moving forward with her wishes would  be.  In addition, I introduced the topic of hospice.  I think she qualifies for hospice now although it does not seem she is ready for the services, wishes for ongoing aggressive treatment of her underlying pulmonary issues.  I said at some point we may reach a tipping point where there is not much more we can do to help with her lung disease.  And that she may reach this point sooner than her doctors at some point if things are doing to her or causing more harm than good or more suffering or prolonging her discomfort that she should tell  us we should consider hospice services at that time.  Again, encouraged her and her husband to discuss her wishes and goals in the coming days and weeks so that we can best serve her.  HPI at initial visit: Patient has ongoing dyspnea on exertion.  Worsening over time.  Now minimal activity such as walking on flat surfaces make things worse.  Worse with inclines or stairs.  No timing during the day were things are better or worse.  No seasonal or environmental changes that she can identify with things are better or worse.  No alleviating or exacerbating factors.  Review of records indicates 07/2019 CT high-res will review shows signs consistent with UIP on my evaluation.  Repeat high-res CT 01/2020 with stable findings my interpretation.  Most recent high-res CT 2/22 2 with mild progression of peripheral fibrosis without frank honeycombing on my interpretation.  TTE 01/2020 reviewed with normal RV function, RA size, mildly elevated PASP with mild mitral valve and aortic regurgitation.  Most recent TTE 06/8937 with diastolic dysfunction, mitral valve regurgitation, mild to moderate aortic valve regurgitation severely dilated RA, elevated right ultra pressure of 8, elevated PASP, enlarged RV with normal function all suggestive worsening pressure overload.  PMH: ILD, Sjogren's, connective tissue disease Surgical history: Spine surgery, gastric band and gastric sleeve resection Family history: Mother and father with CAD, sister with diabetes and CAD, brother with CAD Social history: She is a never smoker, lives with husband in [place] / Pulmonary Flowsheets:   ACT:      No data to display          MMRC: mMRC Dyspnea Scale mMRC Score  11/22/2020 12:13 PM 3  02/06/2020 10:41 AM 2    Epworth:      No data to display          Tests:   FENO:  No results found for: "NITRICOXIDE"  PFT:    Latest Ref Rng & Units 08/03/2020    2:00 PM 03/09/2020   12:50 PM  PFT Results   FVC-Pre L 1.59  1.75   FVC-Predicted Pre % 50  54   FVC-Post L 1.74  1.74   FVC-Predicted Post % 54  54   Pre FEV1/FVC % % 95  76   Post FEV1/FCV % % 93  87   FEV1-Pre L 1.52  1.32   FEV1-Predicted Pre % 62  53   FEV1-Post L 1.61  1.51   DLCO uncorrected ml/min/mmHg 9.52  8.44   DLCO UNC% % 47  41   DLCO corrected ml/min/mmHg 9.52  9.01   DLCO COR %Predicted % 47  44   DLVA Predicted % 89  86   TLC L 2.75  2.75   TLC % Predicted % 53  52   RV % Predicted % 31  45   Personally reviewed and interpreted as spirometry  suggestive of severe restriction, no significant bronchodilator response, TLC with moderate restriction 53% of predicted, DLCO severely reduced.  Combination of findings suggestive of parenchymal restrictive process.  WALK:     02/06/2020   10:24 AM 09/18/2019    2:23 PM 07/02/2019    2:24 PM 05/29/2019   11:28 AM  SIX MIN WALK  Medications albuterol neb sol 0.083%, atorvastatin 73m, famotidine 262m levothyroxine 12510m metoprolol 80m19montelukast 10mg51mrphine 15mg,56mran 4mg, p55moprazole 40mg at73m5am     Supplimental Oxygen during Test? (L/min) No No  No  Laps 11     Partial Lap (in Meters) 0 meters     Baseline BP (sitting) 130/78     Baseline Heartrate 84     Baseline Dyspnea (Borg Scale) 4     Baseline Fatigue (Borg Scale) 3     Baseline SPO2 99 %     BP (sitting) 166/88     Heartrate 116     Dyspnea (Borg Scale) 8     Fatigue (Borg Scale) 5     SPO2 94 %     BP (sitting) 160/82     Heartrate 86     SPO2 97 %     Stopped or Paused before Six Minutes No     Interpretation Hip pain;Calf pain     Distance Completed 374 meters     Tech Comments: Pt walked at an average pace completing the entire 6 min without stopping. Pt did stagger some during the walk and also coughed throughout the walk. pt walked a very slow pace for approx 20 steps and stopped 3 x due to SOB and back pain//lmr average pace/SOB//lmr average pace/SOB//lmr    Imaging: Personally  reviewed and as per EMR  Lab Results: Personally reviewed, notably eosinophils as high as 700 12/2020 CBC    Component Value Date/Time   WBC 13.0 (H) 01/05/2021 1212   RBC 3.59 (L) 01/05/2021 1212   HGB 10.9 (L) 01/05/2021 1212   HGB 13.2 03/26/2020 1425   HCT 33.5 (L) 01/05/2021 1212   PLT 453.0 (H) 01/05/2021 1212   PLT 431 (H) 03/26/2020 1425   MCV 93.6 01/05/2021 1212   MCH 29.1 03/26/2020 1425   MCHC 32.5 01/05/2021 1212   RDW 15.4 01/05/2021 1212   LYMPHSABS 1.9 01/05/2021 1212   MONOABS 1.1 (H) 01/05/2021 1212   EOSABS 0.7 01/05/2021 1212   BASOSABS 0.1 01/05/2021 1212    BMET    Component Value Date/Time   NA 136 11/22/2021 1428   K 4.2 11/22/2021 1428   CL 102 11/22/2021 1428   CO2 26 11/22/2021 1428   GLUCOSE 102 (H) 11/22/2021 1428   BUN 17 11/22/2021 1428   CREATININE 0.93 11/22/2021 1428   CREATININE 0.86 03/26/2020 1425   CALCIUM 9.4 11/22/2021 1428   GFRNONAA >60 03/26/2020 1425   GFRAA >60 03/26/2020 1425    BNP No results found for: "BNP"  ProBNP    Component Value Date/Time   PROBNP 87.0 11/22/2021 1428    Specialty Problems       Pulmonary Problems   Allergic rhinitis    Qualifier: Diagnosis of  By: Ellison Loanne Drillingn A       Chronic cough    Onset 2005 intermittent and evolved to chronic cough Jan 2020 on fosfamax -  D/c fosfamax and symb 160 05/29/2019 and max rx for reflux > resolved 07/02/2019       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 Jan 2020  - 05/29/2019   Walked RA  2 laps @  approx 247f each @ fast pace  stopped due to end of study c/o sob and sats down to 92%   - collagen vasc profile sent 05/29/2019 >  Pos for ANA with speckled pattern 1: 1280 and RA 19  - 07/02/2019   Walked RA  2 laps @  approx 2544feach @ avg pace  stopped due to  End of study with sats 91% at sob at end   - HRCT 07/24/2019 1. The appearance of the lungs remains compatible with interstitial lung  disease, with a spectrum of findings considered probable usual interstitial pneumonia (UIP) per current ATS guidelines. 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 - Rheum eval 07/16/19 by ErMarella ChimesA with multiple studies sent       DOE (dyspnea on exertion)    Onset around 2015 assoc with PF   09/18/2019   Walked RA x one lap =  approx 250 ft - stopped due to  Sob/back pain with sats 91% at end at very slow pace / freq stops      ILD (interstitial lung disease) ([facility]  Chronic respiratory failure with hypoxia (HCC)   Snoring    Allergies  Allergen Reactions   Penicillins Itching and Other (See Comments)    "Cillin" Family - Amoxicillin - itching    Did it involve swelling of the face/tongue/throat, SOB, or low BP? Yes Did it involve sudden or severe rash/hives, skin peeling, or any reaction on the inside of your mouth or nose? no Did you need to seek medical attention at a hospital or doctor's office? yes When did it last happen?      30 years If all above answers are "NO", may proceed with cephalosporin use.   Doxycycline Rash    Immunization History  Administered Date(s) Administered   Fluad Quad(high Dose 65+) 11/10/2020   Influenza, High Dose Seasonal PF 08/29/2019, 01/09/2022   Moderna Sars-Covid-2 Vaccination 01/01/2020, 01/26/2020, 11/03/2020    Past Medical History:  Diagnosis Date   Anxiety    Bronchitis    Fibromyalgia    Hypertension    PMH: 2010   Hypothyroidism    Seasonal allergies    Spinal stenosis    L-5 to S-1   Thyroid disease    Wears glasses     Tobacco History: Social History   Tobacco Use  Smoking Status Never  Smokeless Tobacco Never   Counseling given: Not Answered   Continue to not smoke  Outpatient Encounter Medications as of 11/14/2022  Medication Sig   albuterol (PROVENTIL) (2.5 MG/3ML) 0.083%  nebulizer solution TAKE 3MLS BY NEBULIZTION EVERY 4 HOURS AS NEEDED FOR WHEEZING OR SHORTNESS OF BREATH   albuterol (VENTOLIN HFA) 108 (90 Base) MCG/ACT inhaler Inhale 2 puffs into the lungs every 4 (four) hours as needed for wheezing or shortness of breath.   amLODIPine-Valsartan-HCTZ 10-160-12.5 MG TABS    Ascorbic Acid (VITAMIN C) 100 MG tablet Take 100 mg by mouth daily.   aspirin EC 81 MG tablet Take 81 mg by mouth daily.   atorvastatin (LIPITOR) 80 MG tablet Take 80 mg by mouth daily.    azaTHIOprine (IMURAN) 50 MG tablet TAKE 3 TABLETS(150 MG) BY MOUTH DAILY   azithromycin (ZITHROMAX) 250 MG tablet Take 2 tab day 1, then 1 tab  days 2 through 5   baclofen (LIORESAL) 10 MG tablet Take 10 mg by mouth 3 (three) times daily as needed.   budesonide-formoterol (SYMBICORT) 160-4.5 MCG/ACT inhaler Inhale 2 puffs into the lungs 2 (two) times daily.   Calcium 250 MG CAPS Take 500 mg by mouth 2 (two) times daily.   citalopram (CELEXA) 20 MG tablet Take 20 mg by mouth daily.   furosemide (LASIX) 20 MG tablet Take 1 tablet (20 mg total) by mouth daily.   gabapentin (NEURONTIN) 100 MG capsule    hydrOXYzine (ATARAX/VISTARIL) 25 MG tablet Take 25 mg by mouth at bedtime.   levothyroxine (SYNTHROID) 100 MCG tablet Take 100 mcg by mouth daily before breakfast.   loratadine-pseudoephedrine (CLARITIN-D 24-HOUR) 10-240 MG 24 hr tablet Take 1 tablet by mouth daily.   Magnesium 250 MG TABS Take 1 tablet by mouth at bedtime.   montelukast (SINGULAIR) 10 MG tablet Take 10 mg by mouth daily.    Multiple Vitamin (MULTIVITAMIN WITH MINERALS) TABS tablet Take 1 tablet by mouth daily.   ondansetron (ZOFRAN ODT) 4 MG disintegrating tablet Take 1 tablet (4 mg total) by mouth every 8 (eight) hours as needed for nausea or vomiting.   pantoprazole (PROTONIX) 40 MG tablet TAKE 1 TABLET BY MOUTH DAILY 30 TO 60 MINUTES BEFORE FIRST MEAL OF THE DAY   pimecrolimus (ELIDEL) 1 % cream Apply 1 application topically 2 (two) times  daily.   Pirfenidone (ESBRIET) 801 MG TABS Take 1 tablet by mouth 3 (three) times daily with meals.   predniSONE (DELTASONE) 10 MG tablet Take 4 tablets (40 mg total) by mouth daily with breakfast for 5 days, THEN 2 tablets (20 mg total) daily with breakfast for 5 days, THEN 1 tablet (10 mg total) daily with breakfast for 5 days.   ranolazine (RANEXA) 500 MG 12 hr tablet    sulfamethoxazole-trimethoprim (BACTRIM DS) 800-160 MG tablet TAKE 1 TABLET BY MOUTH 3 TIMES A WEEK   Treprostinil ([name] DPI MAINTENANCE KIT) 64 MCG POWD Inhale 64 mcg into the lungs in the morning, at noon, in the evening, and at bedtime.   No facility-administered encounter medications on file as of 11/14/2022.     Review of Systems  Review of Systems  N/A  Physical Exam  BP 134/74 (BP Location: Right Arm, Patient Position: Sitting, Cuff Size: Normal)   Pulse 92   Wt 191 lb (86.6 kg)   SpO2 97% Comment: 5 Liters via Nasal cannula  BMI 31.78 kg/m   Wt Readings from Last 5 Encounters:  11/14/22 191 lb (86.6 kg)  07/07/22 197 lb (89.4 kg)  03/31/22 198 lb (89.8 kg)  02/02/22 196 lb (88.9 kg)  01/23/22 192 lb (87.1 kg)    BMI Readings from Last 5 Encounters:  11/14/22 31.78 kg/m  07/07/22 32.78 kg/m  03/31/22 32.95 kg/m  02/02/22 32.62 kg/m  01/23/22 31.95 kg/m     Physical Exam General: Well-appearing, no acute distress Eyes: EOMI, no icterus Neck: Supple, no JVP Pulmonary: Very fine crackles in bilateral bases, otherwise clear, normal work of breathing on oxygen Cardiovascular: Tachycardic, regular rhythm, no murmurs Abdomen: Nondistended, bowel sounds present MSK: No synovitis, joint effusion Neuro: No weakness, sensation intact Psych: Normal mood, full affect   Assessment & Plan:   Bronchitis: Prolonged cough, sputum production, chest congestion.  Lung exam still stable, consistent with ILD with fine crackles, otherwise clear.  No worsening hypoxemia with illness.  Chest x-ray for  further evaluation.  Prednisone taper as well as Z-Pak  prescribed today.  Pulmonary hypertension: Suspect contributions largely from group 3 disease given hypoxemia, interstitial lung disease.  No OSA on polysomnography and no evidence of chronic PE on VQ scan.  Given significant group 3 disease and parenchymal disease, prescribed [name] 05/2021 in effort to minimize VQ mismatch.   Dry weight difficult to assess given she is starting to gain weight after significant diarrhea and malnutrition early summer 2022.  She has worsening dyspnea and hypoxemia since diagnosed with COVID early 07/2021.  Chest x-ray concerning for development of post viral scarring with subsequent CT chest 01/2022 demonstrating worsening fibrotic changes.  I had some concern for unmasking of PV OD.  However, she reports improvement on [name] and not worsening.  After COVID infection again had another improvement with up titration of [name] further reducing concern for PV OD.  Currently at goal dose [name]  DPI 64 micrograms 4 times daily.  Lab work demonstrated normal BN 11/2021 .  Chronic hypoxemic respite failure due to ILD: Medications management Dr. Vaughan Browner been on Esbriet and Imuran.  LFTs obtained today for intensive drug monitoring while on Esbriet.  No follow-ups on file.  Encouraged to keep scheduled follow-up with Dr. Vaughan Browner.   Lanier Clam, MD 11/14/2022   This appointment required 42 minutes of patient care (this includes precharting, chart review, review of results, face-to-face care, etc.).

## 2022-11-15 NOTE — Telephone Encounter (Signed)
LFTs from 11/14/2022 - wnl  Rx for pirfenidone sent to [facility] today.  Knox Saliva, PharmD, MPH, BCPS, CPP Clinical Pharmacist (Rheumatology and Pulmonology)

## 2022-11-16 ENCOUNTER — Telehealth: Payer: Self-pay | Admitting: Pulmonary Disease

## 2022-11-16 NOTE — Telephone Encounter (Signed)
Called patient and she states that she would like the results of her chest xray that she got on 12/5. And she would also like the results of lab work.   Please advise sir

## 2022-11-16 NOTE — Telephone Encounter (Signed)
Called and spoke to husband and patient and went over results with them. They verbalized understanding. Nothing further needed

## 2022-11-16 NOTE — Telephone Encounter (Signed)
Radiologist concerned about shadow in right lung - hard to know if pneumonia or just related to fibrosis since last chest xray I can compare to is December 2022. Either way I am glad we sent an antibiotic.  Labs are normal.

## 2022-11-24 DIAGNOSIS — J849 Interstitial pulmonary disease, unspecified: Secondary | ICD-10-CM | POA: Diagnosis not present

## 2022-11-24 DIAGNOSIS — Z79899 Other long term (current) drug therapy: Secondary | ICD-10-CM | POA: Diagnosis not present

## 2022-11-24 DIAGNOSIS — E785 Hyperlipidemia, unspecified: Secondary | ICD-10-CM | POA: Diagnosis not present

## 2022-11-24 DIAGNOSIS — I10 Essential (primary) hypertension: Secondary | ICD-10-CM | POA: Diagnosis not present

## 2022-11-24 DIAGNOSIS — I2510 Atherosclerotic heart disease of native coronary artery without angina pectoris: Secondary | ICD-10-CM | POA: Diagnosis not present

## 2022-11-24 DIAGNOSIS — M549 Dorsalgia, unspecified: Secondary | ICD-10-CM | POA: Diagnosis not present

## 2022-11-24 DIAGNOSIS — E876 Hypokalemia: Secondary | ICD-10-CM | POA: Diagnosis not present

## 2022-11-24 DIAGNOSIS — F330 Major depressive disorder, recurrent, mild: Secondary | ICD-10-CM | POA: Diagnosis not present

## 2022-11-24 DIAGNOSIS — G8929 Other chronic pain: Secondary | ICD-10-CM | POA: Diagnosis not present

## 2022-11-24 DIAGNOSIS — Z23 Encounter for immunization: Secondary | ICD-10-CM | POA: Diagnosis not present

## 2022-11-24 DIAGNOSIS — E039 Hypothyroidism, unspecified: Secondary | ICD-10-CM | POA: Diagnosis not present

## 2022-12-08 ENCOUNTER — Telehealth: Payer: Self-pay | Admitting: Pulmonary Disease

## 2022-12-08 MED ORDER — PREDNISONE 10 MG PO TABS: ORAL_TABLET | ORAL | 0 refills | Status: AC

## 2022-12-08 MED ORDER — AZITHROMYCIN 250 MG PO TABS: 250.0000 mg | ORAL_TABLET | Freq: Every day | ORAL | 0 refills | Status: DC

## 2022-12-08 NOTE — Telephone Encounter (Signed)
Called and spoke to patient and she states that she lives all the way is [place] and does not have the means to get here for an appointment. She verbalized understanding that if she completes this course of medication and is still having the cough and congestion then she has to come in for a follow up. Went over medications from Dr Verlee Monte. She verbalized understanding. Went over pharmacy with patient. Nothing further needed

## 2022-12-08 NOTE — Telephone Encounter (Signed)
Patient called to request a refill on her antibiotic and prednisone which she got a few weeks ago.  She stated that she still has a bad cough and congestion.  Please advise.

## 2022-12-08 NOTE — Telephone Encounter (Signed)
Would recommend coming in for appointment with APP or MD. If unwilling to come in for appointment then given drug allergies and bronchiectasis (though her bronchiectasis is more consistent with traction than primary airways disease) could try azithromycin 250 mg daily for 2 weeks, repeat 10 day prednisone taper.

## 2022-12-08 NOTE — Telephone Encounter (Signed)
Called and spoke to patient and she states that she is stilling having a deep and productive cough as before when she saw [place] on 11/14/2022. She was given:  prednisone 40 mg for 5 days, then 20 mg for 5 days, then 10 mg for 5 days, then stop   azithromycin, 2 tablets day 1 then 1 tablet days 2 through 5  She states the congestion is still bad. But when she was on the medication it did help her breath a lot easier. But now that she is finished with the medication is seems the cough is back with a vengeance she states.   No other symptoms noted from patient other then the cough and congestion. Mucus production is white and thick per patient.   Please advise sir since Dr Vaughan Browner is out and so is [place]

## 2022-12-17 DIAGNOSIS — J45909 Unspecified asthma, uncomplicated: Secondary | ICD-10-CM | POA: Diagnosis not present

## 2022-12-17 DIAGNOSIS — J849 Interstitial pulmonary disease, unspecified: Secondary | ICD-10-CM | POA: Diagnosis not present

## 2022-12-27 DIAGNOSIS — J849 Interstitial pulmonary disease, unspecified: Secondary | ICD-10-CM | POA: Diagnosis not present

## 2022-12-27 DIAGNOSIS — J45909 Unspecified asthma, uncomplicated: Secondary | ICD-10-CM | POA: Diagnosis not present

## 2023-01-05 ENCOUNTER — Encounter (INDEPENDENT_AMBULATORY_CARE_PROVIDER_SITE_OTHER): Payer: Medicare PPO | Admitting: Pulmonary Disease

## 2023-01-05 ENCOUNTER — Encounter: Payer: Self-pay | Admitting: Pulmonary Disease

## 2023-01-05 ENCOUNTER — Ambulatory Visit: Payer: Medicare PPO | Admitting: Pulmonary Disease

## 2023-01-05 VITALS — BP 126/62 | HR 101 | Temp 97.7°F | Ht 64.7 in | Wt 194.0 lb

## 2023-01-05 DIAGNOSIS — J849 Interstitial pulmonary disease, unspecified: Secondary | ICD-10-CM

## 2023-01-05 DIAGNOSIS — Z5181 Encounter for therapeutic drug level monitoring: Secondary | ICD-10-CM | POA: Diagnosis not present

## 2023-01-05 LAB — PULMONARY FUNCTION TEST
DL/VA % pred: 59 %
DL/VA: 2.44 ml/min/mmHg/L
DLCO cor % pred: 25 %
DLCO cor: 5.12 ml/min/mmHg
DLCO unc % pred: 25 %
DLCO unc: 5.12 ml/min/mmHg
FEF 25-75 Post: 1.54 L/sec
FEF 25-75 Pre: 2.29 L/sec
FEF2575-%Change-Post: -32 %
FEF2575-%Pred-Post: 79 %
FEF2575-%Pred-Pre: 118 %
FEV1-%Change-Post: 7 %
FEV1-%Pred-Post: 47 %
FEV1-%Pred-Pre: 44 %
FEV1-Post: 1.11 L
FEV1-Pre: 1.03 L
FEV1FVC-%Change-Post: 0 %
FEV1FVC-%Pred-Pre: 125 %
FEV6-%Change-Post: 8 %
FEV6-%Pred-Post: 39 %
FEV6-%Pred-Pre: 36 %
FEV6-Post: 1.17 L
FEV6-Pre: 1.08 L
FEV6FVC-%Pred-Post: 104 %
FEV6FVC-%Pred-Pre: 104 %
FVC-%Change-Post: 8 %
FVC-%Pred-Post: 38 %
FVC-%Pred-Pre: 35 %
FVC-Post: 1.17 L
FVC-Pre: 1.08 L
Post FEV1/FVC ratio: 95 %
Post FEV6/FVC ratio: 100 %
Pre FEV1/FVC ratio: 96 %
Pre FEV6/FVC Ratio: 100 %
RV % pred: 45 %
RV: 1.02 L
TLC % pred: 45 %
TLC: 2.34 L

## 2023-01-05 NOTE — Patient Instructions (Signed)
Your PFTs do show decline in lung function. Continue using the supplemental oxygen medications as prescribed I will place an order for an injection therapy for Rituxan Follow-up in 3 months with either video or in person visit.

## 2023-01-05 NOTE — Progress Notes (Signed)
Kristie [name]    [id number]    09/19/1951  Primary Care Physician:Moon, Amy A, NP  Referring Physician: Lowella Dandy, NP [place],  [place] [address]  Problem list: CTD related interstitial lung disease. Sjogren's syndrome On Imuran since March 2021 Pulmonary hypertension.  On [name] since June 2022 Esbriet started February 2023  HPI: 72 y.o.  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  Followed with Dr. Amil Amen and Marella Chimes at [facility] rheumatology for Sjogren's syndrome with positive ANA, SSA, sicca syndrome and positive rheumatoid factor. 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  History also notable for coronary artery disease with stent placement at [facility].  Echocardiogram at [facility] last year shows moderate pulmonary hypertension which improved on follow up echo.  2021 Evaluated in end of Jan 2021 for elevated WBC count.  SARS-CoV-2 test was negative.  CT with changes of pneumonia and she was given doxycycline in the ED High-res CT scan does not show ongoing pneumonia but has fibrosis and probable UIP pattern  Underwent bronchoscopy on 02/26/2020 to rule out infection given elevated WBC count Given prednisone taper and started on azathioprine 50 mg and Bactrim on 3/21.  Azathioprine increased to 100 mg on 5/21 Has also been evaluated by oncology on 03/26/2020 for elevated WBC count thought to be secondary to inflammation secondary to autoimmune disease.  2022 Has followed up with Dr. Silas Flood and started on [name] since June 2022 She developed COVID-19 in September 2022.  Treated with oral antivirals and did not require hospitalization.  She was symptomatic only for 2 days and recovered after that  2023 Referred to  [facility] for transplant evaluation in early 2023 but she was not a candidate.  Started Esbriet February 2023.  Pets: No pets  Occupation: Retired Recruitment consultant Exposures: No known exposures.  No mold, hot tub, Jacuzzi.  No down pillows or comforter Smoking history: Never smoked Travel history: No significant travel history Relevant family history: No significant family history of lung  Interim History: Continues on Imuran, Esbriet and [name] Had an episode of worsening dyspnea in December 2023 and treated with 2 rounds of antibiotics. On supplemental oxygen 5 L at rest and 15 L with exertion  Outpatient Encounter Medications as of 01/05/2023  Medication Sig   albuterol (PROVENTIL) (2.5 MG/3ML) 0.083% nebulizer solution TAKE 3MLS BY NEBULIZTION EVERY 4 HOURS AS NEEDED FOR WHEEZING OR SHORTNESS OF BREATH   albuterol (VENTOLIN HFA) 108 (90 Base) MCG/ACT inhaler Inhale 2 puffs into the lungs every 4 (four) hours as needed for wheezing or shortness of breath.   amLODIPine-Valsartan-HCTZ 10-160-12.5 MG TABS    Ascorbic Acid (VITAMIN C) 100 MG tablet Take 100 mg by mouth daily.   aspirin EC 81 MG tablet Take 81 mg by mouth daily.   atorvastatin (LIPITOR) 80 MG tablet Take 80 mg by mouth daily.    azaTHIOprine (IMURAN) 50 MG tablet TAKE 3 TABLETS(150 MG) BY MOUTH DAILY   azithromycin (ZITHROMAX) 250 MG tablet Take 1 tablet (250 mg total) by mouth daily.   baclofen (LIORESAL) 10 MG tablet Take 10 mg by mouth 3 (three) times daily as needed.   budesonide-formoterol (SYMBICORT) 160-4.5 MCG/ACT inhaler Inhale 2 puffs into the lungs 2 (two) times  daily.   Calcium 250 MG CAPS Take 500 mg by mouth 2 (two) times daily.   citalopram (CELEXA) 20 MG tablet Take 20 mg by mouth daily.   furosemide (LASIX) 20 MG tablet Take 1 tablet (20 mg total) by mouth daily.   gabapentin (NEURONTIN) 100 MG capsule    hydrOXYzine (ATARAX/VISTARIL) 25 MG tablet Take 25 mg by mouth at bedtime.   levothyroxine (SYNTHROID) 100 MCG  tablet Take 100 mcg by mouth daily before breakfast.   loratadine-pseudoephedrine (CLARITIN-D 24-HOUR) 10-240 MG 24 hr tablet Take 1 tablet by mouth daily.   Magnesium 250 MG TABS Take 1 tablet by mouth at bedtime.   montelukast (SINGULAIR) 10 MG tablet Take 10 mg by mouth daily.    Multiple Vitamin (MULTIVITAMIN WITH MINERALS) TABS tablet Take 1 tablet by mouth daily.   ondansetron (ZOFRAN ODT) 4 MG disintegrating tablet Take 1 tablet (4 mg total) by mouth every 8 (eight) hours as needed for nausea or vomiting.   pantoprazole (PROTONIX) 40 MG tablet TAKE 1 TABLET BY MOUTH DAILY 30 TO 60 MINUTES BEFORE FIRST MEAL OF THE DAY   pimecrolimus (ELIDEL) 1 % cream Apply 1 application topically 2 (two) times daily.   Pirfenidone 801 MG TABS TAKE 1 TABLET BY MOUTH THREE TIMES DAILY WITH MEALS.   ranolazine (RANEXA) 500 MG 12 hr tablet    sulfamethoxazole-trimethoprim (BACTRIM DS) 800-160 MG tablet TAKE 1 TABLET BY MOUTH 3 TIMES A WEEK   Treprostinil ([name] DPI MAINTENANCE KIT) 64 MCG POWD Inhale 64 mcg into the lungs in the morning, at noon, in the evening, and at bedtime.   No facility-administered encounter medications on file as of 01/05/2023.   Physical Exam: Blood pressure 126/62, pulse (!) 101, temperature 97.7 F (36.5 C), temperature source Oral, height 5' 4.7" (1.643 m), weight 194 lb (88 kg), SpO2 98 %. Gen:      No acute distress HEENT:  EOMI, sclera anicteric Neck:     No masses; no thyromegaly Lungs:    Clear to auscultation bilaterally; normal respiratory effort CV:         Regular rate and rhythm; no murmurs Abd:      + bowel sounds; soft, non-tender; no palpable masses, no distension Ext:    No edema; adequate peripheral perfusion Skin:      Warm and dry; no rash Neuro: alert and oriented x 3 Psych: normal mood and affect   Data Reviewed: Imaging: CT high-resolution 07/24/2019-patchy groundglass with septal thickening, reticulation, bronchiectasis with no honeycombing.  Basal  gradient noted.  Probable UIP pattern  CTA 01/10/2020-No PE, mild bibasal consolidation.  Chronic ILD.  CT high-resolution 02/05/2020-pulmonary fibrosis and probable UIP pattern without progression since 2020.  CT high-resolution 01/16/2022-progressive interstitial lung disease and probable UIP pattern, mild cardiomegaly, aortic and coronary atherosclerosis I have reviewed the images personally.  PFTs:  03/09/2020 FVC 1.74 (54%), FEV1 1.51 [61%],/57, TLC 2.75 [52%], DLCO 8.44 [41%] Severe restriction and diffusion defect.  08/03/2020 FVC 1.74 [54%], FEV1 1.61 [6 6%], F/F 93 TLC 2.75 [53%], DLCO 9.52 [47%] Severe restriction and diffusion defect with improvement in DLCO compared to prior  01/05/2023 FVC 1.17 [38%], FEV1 1.11 [47%], F/F95, TLC 2.34 [45%], DLCO 5.12 [25%] Severe restriction, diffusion defect  6-minute walk test 02/06/2020-374 m, nadir O2 sat 94%  Labs: CBC 05/29/2019-WBC 13.2, eos 6.9%, absolute eosinophil count 911 CBC 03/26/2020 - WBC 14.8 eos 8%, absolute eosinophil count 1184  ANA 05/29/2019- > 1:1280 Repeat CTD serologies 01/09/2020-ANA 1 is to 1280,  rheumatoid factor 14, positive SSA  SARS-CoV-2 01/10/2020- negative  Bronchoscopy 02/26/2020- Cell count 113, 42% lymphs Microbiology, cytology -negative  Received labs from primary care dated 05/01/2022 CBC-WBC 9, hemoglobin 12.7, platelets 468, eos 6%, absolute eosinophil count 540  Complaints metabolic panel significant only for potassium of 3.4 and glucose 148.  Hepatic panel is normal  Cardiac: Echocardiogram ([facility]) 04/18/2019 Normal LV size, LVEF 26-41%, grade 1 diastolic dysfunction.  RVSP of 40-50 consistent with moderate pulmonary  Echocardiogram 01/12/2020 Normal LVEF 58-30%, grade 1 diastolic dysfunction, RVSP 35  Assessment:  CTD related interstitial lung disease, pulmonary fibrosis Sjogren's syndrome Continue on Imuran 150 mg and Esbriet started for progressive pulmonary fibrosis. Start Rituxan  therapy Not a candidate for lung transplant at [facility] from December reviewed which were within normal limits  Pulmonary hypertension On [name] DPI. proBNP earlier this year was normal  Goals of care We had multiple discussions on goals of care at every visit.  She was previously DNR but under pressure from her children and she requested back to full code.  Will continue discussions about goals of care.  Her husband is realistic but patient currently wants everything done. Palliative care is following her at home.  Plan/Recommendations: - Continue Imuran, Esbriet - [name] MDI - Place order for Rituxan - Palliative care   Marshell Garfinkel MD [facility] 01/05/2023, 4:28 PM  CC: Lowella Dandy, NP

## 2023-01-08 DIAGNOSIS — J849 Interstitial pulmonary disease, unspecified: Secondary | ICD-10-CM | POA: Diagnosis not present

## 2023-01-08 DIAGNOSIS — J8410 Pulmonary fibrosis, unspecified: Secondary | ICD-10-CM | POA: Diagnosis not present

## 2023-01-08 DIAGNOSIS — Z9981 Dependence on supplemental oxygen: Secondary | ICD-10-CM | POA: Diagnosis not present

## 2023-01-09 DIAGNOSIS — N760 Acute vaginitis: Secondary | ICD-10-CM | POA: Diagnosis not present

## 2023-01-09 DIAGNOSIS — I700 Atherosclerosis of aorta: Secondary | ICD-10-CM | POA: Diagnosis not present

## 2023-01-09 DIAGNOSIS — R300 Dysuria: Secondary | ICD-10-CM | POA: Diagnosis not present

## 2023-01-10 DIAGNOSIS — Z9981 Dependence on supplemental oxygen: Secondary | ICD-10-CM | POA: Diagnosis not present

## 2023-01-10 DIAGNOSIS — J849 Interstitial pulmonary disease, unspecified: Secondary | ICD-10-CM | POA: Diagnosis not present

## 2023-01-10 DIAGNOSIS — J8410 Pulmonary fibrosis, unspecified: Secondary | ICD-10-CM | POA: Diagnosis not present

## 2023-01-12 ENCOUNTER — Telehealth: Payer: Self-pay | Admitting: Pulmonary Disease

## 2023-01-12 NOTE — Telephone Encounter (Signed)
Sir,  Looks like you placed the orders in October for this patient to get a CT scan in 3-4 months. Patient is telling Kristie [name] that you told her that she does not need the CT scan.   Sir can you confirm or deny if the patient is needing the CT scan?  Please advise sir   Thank you

## 2023-01-16 NOTE — Telephone Encounter (Signed)
I may have miscommunicated but I do want her to get the CT as her last one was 1 year ago.

## 2023-01-16 NOTE — Telephone Encounter (Signed)
Called and spoke with patient.  Patient aware Dr. Vaughan Browner would like her to keep OV scheduled at [facility] for chest CT.  Appointment time per [place], [facility] 01/19/23 at 1230 given.  Understanding stated.  Nothing further at this time.

## 2023-01-16 NOTE — Telephone Encounter (Signed)
Can someone call and explain this to the patient

## 2023-01-17 DIAGNOSIS — J45909 Unspecified asthma, uncomplicated: Secondary | ICD-10-CM | POA: Diagnosis not present

## 2023-01-17 DIAGNOSIS — J849 Interstitial pulmonary disease, unspecified: Secondary | ICD-10-CM | POA: Diagnosis not present

## 2023-01-22 DIAGNOSIS — J479 Bronchiectasis, uncomplicated: Secondary | ICD-10-CM | POA: Diagnosis not present

## 2023-01-22 DIAGNOSIS — I700 Atherosclerosis of aorta: Secondary | ICD-10-CM | POA: Diagnosis not present

## 2023-01-22 DIAGNOSIS — J849 Interstitial pulmonary disease, unspecified: Secondary | ICD-10-CM | POA: Diagnosis not present

## 2023-01-25 ENCOUNTER — Telehealth: Payer: Self-pay | Admitting: Pharmacist

## 2023-01-25 NOTE — Telephone Encounter (Signed)
Submitted a Prior Authorization RENEWAL request to [facility] for [name] DPI via CoverMyMeds. Will update once we receive a response.  Key: [id number]  Per automated response: Authorization already on file for this request. Authorization starting on 12/11/2022 and ending on 12/10/2023.  Knox Saliva, PharmD, MPH, BCPS, CPP Clinical Pharmacist (Rheumatology and Pulmonology)

## 2023-01-26 ENCOUNTER — Telehealth: Payer: Self-pay | Admitting: Pulmonary Disease

## 2023-01-26 NOTE — Telephone Encounter (Signed)
Called and spoke with patient. Patient stated that she wanted her CT scan results.   PM, please advise.

## 2023-01-27 DIAGNOSIS — J45909 Unspecified asthma, uncomplicated: Secondary | ICD-10-CM | POA: Diagnosis not present

## 2023-01-27 DIAGNOSIS — J849 Interstitial pulmonary disease, unspecified: Secondary | ICD-10-CM | POA: Diagnosis not present

## 2023-01-29 NOTE — Telephone Encounter (Signed)
Please let her know that CT scan is stable compared to the prior scan in 2023.  Continue current therapy.

## 2023-01-29 NOTE — Telephone Encounter (Signed)
Went over CT scan results with patient. She verbalized understanding. Nothing further needed.

## 2023-02-02 ENCOUNTER — Telehealth: Payer: Self-pay | Admitting: Pulmonary Disease

## 2023-02-02 NOTE — Telephone Encounter (Signed)
Patient would like to check on approval for the injections she is supposed to start.  Please call to give an update.  Call nurse or patient at [phone]

## 2023-02-02 NOTE — Telephone Encounter (Signed)
Any updates on patients injection? Has this been approved

## 2023-02-07 NOTE — Telephone Encounter (Signed)
[facility] is calling again to get the status of the patient's request for approval for the injection.  She stated she has not heard from the doctor's office.  Pleae call Vicente Males to give her an update at [phone]

## 2023-02-08 DIAGNOSIS — J8410 Pulmonary fibrosis, unspecified: Secondary | ICD-10-CM | POA: Diagnosis not present

## 2023-02-08 DIAGNOSIS — J849 Interstitial pulmonary disease, unspecified: Secondary | ICD-10-CM | POA: Diagnosis not present

## 2023-02-08 DIAGNOSIS — Z9981 Dependence on supplemental oxygen: Secondary | ICD-10-CM | POA: Diagnosis not present

## 2023-02-12 ENCOUNTER — Other Ambulatory Visit: Payer: Self-pay | Admitting: Pulmonary Disease

## 2023-02-12 DIAGNOSIS — J849 Interstitial pulmonary disease, unspecified: Secondary | ICD-10-CM

## 2023-02-13 ENCOUNTER — Encounter: Payer: Self-pay | Admitting: Pulmonary Disease

## 2023-02-13 ENCOUNTER — Other Ambulatory Visit: Payer: Self-pay | Admitting: Pharmacist

## 2023-02-13 DIAGNOSIS — Z111 Encounter for screening for respiratory tuberculosis: Secondary | ICD-10-CM

## 2023-02-13 DIAGNOSIS — Z79899 Other long term (current) drug therapy: Secondary | ICD-10-CM | POA: Insufficient documentation

## 2023-02-13 DIAGNOSIS — J849 Interstitial pulmonary disease, unspecified: Secondary | ICD-10-CM

## 2023-02-13 NOTE — Progress Notes (Signed)
Infusion referral placed to [facility] for RITUXAN Diagnosis: ILD, Sjogren's  Dose: '1000mg'$  at Day 0 and Day 14; repeat cycle every 14 days  Last Clinic Visit: 01/05/23 Next Clinic Visit: not yet scheduled  Labs: will need updated CBC prior to initation TB Gold: negative 03/15/2022   Orders placed for RITUXAN x 2 doses along with premedication of acetaminophen '650mg'$  PO,  and diphenhydramine '50mg'$ , and Solumedrol IV '125mg'$  to be administered 30 minutes before medication infusion.  Standing CBC with diff/platelet and CMP with GFR orders placed to be drawn with visit.  Next TB gold due 03/16/2023. Order placed  Knox Saliva, PharmD, MPH, BCPS, CPP Clinical Pharmacist (Rheumatology and Pulmonology)

## 2023-02-13 NOTE — Telephone Encounter (Signed)
Referral for Rituxan infusions has been placed to [facility].  Returned call to Vicente Males to advise of this. Left VM with my personal number if anything specific is needed  Knox Saliva, PharmD, MPH, BCPS, CPP Clinical Pharmacist (Rheumatology and Pulmonology)

## 2023-02-13 NOTE — Telephone Encounter (Signed)
Thanks Devki. Can you place a referral to the infusion center?

## 2023-02-15 DIAGNOSIS — J849 Interstitial pulmonary disease, unspecified: Secondary | ICD-10-CM | POA: Diagnosis not present

## 2023-02-15 DIAGNOSIS — J45909 Unspecified asthma, uncomplicated: Secondary | ICD-10-CM | POA: Diagnosis not present

## 2023-02-19 ENCOUNTER — Other Ambulatory Visit: Payer: Self-pay

## 2023-02-19 ENCOUNTER — Telehealth: Payer: Self-pay

## 2023-02-19 MED ORDER — AZITHROMYCIN 250 MG PO TABS: ORAL_TABLET | ORAL | 0 refills | Status: AC

## 2023-02-19 NOTE — Telephone Encounter (Signed)
Received a call from pt's benefits nurse who state pt c/o increased cough. Spoke with pt who states productive cough (white) has increased. Pt states when she is coughing O2 drops down to 79 - 82% on 6 L. Pt stated that O2 sats do recover once coughing has stopped. Dr. Vaughan Browner please advise.

## 2023-02-19 NOTE — Telephone Encounter (Signed)
Please send in a prescription for z pack

## 2023-02-19 NOTE — Telephone Encounter (Signed)
Called and spoke w/ pt informed her I was sending in a z-pak per PM. She verbalized understanding, nothing further needed at the time of call. Closing encounter

## 2023-02-20 ENCOUNTER — Telehealth: Payer: Self-pay | Admitting: Pharmacy Technician

## 2023-02-20 NOTE — Telephone Encounter (Signed)
Auth Submission: APPROVED Payer: [employer] MEDICARE Medication & CPT/J Code(s) submitted: Rituxan (Rituximab) [id number] Route of submission (phone, fax, portal):  Phone # [phone] Fax [phone] Auth type: Buy/Bill Units/visits requested: X 2 DOSES Reference number: [id number] Approval from: 02/14/23 to 12/11/23

## 2023-02-22 ENCOUNTER — Other Ambulatory Visit: Payer: Self-pay | Admitting: Pharmacist

## 2023-02-22 DIAGNOSIS — J849 Interstitial pulmonary disease, unspecified: Secondary | ICD-10-CM

## 2023-02-22 DIAGNOSIS — I272 Pulmonary hypertension, unspecified: Secondary | ICD-10-CM

## 2023-02-22 MED ORDER — TYVASO DPI MAINTENANCE KIT 64 MCG IN POWD: 64.0000 ug | Freq: Four times a day (QID) | RESPIRATORY_TRACT | 5 refills | Status: DC

## 2023-02-22 NOTE — Progress Notes (Signed)
Rituxan treatment cycle is scheduled for 03/02/23 and 03/16/23

## 2023-02-22 NOTE — Telephone Encounter (Signed)
Refill for [name] DPI snet to [facility]: 01/05/23 Provider: Dr. Wendie Simmer, PharmD, MPH, BCPS, CPP Clinical Pharmacist (Rheumatology and Pulmonology)

## 2023-02-25 DIAGNOSIS — J45909 Unspecified asthma, uncomplicated: Secondary | ICD-10-CM | POA: Diagnosis not present

## 2023-02-25 DIAGNOSIS — J849 Interstitial pulmonary disease, unspecified: Secondary | ICD-10-CM | POA: Diagnosis not present

## 2023-02-28 DIAGNOSIS — I2510 Atherosclerotic heart disease of native coronary artery without angina pectoris: Secondary | ICD-10-CM | POA: Diagnosis not present

## 2023-02-28 DIAGNOSIS — Z955 Presence of coronary angioplasty implant and graft: Secondary | ICD-10-CM | POA: Diagnosis not present

## 2023-02-28 DIAGNOSIS — I272 Pulmonary hypertension, unspecified: Secondary | ICD-10-CM | POA: Diagnosis not present

## 2023-02-28 DIAGNOSIS — Z1330 Encounter for screening examination for mental health and behavioral disorders, unspecified: Secondary | ICD-10-CM | POA: Diagnosis not present

## 2023-02-28 DIAGNOSIS — M3502 Sicca syndrome with lung involvement: Secondary | ICD-10-CM | POA: Diagnosis not present

## 2023-02-28 DIAGNOSIS — J8410 Pulmonary fibrosis, unspecified: Secondary | ICD-10-CM | POA: Diagnosis not present

## 2023-03-02 ENCOUNTER — Other Ambulatory Visit (INDEPENDENT_AMBULATORY_CARE_PROVIDER_SITE_OTHER): Payer: Medicare PPO

## 2023-03-02 ENCOUNTER — Ambulatory Visit (INDEPENDENT_AMBULATORY_CARE_PROVIDER_SITE_OTHER): Payer: Medicare PPO

## 2023-03-02 VITALS — BP 109/74 | HR 88 | Temp 97.4°F | Resp 18 | Ht 64.0 in | Wt 193.0 lb

## 2023-03-02 DIAGNOSIS — J8410 Pulmonary fibrosis, unspecified: Secondary | ICD-10-CM | POA: Diagnosis not present

## 2023-03-02 DIAGNOSIS — J849 Interstitial pulmonary disease, unspecified: Secondary | ICD-10-CM

## 2023-03-02 DIAGNOSIS — Z111 Encounter for screening for respiratory tuberculosis: Secondary | ICD-10-CM | POA: Diagnosis not present

## 2023-03-02 DIAGNOSIS — Z79899 Other long term (current) drug therapy: Secondary | ICD-10-CM

## 2023-03-02 DIAGNOSIS — M3500 Sicca syndrome, unspecified: Secondary | ICD-10-CM

## 2023-03-02 LAB — CBC WITH DIFFERENTIAL/PLATELET
Basophils Absolute: 0.1 10*3/uL (ref 0.0–0.1)
Basophils Relative: 0.8 % (ref 0.0–3.0)
Eosinophils Absolute: 0.8 10*3/uL — ABNORMAL HIGH (ref 0.0–0.7)
Eosinophils Relative: 7.5 % — ABNORMAL HIGH (ref 0.0–5.0)
HCT: 39.4 % (ref 36.0–46.0)
Hemoglobin: 13.1 g/dL (ref 12.0–15.0)
Lymphocytes Relative: 11.5 % — ABNORMAL LOW (ref 12.0–46.0)
Lymphs Abs: 1.2 10*3/uL (ref 0.7–4.0)
MCHC: 33.3 g/dL (ref 30.0–36.0)
MCV: 100.1 fl — ABNORMAL HIGH (ref 78.0–100.0)
Monocytes Absolute: 0.9 10*3/uL (ref 0.1–1.0)
Monocytes Relative: 8.6 % (ref 3.0–12.0)
Neutro Abs: 7.6 10*3/uL (ref 1.4–7.7)
Neutrophils Relative %: 71.6 % (ref 43.0–77.0)
Platelets: 418 10*3/uL — ABNORMAL HIGH (ref 150.0–400.0)
RBC: 3.93 Mil/uL (ref 3.87–5.11)
RDW: 15.7 % — ABNORMAL HIGH (ref 11.5–15.5)
WBC: 10.6 10*3/uL — ABNORMAL HIGH (ref 4.0–10.5)

## 2023-03-02 MED ORDER — DIPHENHYDRAMINE HCL 25 MG PO CAPS
50.0000 mg | ORAL_CAPSULE | Freq: Once | ORAL | Status: AC
  Administered 2023-03-02: 50 mg via ORAL
  Filled 2023-03-02: qty 2

## 2023-03-02 MED ORDER — SODIUM CHLORIDE 0.9% FLUSH: 10.0000 mL | Freq: Once | INTRAVENOUS | Status: DC | PRN

## 2023-03-02 MED ORDER — SODIUM CHLORIDE 0.9 % IV SOLN
1000.0000 mg | Freq: Once | INTRAVENOUS | Status: AC
  Administered 2023-03-02: 1000 mg via INTRAVENOUS
  Filled 2023-03-02: qty 100

## 2023-03-02 MED ORDER — SODIUM CHLORIDE 0.9% FLUSH: 3.0000 mL | Freq: Once | INTRAVENOUS | Status: DC | PRN

## 2023-03-02 MED ORDER — ALTEPLASE 2 MG IJ SOLR: 2.0000 mg | Freq: Once | INTRAMUSCULAR | Status: DC | PRN

## 2023-03-02 MED ORDER — ACETAMINOPHEN 325 MG PO TABS
650.0000 mg | ORAL_TABLET | Freq: Once | ORAL | Status: AC
  Administered 2023-03-02: 650 mg via ORAL
  Filled 2023-03-02: qty 2

## 2023-03-02 MED ORDER — HEPARIN SOD (PORK) LOCK FLUSH 100 UNIT/ML IV SOLN: 500.0000 [IU] | Freq: Once | INTRAVENOUS | Status: DC | PRN

## 2023-03-02 MED ORDER — METHYLPREDNISOLONE SODIUM SUCC 125 MG IJ SOLR
125.0000 mg | Freq: Once | INTRAMUSCULAR | Status: AC
  Administered 2023-03-02: 125 mg via INTRAVENOUS
  Filled 2023-03-02: qty 2

## 2023-03-02 MED ORDER — ANTICOAGULANT SODIUM CITRATE 4% (200MG/5ML) IV SOLN: 5.0000 mL | Freq: Once | Status: DC | PRN

## 2023-03-02 MED ORDER — HEPARIN SOD (PORK) LOCK FLUSH 100 UNIT/ML IV SOLN: 250.0000 [IU] | Freq: Once | INTRAVENOUS | Status: DC | PRN

## 2023-03-02 NOTE — Patient Instructions (Signed)
Rituximab Injection What is this medication? RITUXIMAB (ri TUX i mab) treats leukemia and lymphoma. It works by blocking a protein that causes cancer cells to grow and multiply. This helps to slow or stop the spread of cancer cells. It may also be used to treat autoimmune conditions, such as arthritis. It works by slowing down an overactive immune system. It is a monoclonal antibody. This medicine may be used for other purposes; ask your health care provider or pharmacist if you have questions. COMMON BRAND NAME(S): RIABNI, Rituxan, RUXIENCE, truxima What should I tell my care team before I take this medication? They need to know if you have any of these conditions: Chest pain Heart disease Immune system problems Infection, such as chickenpox, cold sores, hepatitis B, herpes Irregular heartbeat or rhythm Kidney disease Low blood counts, such as low white cells, platelets, red cells Lung disease Recent or upcoming vaccine An unusual or allergic reaction to rituximab, other medications, foods, dyes, or preservatives Pregnant or trying to get pregnant Breast-feeding How should I use this medication? This medication is injected into a vein. It is given by a care team in a hospital or clinic setting. A special MedGuide will be given to you before each treatment. Be sure to read this information carefully each time. Talk to your care team about the use of this medication in children. While this medication may be prescribed for children as young as 6 months for selected conditions, precautions do apply. Overdosage: If you think you have taken too much of this medicine contact a poison control center or emergency room at once. NOTE: This medicine is only for you. Do not share this medicine with others. What if I miss a dose? Keep appointments for follow-up doses. It is important not to miss your dose. Call your care team if you are unable to keep an appointment. What may interact with this  medication? Do not take this medication with any of the following: Live vaccines This medication may also interact with the following: Cisplatin This list may not describe all possible interactions. Give your health care provider a list of all the medicines, herbs, non-prescription drugs, or dietary supplements you use. Also tell them if you smoke, drink alcohol, or use illegal drugs. Some items may interact with your medicine. What should I watch for while using this medication? Your condition will be monitored carefully while you are receiving this medication. You may need blood work while taking this medication. This medication can cause serious infusion reactions. To reduce the risk your care team may give you other medications to take before receiving this one. Be sure to follow the directions from your care team. This medication may increase your risk of getting an infection. Call your care team for advice if you get a fever, chills, sore throat, or other symptoms of a cold or flu. Do not treat yourself. Try to avoid being around people who are sick. Call your care team if you are around anyone with measles, chickenpox, or if you develop sores or blisters that do not heal properly. Avoid taking medications that contain aspirin, acetaminophen, ibuprofen, naproxen, or ketoprofen unless instructed by your care team. These medications may hide a fever. This medication may cause serious skin reactions. They can happen weeks to months after starting the medication. Contact your care team right away if you notice fevers or flu-like symptoms with a rash. The rash may be red or purple and then turn into blisters or peeling of the skin.   You may also notice a red rash with swelling of the face, lips, or lymph nodes in your neck or under your arms. In some patients, this medication may cause a serious brain infection that may cause death. If you have any problems seeing, thinking, speaking, walking, or  standing, tell your care team right away. If you cannot reach your care team, urgently seek another source of medical care. Talk to your care team if you may be pregnant. Serious birth defects can occur if you take this medication during pregnancy and for 12 months after the last dose. You will need a negative pregnancy test before starting this medication. Contraception is recommended while taking this medication and for 12 months after the last dose. Your care team can help you find the option that works for you. Do not breastfeed while taking this medication and for at least 6 months after the last dose. What side effects may I notice from receiving this medication? Side effects that you should report to your care team as soon as possible: Allergic reactions or angioedema--skin rash, itching or hives, swelling of the face, eyes, lips, tongue, arms, or legs, trouble swallowing or breathing Bowel blockage--stomach cramping, unable to have a bowel movement or pass gas, loss of appetite, vomiting Dizziness, loss of balance or coordination, confusion or trouble speaking Heart attack--pain or tightness in the chest, shoulders, arms, or jaw, nausea, shortness of breath, cold or clammy skin, feeling faint or lightheaded Heart rhythm changes--fast or irregular heartbeat, dizziness, feeling faint or lightheaded, chest pain, trouble breathing Infection--fever, chills, cough, sore throat, wounds that don't heal, pain or trouble when passing urine, general feeling of discomfort or being unwell Infusion reactions--chest pain, shortness of breath or trouble breathing, feeling faint or lightheaded Kidney injury--decrease in the amount of urine, swelling of the ankles, hands, or feet Liver injury--right upper belly pain, loss of appetite, nausea, light-colored stool, dark yellow or brown urine, yellowing skin or eyes, unusual weakness or fatigue Redness, blistering, peeling, or loosening of the skin, including  inside the mouth Stomach pain that is severe, does not go away, or gets worse Tumor lysis syndrome (TLS)--nausea, vomiting, diarrhea, decrease in the amount of urine, dark urine, unusual weakness or fatigue, confusion, muscle pain or cramps, fast or irregular heartbeat, joint pain Side effects that usually do not require medical attention (report to your care team if they continue or are bothersome): Headache Joint pain Nausea Runny or stuffy nose Unusual weakness or fatigue This list may not describe all possible side effects. Call your doctor for medical advice about side effects. You may report side effects to FDA at 1-800-FDA-1088. Where should I keep my medication? This medication is given in a hospital or clinic. It will not be stored at home. NOTE: This sheet is a summary. It may not cover all possible information. If you have questions about this medicine, talk to your doctor, pharmacist, or health care provider.  2023 Elsevier/Gold Standard (2022-04-11 00:00:00)    

## 2023-03-02 NOTE — Progress Notes (Signed)
Diagnosis: ILD  Provider:  Marshell Garfinkel MD  Procedure: Infusion  IV Type: Peripheral, IV Location: R Forearm  Rituxan (Rituximab), Dose: 1000 mg  Infusion Start Time: [id number]  Infusion Stop Time: 1425  Post Infusion IV Care: Observation period completed and Peripheral IV Discontinued  Discharge: Condition: Good, Destination: Home . AVS Provided  Performed by:  Cleophus Molt, RN

## 2023-03-06 LAB — QUANTIFERON-TB GOLD PLUS
Mitogen-NIL: 1.33 IU/mL
NIL: 0.03 IU/mL
QuantiFERON-TB Gold Plus: NEGATIVE
TB1-NIL: <0 IU/mL
TB2-NIL: <0 IU/mL

## 2023-03-08 ENCOUNTER — Telehealth: Payer: Self-pay | Admitting: Pulmonary Disease

## 2023-03-08 DIAGNOSIS — R000 Tachycardia, unspecified: Secondary | ICD-10-CM | POA: Diagnosis not present

## 2023-03-08 DIAGNOSIS — R1084 Generalized abdominal pain: Secondary | ICD-10-CM | POA: Diagnosis not present

## 2023-03-08 DIAGNOSIS — I10 Essential (primary) hypertension: Secondary | ICD-10-CM | POA: Diagnosis not present

## 2023-03-09 DIAGNOSIS — J9621 Acute and chronic respiratory failure with hypoxia: Secondary | ICD-10-CM | POA: Diagnosis not present

## 2023-03-09 DIAGNOSIS — E872 Acidosis, unspecified: Secondary | ICD-10-CM | POA: Diagnosis not present

## 2023-03-09 DIAGNOSIS — I4519 Other right bundle-branch block: Secondary | ICD-10-CM | POA: Diagnosis not present

## 2023-03-09 DIAGNOSIS — I10 Essential (primary) hypertension: Secondary | ICD-10-CM | POA: Diagnosis not present

## 2023-03-09 DIAGNOSIS — R0902 Hypoxemia: Secondary | ICD-10-CM | POA: Diagnosis not present

## 2023-03-09 DIAGNOSIS — I700 Atherosclerosis of aorta: Secondary | ICD-10-CM | POA: Diagnosis not present

## 2023-03-09 DIAGNOSIS — J84112 Idiopathic pulmonary fibrosis: Secondary | ICD-10-CM | POA: Diagnosis not present

## 2023-03-09 DIAGNOSIS — R9431 Abnormal electrocardiogram [ECG] [EKG]: Secondary | ICD-10-CM | POA: Diagnosis not present

## 2023-03-09 DIAGNOSIS — I110 Hypertensive heart disease with heart failure: Secondary | ICD-10-CM | POA: Diagnosis not present

## 2023-03-09 DIAGNOSIS — M329 Systemic lupus erythematosus, unspecified: Secondary | ICD-10-CM | POA: Diagnosis not present

## 2023-03-09 DIAGNOSIS — J849 Interstitial pulmonary disease, unspecified: Secondary | ICD-10-CM | POA: Diagnosis not present

## 2023-03-09 DIAGNOSIS — K5900 Constipation, unspecified: Secondary | ICD-10-CM | POA: Diagnosis not present

## 2023-03-09 DIAGNOSIS — R000 Tachycardia, unspecified: Secondary | ICD-10-CM | POA: Diagnosis not present

## 2023-03-09 DIAGNOSIS — A419 Sepsis, unspecified organism: Secondary | ICD-10-CM | POA: Diagnosis not present

## 2023-03-09 DIAGNOSIS — I7121 Aneurysm of the ascending aorta, without rupture: Secondary | ICD-10-CM | POA: Diagnosis not present

## 2023-03-09 DIAGNOSIS — M3500 Sicca syndrome, unspecified: Secondary | ICD-10-CM | POA: Diagnosis not present

## 2023-03-09 DIAGNOSIS — R109 Unspecified abdominal pain: Secondary | ICD-10-CM | POA: Diagnosis not present

## 2023-03-09 DIAGNOSIS — R079 Chest pain, unspecified: Secondary | ICD-10-CM | POA: Diagnosis not present

## 2023-03-09 DIAGNOSIS — Z9981 Dependence on supplemental oxygen: Secondary | ICD-10-CM | POA: Diagnosis not present

## 2023-03-09 DIAGNOSIS — J189 Pneumonia, unspecified organism: Secondary | ICD-10-CM | POA: Diagnosis not present

## 2023-03-09 DIAGNOSIS — J8410 Pulmonary fibrosis, unspecified: Secondary | ICD-10-CM | POA: Diagnosis not present

## 2023-03-09 DIAGNOSIS — Z9049 Acquired absence of other specified parts of digestive tract: Secondary | ICD-10-CM | POA: Diagnosis not present

## 2023-03-09 DIAGNOSIS — R1010 Upper abdominal pain, unspecified: Secondary | ICD-10-CM | POA: Diagnosis not present

## 2023-03-09 NOTE — Telephone Encounter (Signed)
Left message for patient's husband to call back.  

## 2023-03-10 DIAGNOSIS — J849 Interstitial pulmonary disease, unspecified: Secondary | ICD-10-CM | POA: Diagnosis not present

## 2023-03-10 DIAGNOSIS — J8410 Pulmonary fibrosis, unspecified: Secondary | ICD-10-CM | POA: Diagnosis not present

## 2023-03-10 DIAGNOSIS — Z9981 Dependence on supplemental oxygen: Secondary | ICD-10-CM | POA: Diagnosis not present

## 2023-03-16 ENCOUNTER — Other Ambulatory Visit: Payer: Self-pay | Admitting: *Deleted

## 2023-03-16 ENCOUNTER — Ambulatory Visit (INDEPENDENT_AMBULATORY_CARE_PROVIDER_SITE_OTHER): Payer: Medicare PPO | Admitting: *Deleted

## 2023-03-16 ENCOUNTER — Other Ambulatory Visit: Payer: Medicare PPO

## 2023-03-16 VITALS — BP 105/74 | HR 98 | Temp 97.8°F | Resp 28 | Ht 65.0 in | Wt 186.0 lb

## 2023-03-16 DIAGNOSIS — Z79899 Other long term (current) drug therapy: Secondary | ICD-10-CM

## 2023-03-16 DIAGNOSIS — J849 Interstitial pulmonary disease, unspecified: Secondary | ICD-10-CM

## 2023-03-16 DIAGNOSIS — J8410 Pulmonary fibrosis, unspecified: Secondary | ICD-10-CM | POA: Diagnosis not present

## 2023-03-16 DIAGNOSIS — M3500 Sicca syndrome, unspecified: Secondary | ICD-10-CM

## 2023-03-16 DIAGNOSIS — Z111 Encounter for screening for respiratory tuberculosis: Secondary | ICD-10-CM

## 2023-03-16 LAB — CBC WITH DIFFERENTIAL/PLATELET
Basophils Absolute: 0 10*3/uL (ref 0.0–0.1)
Basophils Relative: 0.2 % (ref 0.0–3.0)
Eosinophils Absolute: 0.3 10*3/uL (ref 0.0–0.7)
Eosinophils Relative: 1.9 % (ref 0.0–5.0)
HCT: 39.1 % (ref 36.0–46.0)
Hemoglobin: 13.1 g/dL (ref 12.0–15.0)
Lymphocytes Relative: 10.5 % — ABNORMAL LOW (ref 12.0–46.0)
Lymphs Abs: 1.8 10*3/uL (ref 0.7–4.0)
MCHC: 33.5 g/dL (ref 30.0–36.0)
MCV: 98 fl (ref 78.0–100.0)
Monocytes Absolute: 1.1 10*3/uL — ABNORMAL HIGH (ref 0.1–1.0)
Monocytes Relative: 6.4 % (ref 3.0–12.0)
Neutro Abs: 13.6 10*3/uL — ABNORMAL HIGH (ref 1.4–7.7)
Neutrophils Relative %: 81 % — ABNORMAL HIGH (ref 43.0–77.0)
Platelets: 659 10*3/uL — ABNORMAL HIGH (ref 150.0–400.0)
RBC: 3.99 Mil/uL (ref 3.87–5.11)
RDW: 15.8 % — ABNORMAL HIGH (ref 11.5–15.5)
WBC: 16.8 10*3/uL — ABNORMAL HIGH (ref 4.0–10.5)

## 2023-03-16 MED ORDER — METHYLPREDNISOLONE SODIUM SUCC 125 MG IJ SOLR
125.0000 mg | Freq: Once | INTRAMUSCULAR | Status: AC
  Administered 2023-03-16: 125 mg via INTRAVENOUS
  Filled 2023-03-16: qty 2

## 2023-03-16 MED ORDER — DIPHENHYDRAMINE HCL 25 MG PO CAPS
50.0000 mg | ORAL_CAPSULE | Freq: Once | ORAL | Status: AC
  Administered 2023-03-16: 50 mg via ORAL
  Filled 2023-03-16: qty 2

## 2023-03-16 MED ORDER — ACETAMINOPHEN 325 MG PO TABS
650.0000 mg | ORAL_TABLET | Freq: Once | ORAL | Status: AC
  Administered 2023-03-16: 650 mg via ORAL
  Filled 2023-03-16: qty 2

## 2023-03-16 MED ORDER — SODIUM CHLORIDE 0.9 % IV SOLN
1000.0000 mg | Freq: Once | INTRAVENOUS | Status: AC
  Administered 2023-03-16: 1000 mg via INTRAVENOUS
  Filled 2023-03-16: qty 100

## 2023-03-16 NOTE — Addendum Note (Signed)
Addended by: Murrell Redden on: 03/16/2023 11:45 AM   Modules accepted: Orders

## 2023-03-16 NOTE — Progress Notes (Signed)
Diagnosis: ILD  Provider:  Chilton Greathouse MD  Procedure: Infusion  IV Type: Peripheral, IV Location: R Forearm  Rituxan (Rituximab), Dose: 1000 mg  Infusion Start Time: 1025  Infusion Stop Time: 1351  Post Infusion IV Care: Patient declined observation and Peripheral IV Discontinued  Discharge: Condition: Good, Destination: Home . AVS Provided  Performed by:  Evelena Peat, RN

## 2023-03-18 DIAGNOSIS — J45909 Unspecified asthma, uncomplicated: Secondary | ICD-10-CM | POA: Diagnosis not present

## 2023-03-18 DIAGNOSIS — J849 Interstitial pulmonary disease, unspecified: Secondary | ICD-10-CM | POA: Diagnosis not present

## 2023-03-26 DIAGNOSIS — K5909 Other constipation: Secondary | ICD-10-CM | POA: Diagnosis not present

## 2023-03-26 DIAGNOSIS — R0609 Other forms of dyspnea: Secondary | ICD-10-CM | POA: Diagnosis not present

## 2023-03-26 DIAGNOSIS — F330 Major depressive disorder, recurrent, mild: Secondary | ICD-10-CM | POA: Diagnosis not present

## 2023-03-26 DIAGNOSIS — M549 Dorsalgia, unspecified: Secondary | ICD-10-CM | POA: Diagnosis not present

## 2023-03-26 DIAGNOSIS — E039 Hypothyroidism, unspecified: Secondary | ICD-10-CM | POA: Diagnosis not present

## 2023-03-26 DIAGNOSIS — K219 Gastro-esophageal reflux disease without esophagitis: Secondary | ICD-10-CM | POA: Diagnosis not present

## 2023-03-26 DIAGNOSIS — I10 Essential (primary) hypertension: Secondary | ICD-10-CM | POA: Diagnosis not present

## 2023-03-26 DIAGNOSIS — G8929 Other chronic pain: Secondary | ICD-10-CM | POA: Diagnosis not present

## 2023-03-27 ENCOUNTER — Other Ambulatory Visit: Payer: Self-pay | Admitting: Pulmonary Disease

## 2023-03-28 DIAGNOSIS — J45909 Unspecified asthma, uncomplicated: Secondary | ICD-10-CM | POA: Diagnosis not present

## 2023-03-28 DIAGNOSIS — J849 Interstitial pulmonary disease, unspecified: Secondary | ICD-10-CM | POA: Diagnosis not present

## 2023-04-05 DIAGNOSIS — I5032 Chronic diastolic (congestive) heart failure: Secondary | ICD-10-CM | POA: Diagnosis not present

## 2023-04-05 DIAGNOSIS — J84112 Idiopathic pulmonary fibrosis: Secondary | ICD-10-CM | POA: Diagnosis not present

## 2023-04-05 DIAGNOSIS — J9690 Respiratory failure, unspecified, unspecified whether with hypoxia or hypercapnia: Secondary | ICD-10-CM | POA: Diagnosis not present

## 2023-04-05 DIAGNOSIS — J849 Interstitial pulmonary disease, unspecified: Secondary | ICD-10-CM | POA: Diagnosis not present

## 2023-04-05 DIAGNOSIS — I2721 Secondary pulmonary arterial hypertension: Secondary | ICD-10-CM | POA: Diagnosis not present

## 2023-04-05 DIAGNOSIS — J470 Bronchiectasis with acute lower respiratory infection: Secondary | ICD-10-CM | POA: Diagnosis not present

## 2023-04-05 DIAGNOSIS — I4891 Unspecified atrial fibrillation: Secondary | ICD-10-CM | POA: Diagnosis not present

## 2023-04-05 DIAGNOSIS — R0602 Shortness of breath: Secondary | ICD-10-CM | POA: Diagnosis not present

## 2023-04-05 DIAGNOSIS — D849 Immunodeficiency, unspecified: Secondary | ICD-10-CM | POA: Diagnosis not present

## 2023-04-05 DIAGNOSIS — J9621 Acute and chronic respiratory failure with hypoxia: Secondary | ICD-10-CM | POA: Diagnosis not present

## 2023-04-05 DIAGNOSIS — J189 Pneumonia, unspecified organism: Secondary | ICD-10-CM | POA: Diagnosis not present

## 2023-04-05 DIAGNOSIS — J8410 Pulmonary fibrosis, unspecified: Secondary | ICD-10-CM | POA: Diagnosis not present

## 2023-04-05 DIAGNOSIS — R0902 Hypoxemia: Secondary | ICD-10-CM | POA: Diagnosis not present

## 2023-04-05 DIAGNOSIS — E872 Acidosis, unspecified: Secondary | ICD-10-CM | POA: Diagnosis not present

## 2023-04-05 DIAGNOSIS — I110 Hypertensive heart disease with heart failure: Secondary | ICD-10-CM | POA: Diagnosis not present

## 2023-04-05 DIAGNOSIS — Z9981 Dependence on supplemental oxygen: Secondary | ICD-10-CM | POA: Diagnosis not present

## 2023-04-05 DIAGNOSIS — R000 Tachycardia, unspecified: Secondary | ICD-10-CM | POA: Diagnosis not present

## 2023-04-10 DIAGNOSIS — J8410 Pulmonary fibrosis, unspecified: Secondary | ICD-10-CM | POA: Diagnosis not present

## 2023-04-10 DIAGNOSIS — J849 Interstitial pulmonary disease, unspecified: Secondary | ICD-10-CM | POA: Diagnosis not present

## 2023-04-10 DIAGNOSIS — Z9981 Dependence on supplemental oxygen: Secondary | ICD-10-CM | POA: Diagnosis not present

## 2023-04-11 DIAGNOSIS — J22 Unspecified acute lower respiratory infection: Secondary | ICD-10-CM | POA: Diagnosis not present

## 2023-04-11 DIAGNOSIS — I5032 Chronic diastolic (congestive) heart failure: Secondary | ICD-10-CM | POA: Diagnosis not present

## 2023-04-11 DIAGNOSIS — I110 Hypertensive heart disease with heart failure: Secondary | ICD-10-CM | POA: Diagnosis not present

## 2023-04-11 DIAGNOSIS — J470 Bronchiectasis with acute lower respiratory infection: Secondary | ICD-10-CM | POA: Diagnosis not present

## 2023-04-11 DIAGNOSIS — I700 Atherosclerosis of aorta: Secondary | ICD-10-CM | POA: Diagnosis not present

## 2023-04-11 DIAGNOSIS — J84112 Idiopathic pulmonary fibrosis: Secondary | ICD-10-CM | POA: Diagnosis not present

## 2023-04-11 DIAGNOSIS — I2721 Secondary pulmonary arterial hypertension: Secondary | ICD-10-CM | POA: Diagnosis not present

## 2023-04-11 DIAGNOSIS — F330 Major depressive disorder, recurrent, mild: Secondary | ICD-10-CM | POA: Diagnosis not present

## 2023-04-11 DIAGNOSIS — J9621 Acute and chronic respiratory failure with hypoxia: Secondary | ICD-10-CM | POA: Diagnosis not present

## 2023-04-12 ENCOUNTER — Telehealth: Payer: Self-pay | Admitting: Pulmonary Disease

## 2023-04-12 DIAGNOSIS — J9611 Chronic respiratory failure with hypoxia: Secondary | ICD-10-CM | POA: Diagnosis not present

## 2023-04-13 NOTE — Telephone Encounter (Signed)
Routing to Dr. Mannam as an FYI. 

## 2023-04-16 DIAGNOSIS — I5032 Chronic diastolic (congestive) heart failure: Secondary | ICD-10-CM | POA: Diagnosis not present

## 2023-04-16 DIAGNOSIS — J9621 Acute and chronic respiratory failure with hypoxia: Secondary | ICD-10-CM | POA: Diagnosis not present

## 2023-04-16 DIAGNOSIS — I700 Atherosclerosis of aorta: Secondary | ICD-10-CM | POA: Diagnosis not present

## 2023-04-16 DIAGNOSIS — I110 Hypertensive heart disease with heart failure: Secondary | ICD-10-CM | POA: Diagnosis not present

## 2023-04-16 DIAGNOSIS — J470 Bronchiectasis with acute lower respiratory infection: Secondary | ICD-10-CM | POA: Diagnosis not present

## 2023-04-16 DIAGNOSIS — F330 Major depressive disorder, recurrent, mild: Secondary | ICD-10-CM | POA: Diagnosis not present

## 2023-04-16 DIAGNOSIS — J84112 Idiopathic pulmonary fibrosis: Secondary | ICD-10-CM | POA: Diagnosis not present

## 2023-04-16 DIAGNOSIS — I2721 Secondary pulmonary arterial hypertension: Secondary | ICD-10-CM | POA: Diagnosis not present

## 2023-04-16 DIAGNOSIS — J22 Unspecified acute lower respiratory infection: Secondary | ICD-10-CM | POA: Diagnosis not present

## 2023-04-17 DIAGNOSIS — J849 Interstitial pulmonary disease, unspecified: Secondary | ICD-10-CM | POA: Diagnosis not present

## 2023-04-17 DIAGNOSIS — J45909 Unspecified asthma, uncomplicated: Secondary | ICD-10-CM | POA: Diagnosis not present

## 2023-04-19 DIAGNOSIS — F330 Major depressive disorder, recurrent, mild: Secondary | ICD-10-CM | POA: Diagnosis not present

## 2023-04-19 DIAGNOSIS — I110 Hypertensive heart disease with heart failure: Secondary | ICD-10-CM | POA: Diagnosis not present

## 2023-04-19 DIAGNOSIS — I5032 Chronic diastolic (congestive) heart failure: Secondary | ICD-10-CM | POA: Diagnosis not present

## 2023-04-19 DIAGNOSIS — J9621 Acute and chronic respiratory failure with hypoxia: Secondary | ICD-10-CM | POA: Diagnosis not present

## 2023-04-19 DIAGNOSIS — J471 Bronchiectasis with (acute) exacerbation: Secondary | ICD-10-CM | POA: Diagnosis not present

## 2023-04-19 DIAGNOSIS — I2721 Secondary pulmonary arterial hypertension: Secondary | ICD-10-CM | POA: Diagnosis not present

## 2023-04-19 DIAGNOSIS — J470 Bronchiectasis with acute lower respiratory infection: Secondary | ICD-10-CM | POA: Diagnosis not present

## 2023-04-19 DIAGNOSIS — J84112 Idiopathic pulmonary fibrosis: Secondary | ICD-10-CM | POA: Diagnosis not present

## 2023-04-19 DIAGNOSIS — I700 Atherosclerosis of aorta: Secondary | ICD-10-CM | POA: Diagnosis not present

## 2023-04-19 DIAGNOSIS — J22 Unspecified acute lower respiratory infection: Secondary | ICD-10-CM | POA: Diagnosis not present

## 2023-04-20 ENCOUNTER — Ambulatory Visit (INDEPENDENT_AMBULATORY_CARE_PROVIDER_SITE_OTHER): Payer: Medicare PPO | Admitting: Pulmonary Disease

## 2023-04-20 ENCOUNTER — Encounter: Payer: Self-pay | Admitting: Pulmonary Disease

## 2023-04-20 VITALS — BP 126/78 | HR 106 | Temp 98.4°F | Ht 65.0 in

## 2023-04-20 DIAGNOSIS — I272 Pulmonary hypertension, unspecified: Secondary | ICD-10-CM

## 2023-04-20 DIAGNOSIS — J849 Interstitial pulmonary disease, unspecified: Secondary | ICD-10-CM

## 2023-04-20 DIAGNOSIS — Z5181 Encounter for therapeutic drug level monitoring: Secondary | ICD-10-CM

## 2023-04-20 NOTE — Patient Instructions (Signed)
I am glad you are doing better after your recent hospitalization Continue current medication Will order a follow-up high-resolution CT in 3 months Return to clinic in 3 months

## 2023-04-20 NOTE — Progress Notes (Signed)
Kristie [name]    [id number]    07-30-1951  Primary Care Physician:Moon, Amy A, NP  Referring Physician: Hurshel Party, NP [address] [place],  [place] [address]  Problem list: CTD related interstitial lung disease. Sjogren's syndrome On Imuran since March 2021 Pulmonary hypertension.  On [name] since June 2022 Esbriet started February 2023  HPI: 72 y.o.  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  Followed with Dr. Dierdre Forth and Elpidio Anis at [facility] rheumatology for Sjogren's syndrome with positive ANA, SSA, sicca syndrome and positive rheumatoid factor. 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  History also notable for coronary artery disease with stent placement at [facility].  Echocardiogram at [facility] last year shows moderate pulmonary hypertension which improved on follow up echo.  2021 Evaluated in end of Jan 2021 for elevated WBC count.  SARS-CoV-2 test was negative.  CT with changes of pneumonia and she was given doxycycline in the ED High-res CT scan does not show ongoing pneumonia but has fibrosis and probable UIP pattern  Underwent bronchoscopy on 02/26/2020 to rule out infection given elevated WBC count Given prednisone taper and started on azathioprine 50 mg and Bactrim on 3/21.  Azathioprine increased to 100 mg on 5/21 Has also been evaluated by oncology on 03/26/2020 for elevated WBC count thought to be secondary to inflammation secondary to autoimmune disease.  2022 Has followed up with Dr. Judeth Horn and started on [name] since June 2022 She developed COVID-19 in September 2022.  Treated with oral antivirals and did not require hospitalization.  She was symptomatic only for 2 days and recovered after that  2023 Referred to  [facility] for transplant evaluation in early 2023 but she was not a candidate.  Started Esbriet February 2023.  Pets: No pets  Occupation: Retired Midwife Exposures: No known exposures.  No mold, hot tub, Jacuzzi.  No down pillows or comforter Smoking history: Never smoked Travel history: No significant travel history Relevant family history: No significant family history of lung  Interim History: Continues on Imuran, Esbriet and [name] Started Rituxan with 2 infusions given in March 2024 On supplemental oxygen 5 L at rest and 15 L with exertion  She was hospitalized twice in [place] for acute on chronic respiratory failure possible pneumonia versus ILD exacerbation. She was treated with antibiotics, steroids and feels back to normal at present.  Outpatient Encounter Medications as of 04/20/2023  Medication Sig   albuterol (PROVENTIL) (2.5 MG/3ML) 0.083% nebulizer solution TAKE BY NEBULIZTION EVERY 4 HOURS AS NEEDED FOR WHEEZING OR SHORTNESS OF BREATH   albuterol (VENTOLIN HFA) 108 (90 Base) MCG/ACT inhaler Inhale 2 puffs into the lungs every 4 (four) hours as needed for wheezing or shortness of breath.   amLODIPine-Valsartan-HCTZ 10-160-12.5 MG TABS    Ascorbic Acid (VITAMIN C) 100 MG tablet Take 100 mg by mouth daily.   aspirin EC 81 MG tablet Take 81 mg by mouth daily.   atorvastatin (LIPITOR) 80 MG tablet Take 80 mg by mouth daily.    azaTHIOprine (IMURAN) 50 MG tablet TAKE 3 TABLETS(150 MG) BY MOUTH DAILY   baclofen (LIORESAL) 10 MG tablet Take 10 mg by mouth 3 (three) times daily as needed.   budesonide-formoterol (SYMBICORT) 160-4.5 MCG/ACT inhaler Inhale  2 puffs into the lungs 2 (two) times daily.   Calcium 250 MG CAPS Take 500 mg by mouth 2 (two) times daily.   citalopram (CELEXA) 20 MG tablet Take 20 mg by mouth daily.   furosemide (LASIX) 20 MG tablet TAKE 1 TABLET BY MOUTH DAILY   gabapentin (NEURONTIN) 100 MG capsule    hydrOXYzine (ATARAX/VISTARIL) 25 MG tablet Take  25 mg by mouth at bedtime.   levothyroxine (SYNTHROID) 100 MCG tablet Take 100 mcg by mouth daily before breakfast.   loratadine-pseudoephedrine (CLARITIN-D 24-HOUR) 10-240 MG 24 hr tablet Take 1 tablet by mouth daily.   Magnesium 250 MG TABS Take 1 tablet by mouth at bedtime.   montelukast (SINGULAIR) 10 MG tablet Take 10 mg by mouth daily.    Multiple Vitamin (MULTIVITAMIN WITH MINERALS) TABS tablet Take 1 tablet by mouth daily.   ondansetron (ZOFRAN ODT) 4 MG disintegrating tablet Take 1 tablet (4 mg total) by mouth every 8 (eight) hours as needed for nausea or vomiting.   pantoprazole (PROTONIX) 40 MG tablet TAKE 1 TABLET BY MOUTH DAILY 30 TO 60 MINUTES BEFORE FIRST MEAL OF THE DAY   pimecrolimus (ELIDEL) 1 % cream Apply 1 application topically 2 (two) times daily.   Pirfenidone 801 MG TABS TAKE 1 TABLET BY MOUTH THREE TIMES DAILY WITH MEALS.   ranolazine (RANEXA) 500 MG 12 hr tablet    riTUXimab (RITUXAN IV) Inject into the vein. Infuse 1000mg  into vein at Day 0 and Day 14. Repeat cycle every 6 months   sulfamethoxazole-trimethoprim (BACTRIM DS) 800-160 MG tablet TAKE 1 TABLET BY MOUTH 3 TIMES A WEEK   Treprostinil ([name] DPI MAINTENANCE KIT) 64 MCG POWD Inhale 64 mcg into the lungs in the morning, at noon, in the evening, and at bedtime.   No facility-administered encounter medications on file as of 04/20/2023.   Physical Exam: Blood pressure 126/78, pulse (!) 106, temperature 98.4 F (36.9 C), height 5\' 5"  (1.651 m), SpO2 92 %. Gen:      No acute distress HEENT:  EOMI, sclera anicteric Neck:     No masses; no thyromegaly Lungs:   Bibasal crackles CV:         Regular rate and rhythm; no murmurs Abd:      + bowel sounds; soft, non-tender; no palpable masses, no distension Ext:    No edema; adequate peripheral perfusion Skin:      Warm and dry; no rash Neuro: alert and oriented x 3 Psych: normal mood and affect   Data Reviewed: Imaging: CT high-resolution 07/24/2019-patchy  groundglass with septal thickening, reticulation, bronchiectasis with no honeycombing.  Basal gradient noted.  Probable UIP pattern  CTA 01/10/2020-No PE, mild bibasal consolidation.  Chronic ILD.  CT high-resolution 02/05/2020-pulmonary fibrosis and probable UIP pattern without progression since 2020.  CT high-resolution 01/16/2022-progressive interstitial lung disease and probable UIP pattern, mild cardiomegaly, aortic and coronary atherosclerosis  CTA 03/09/2023-probable UIP pattern interstitial pulmonary fibrosis with mild increase in groundglass representing atelectasis versus pulm edema. I have reviewed the images personally.  PFTs:  03/09/2020 FVC 1.74 (54%), FEV1 1.51 [61%],/57, TLC 2.75 [52%], DLCO 8.44 [41%] Severe restriction and diffusion defect.  08/03/2020 FVC 1.74 [54%], FEV1 1.61 [6 6%], F/F 93 TLC 2.75 [53%], DLCO 9.52 [47%] Severe restriction and diffusion defect with improvement in DLCO compared to prior  01/05/2023 FVC 1.17 [38%], FEV1 1.11 [47%], F/F95, TLC 2.34 [45%], DLCO 5.12 [25%] Severe restriction, diffusion defect  6-minute walk test 02/06/2020-374 m, nadir O2 sat 94%  Labs:  CBC 05/29/2019-WBC 13.2, eos 6.9%, absolute eosinophil count 911 CBC 03/26/2020 - WBC 14.8 eos 8%, absolute eosinophil count 1184  ANA 05/29/2019- > 1:1280 Repeat CTD serologies 01/09/2020-ANA 1 is to 1280, rheumatoid factor 14, positive SSA  SARS-CoV-2 01/10/2020- negative  Bronchoscopy 02/26/2020- Cell count 113, 42% lymphs Microbiology, cytology -negative  Received labs from primary care dated 05/01/2022 CBC-WBC 9, hemoglobin 12.7, platelets 468, eos 6%, absolute eosinophil count 540  Complaints metabolic panel significant only for potassium of 3.4 and glucose 148.  Hepatic panel is normal  Cardiac: Echocardiogram ([facility]) 04/18/2019 Normal LV size, LVEF 45-50%, grade 1 diastolic dysfunction.  RVSP of 40-50 consistent with moderate pulmonary  Echocardiogram 01/12/2020 Normal LVEF  55-60%, grade 1 diastolic dysfunction, RVSP 35  Assessment:  CTD related interstitial lung disease, pulmonary fibrosis Sjogren's syndrome Continue on Imuran 150 mg and Esbriet started for progressive pulmonary fibrosis. Started Rituxan therapy in March of this year Not a candidate for lung transplant at [facility] Continue supplemental oxygen  Labs from last reviewed  Pulmonary hypertension On [name] DPI. proBNP earlier this year was normal  Goals of care We had multiple discussions on goals of care at every visit.  She was previously DNR but under pressure from her children and she requested back to full code.  Will continue discussions about goals of care.  Her husband is realistic but patient currently wants everything done. Palliative care is following her at home.  Plan/Recommendations: - Continue Imuran, Esbriet - [name] MDI - Rituxan every 6 months - Follow-up high-res CT in 3 months - Palliative care   Chilton Greathouse MD [facility] 04/20/2023, 2:11 PM  CC: Hurshel Party, NP

## 2023-04-20 NOTE — Addendum Note (Signed)
Addended by: Bradd Canary L on: 04/20/2023 03:44 PM   Modules accepted: Orders

## 2023-04-23 DIAGNOSIS — J9621 Acute and chronic respiratory failure with hypoxia: Secondary | ICD-10-CM | POA: Diagnosis not present

## 2023-04-23 DIAGNOSIS — E872 Acidosis, unspecified: Secondary | ICD-10-CM | POA: Diagnosis not present

## 2023-04-23 DIAGNOSIS — J84112 Idiopathic pulmonary fibrosis: Secondary | ICD-10-CM | POA: Diagnosis not present

## 2023-04-23 DIAGNOSIS — J158 Pneumonia due to other specified bacteria: Secondary | ICD-10-CM | POA: Diagnosis not present

## 2023-04-23 DIAGNOSIS — A4189 Other specified sepsis: Secondary | ICD-10-CM | POA: Diagnosis not present

## 2023-04-23 DIAGNOSIS — J159 Unspecified bacterial pneumonia: Secondary | ICD-10-CM | POA: Diagnosis not present

## 2023-04-23 DIAGNOSIS — I5032 Chronic diastolic (congestive) heart failure: Secondary | ICD-10-CM | POA: Diagnosis not present

## 2023-04-23 DIAGNOSIS — R062 Wheezing: Secondary | ICD-10-CM | POA: Diagnosis not present

## 2023-04-23 DIAGNOSIS — I272 Pulmonary hypertension, unspecified: Secondary | ICD-10-CM | POA: Diagnosis not present

## 2023-04-23 DIAGNOSIS — R110 Nausea: Secondary | ICD-10-CM | POA: Diagnosis not present

## 2023-04-23 DIAGNOSIS — J849 Interstitial pulmonary disease, unspecified: Secondary | ICD-10-CM | POA: Diagnosis not present

## 2023-04-23 DIAGNOSIS — A084 Viral intestinal infection, unspecified: Secondary | ICD-10-CM | POA: Diagnosis not present

## 2023-04-23 DIAGNOSIS — R197 Diarrhea, unspecified: Secondary | ICD-10-CM | POA: Diagnosis not present

## 2023-04-23 DIAGNOSIS — R0603 Acute respiratory distress: Secondary | ICD-10-CM | POA: Diagnosis not present

## 2023-04-23 DIAGNOSIS — R1111 Vomiting without nausea: Secondary | ICD-10-CM | POA: Diagnosis not present

## 2023-04-23 DIAGNOSIS — R000 Tachycardia, unspecified: Secondary | ICD-10-CM | POA: Diagnosis not present

## 2023-04-23 DIAGNOSIS — R0902 Hypoxemia: Secondary | ICD-10-CM | POA: Diagnosis not present

## 2023-04-23 DIAGNOSIS — R112 Nausea with vomiting, unspecified: Secondary | ICD-10-CM | POA: Diagnosis not present

## 2023-04-23 DIAGNOSIS — J45909 Unspecified asthma, uncomplicated: Secondary | ICD-10-CM | POA: Diagnosis not present

## 2023-04-23 DIAGNOSIS — I110 Hypertensive heart disease with heart failure: Secondary | ICD-10-CM | POA: Diagnosis not present

## 2023-05-09 DIAGNOSIS — J8410 Pulmonary fibrosis, unspecified: Secondary | ICD-10-CM | POA: Diagnosis not present

## 2023-05-10 DIAGNOSIS — F330 Major depressive disorder, recurrent, mild: Secondary | ICD-10-CM | POA: Diagnosis not present

## 2023-05-10 DIAGNOSIS — J470 Bronchiectasis with acute lower respiratory infection: Secondary | ICD-10-CM | POA: Diagnosis not present

## 2023-05-10 DIAGNOSIS — I5032 Chronic diastolic (congestive) heart failure: Secondary | ICD-10-CM | POA: Diagnosis not present

## 2023-05-10 DIAGNOSIS — J9621 Acute and chronic respiratory failure with hypoxia: Secondary | ICD-10-CM | POA: Diagnosis not present

## 2023-05-10 DIAGNOSIS — I110 Hypertensive heart disease with heart failure: Secondary | ICD-10-CM | POA: Diagnosis not present

## 2023-05-10 DIAGNOSIS — I700 Atherosclerosis of aorta: Secondary | ICD-10-CM | POA: Diagnosis not present

## 2023-05-10 DIAGNOSIS — J84112 Idiopathic pulmonary fibrosis: Secondary | ICD-10-CM | POA: Diagnosis not present

## 2023-05-10 DIAGNOSIS — J22 Unspecified acute lower respiratory infection: Secondary | ICD-10-CM | POA: Diagnosis not present

## 2023-05-10 DIAGNOSIS — I2721 Secondary pulmonary arterial hypertension: Secondary | ICD-10-CM | POA: Diagnosis not present

## 2023-05-11 DIAGNOSIS — I5032 Chronic diastolic (congestive) heart failure: Secondary | ICD-10-CM | POA: Diagnosis not present

## 2023-05-11 DIAGNOSIS — J470 Bronchiectasis with acute lower respiratory infection: Secondary | ICD-10-CM | POA: Diagnosis not present

## 2023-05-11 DIAGNOSIS — J9621 Acute and chronic respiratory failure with hypoxia: Secondary | ICD-10-CM | POA: Diagnosis not present

## 2023-05-11 DIAGNOSIS — I110 Hypertensive heart disease with heart failure: Secondary | ICD-10-CM | POA: Diagnosis not present

## 2023-05-11 DIAGNOSIS — J84112 Idiopathic pulmonary fibrosis: Secondary | ICD-10-CM | POA: Diagnosis not present

## 2023-05-11 DIAGNOSIS — J22 Unspecified acute lower respiratory infection: Secondary | ICD-10-CM | POA: Diagnosis not present

## 2023-05-11 DIAGNOSIS — I700 Atherosclerosis of aorta: Secondary | ICD-10-CM | POA: Diagnosis not present

## 2023-05-11 DIAGNOSIS — J8410 Pulmonary fibrosis, unspecified: Secondary | ICD-10-CM | POA: Diagnosis not present

## 2023-05-11 DIAGNOSIS — A419 Sepsis, unspecified organism: Secondary | ICD-10-CM | POA: Diagnosis not present

## 2023-05-11 DIAGNOSIS — I2721 Secondary pulmonary arterial hypertension: Secondary | ICD-10-CM | POA: Diagnosis not present

## 2023-05-14 DIAGNOSIS — J9621 Acute and chronic respiratory failure with hypoxia: Secondary | ICD-10-CM | POA: Diagnosis not present

## 2023-05-14 DIAGNOSIS — I271 Kyphoscoliotic heart disease: Secondary | ICD-10-CM | POA: Diagnosis not present

## 2023-05-14 DIAGNOSIS — J84112 Idiopathic pulmonary fibrosis: Secondary | ICD-10-CM | POA: Diagnosis not present

## 2023-05-14 DIAGNOSIS — A419 Sepsis, unspecified organism: Secondary | ICD-10-CM | POA: Diagnosis not present

## 2023-05-14 DIAGNOSIS — I2510 Atherosclerotic heart disease of native coronary artery without angina pectoris: Secondary | ICD-10-CM | POA: Diagnosis not present

## 2023-05-14 DIAGNOSIS — I2721 Secondary pulmonary arterial hypertension: Secondary | ICD-10-CM | POA: Diagnosis not present

## 2023-05-14 DIAGNOSIS — I5032 Chronic diastolic (congestive) heart failure: Secondary | ICD-10-CM | POA: Diagnosis not present

## 2023-05-14 DIAGNOSIS — J849 Interstitial pulmonary disease, unspecified: Secondary | ICD-10-CM | POA: Diagnosis not present

## 2023-05-14 DIAGNOSIS — J22 Unspecified acute lower respiratory infection: Secondary | ICD-10-CM | POA: Diagnosis not present

## 2023-05-14 DIAGNOSIS — I110 Hypertensive heart disease with heart failure: Secondary | ICD-10-CM | POA: Diagnosis not present

## 2023-05-14 DIAGNOSIS — I700 Atherosclerosis of aorta: Secondary | ICD-10-CM | POA: Diagnosis not present

## 2023-05-14 DIAGNOSIS — J470 Bronchiectasis with acute lower respiratory infection: Secondary | ICD-10-CM | POA: Diagnosis not present

## 2023-05-16 DIAGNOSIS — I2721 Secondary pulmonary arterial hypertension: Secondary | ICD-10-CM | POA: Diagnosis not present

## 2023-05-16 DIAGNOSIS — I110 Hypertensive heart disease with heart failure: Secondary | ICD-10-CM | POA: Diagnosis not present

## 2023-05-16 DIAGNOSIS — J9621 Acute and chronic respiratory failure with hypoxia: Secondary | ICD-10-CM | POA: Diagnosis not present

## 2023-05-16 DIAGNOSIS — I700 Atherosclerosis of aorta: Secondary | ICD-10-CM | POA: Diagnosis not present

## 2023-05-16 DIAGNOSIS — J84112 Idiopathic pulmonary fibrosis: Secondary | ICD-10-CM | POA: Diagnosis not present

## 2023-05-16 DIAGNOSIS — A419 Sepsis, unspecified organism: Secondary | ICD-10-CM | POA: Diagnosis not present

## 2023-05-16 DIAGNOSIS — J22 Unspecified acute lower respiratory infection: Secondary | ICD-10-CM | POA: Diagnosis not present

## 2023-05-16 DIAGNOSIS — I5032 Chronic diastolic (congestive) heart failure: Secondary | ICD-10-CM | POA: Diagnosis not present

## 2023-05-16 DIAGNOSIS — J470 Bronchiectasis with acute lower respiratory infection: Secondary | ICD-10-CM | POA: Diagnosis not present

## 2023-05-17 DIAGNOSIS — A419 Sepsis, unspecified organism: Secondary | ICD-10-CM | POA: Diagnosis not present

## 2023-05-17 DIAGNOSIS — I110 Hypertensive heart disease with heart failure: Secondary | ICD-10-CM | POA: Diagnosis not present

## 2023-05-17 DIAGNOSIS — I700 Atherosclerosis of aorta: Secondary | ICD-10-CM | POA: Diagnosis not present

## 2023-05-17 DIAGNOSIS — I2721 Secondary pulmonary arterial hypertension: Secondary | ICD-10-CM | POA: Diagnosis not present

## 2023-05-17 DIAGNOSIS — J22 Unspecified acute lower respiratory infection: Secondary | ICD-10-CM | POA: Diagnosis not present

## 2023-05-17 DIAGNOSIS — J9621 Acute and chronic respiratory failure with hypoxia: Secondary | ICD-10-CM | POA: Diagnosis not present

## 2023-05-17 DIAGNOSIS — J84112 Idiopathic pulmonary fibrosis: Secondary | ICD-10-CM | POA: Diagnosis not present

## 2023-05-17 DIAGNOSIS — J470 Bronchiectasis with acute lower respiratory infection: Secondary | ICD-10-CM | POA: Diagnosis not present

## 2023-05-17 DIAGNOSIS — I5032 Chronic diastolic (congestive) heart failure: Secondary | ICD-10-CM | POA: Diagnosis not present

## 2023-05-18 DIAGNOSIS — I2721 Secondary pulmonary arterial hypertension: Secondary | ICD-10-CM | POA: Diagnosis not present

## 2023-05-18 DIAGNOSIS — J22 Unspecified acute lower respiratory infection: Secondary | ICD-10-CM | POA: Diagnosis not present

## 2023-05-18 DIAGNOSIS — J849 Interstitial pulmonary disease, unspecified: Secondary | ICD-10-CM | POA: Diagnosis not present

## 2023-05-18 DIAGNOSIS — J45909 Unspecified asthma, uncomplicated: Secondary | ICD-10-CM | POA: Diagnosis not present

## 2023-05-18 DIAGNOSIS — I700 Atherosclerosis of aorta: Secondary | ICD-10-CM | POA: Diagnosis not present

## 2023-05-18 DIAGNOSIS — J84112 Idiopathic pulmonary fibrosis: Secondary | ICD-10-CM | POA: Diagnosis not present

## 2023-05-18 DIAGNOSIS — I110 Hypertensive heart disease with heart failure: Secondary | ICD-10-CM | POA: Diagnosis not present

## 2023-05-18 DIAGNOSIS — A419 Sepsis, unspecified organism: Secondary | ICD-10-CM | POA: Diagnosis not present

## 2023-05-18 DIAGNOSIS — I5032 Chronic diastolic (congestive) heart failure: Secondary | ICD-10-CM | POA: Diagnosis not present

## 2023-05-18 DIAGNOSIS — J9621 Acute and chronic respiratory failure with hypoxia: Secondary | ICD-10-CM | POA: Diagnosis not present

## 2023-05-18 DIAGNOSIS — J470 Bronchiectasis with acute lower respiratory infection: Secondary | ICD-10-CM | POA: Diagnosis not present

## 2023-05-21 DIAGNOSIS — I2721 Secondary pulmonary arterial hypertension: Secondary | ICD-10-CM | POA: Diagnosis not present

## 2023-05-21 DIAGNOSIS — J84112 Idiopathic pulmonary fibrosis: Secondary | ICD-10-CM | POA: Diagnosis not present

## 2023-05-21 DIAGNOSIS — I700 Atherosclerosis of aorta: Secondary | ICD-10-CM | POA: Diagnosis not present

## 2023-05-21 DIAGNOSIS — J470 Bronchiectasis with acute lower respiratory infection: Secondary | ICD-10-CM | POA: Diagnosis not present

## 2023-05-21 DIAGNOSIS — I110 Hypertensive heart disease with heart failure: Secondary | ICD-10-CM | POA: Diagnosis not present

## 2023-05-21 DIAGNOSIS — J9621 Acute and chronic respiratory failure with hypoxia: Secondary | ICD-10-CM | POA: Diagnosis not present

## 2023-05-21 DIAGNOSIS — J22 Unspecified acute lower respiratory infection: Secondary | ICD-10-CM | POA: Diagnosis not present

## 2023-05-21 DIAGNOSIS — I5032 Chronic diastolic (congestive) heart failure: Secondary | ICD-10-CM | POA: Diagnosis not present

## 2023-05-21 DIAGNOSIS — A419 Sepsis, unspecified organism: Secondary | ICD-10-CM | POA: Diagnosis not present

## 2023-05-22 DIAGNOSIS — J84112 Idiopathic pulmonary fibrosis: Secondary | ICD-10-CM | POA: Diagnosis not present

## 2023-05-22 DIAGNOSIS — J470 Bronchiectasis with acute lower respiratory infection: Secondary | ICD-10-CM | POA: Diagnosis not present

## 2023-05-22 DIAGNOSIS — I110 Hypertensive heart disease with heart failure: Secondary | ICD-10-CM | POA: Diagnosis not present

## 2023-05-22 DIAGNOSIS — J9621 Acute and chronic respiratory failure with hypoxia: Secondary | ICD-10-CM | POA: Diagnosis not present

## 2023-05-22 DIAGNOSIS — I5032 Chronic diastolic (congestive) heart failure: Secondary | ICD-10-CM | POA: Diagnosis not present

## 2023-05-22 DIAGNOSIS — I2721 Secondary pulmonary arterial hypertension: Secondary | ICD-10-CM | POA: Diagnosis not present

## 2023-05-22 DIAGNOSIS — J22 Unspecified acute lower respiratory infection: Secondary | ICD-10-CM | POA: Diagnosis not present

## 2023-05-22 DIAGNOSIS — A419 Sepsis, unspecified organism: Secondary | ICD-10-CM | POA: Diagnosis not present

## 2023-05-22 DIAGNOSIS — I700 Atherosclerosis of aorta: Secondary | ICD-10-CM | POA: Diagnosis not present

## 2023-05-23 DIAGNOSIS — I5032 Chronic diastolic (congestive) heart failure: Secondary | ICD-10-CM | POA: Diagnosis not present

## 2023-05-23 DIAGNOSIS — J22 Unspecified acute lower respiratory infection: Secondary | ICD-10-CM | POA: Diagnosis not present

## 2023-05-23 DIAGNOSIS — J470 Bronchiectasis with acute lower respiratory infection: Secondary | ICD-10-CM | POA: Diagnosis not present

## 2023-05-23 DIAGNOSIS — I2721 Secondary pulmonary arterial hypertension: Secondary | ICD-10-CM | POA: Diagnosis not present

## 2023-05-23 DIAGNOSIS — I110 Hypertensive heart disease with heart failure: Secondary | ICD-10-CM | POA: Diagnosis not present

## 2023-05-23 DIAGNOSIS — J9621 Acute and chronic respiratory failure with hypoxia: Secondary | ICD-10-CM | POA: Diagnosis not present

## 2023-05-23 DIAGNOSIS — I700 Atherosclerosis of aorta: Secondary | ICD-10-CM | POA: Diagnosis not present

## 2023-05-23 DIAGNOSIS — J84112 Idiopathic pulmonary fibrosis: Secondary | ICD-10-CM | POA: Diagnosis not present

## 2023-05-23 DIAGNOSIS — A419 Sepsis, unspecified organism: Secondary | ICD-10-CM | POA: Diagnosis not present

## 2023-05-24 DIAGNOSIS — I2721 Secondary pulmonary arterial hypertension: Secondary | ICD-10-CM | POA: Diagnosis not present

## 2023-05-24 DIAGNOSIS — J9621 Acute and chronic respiratory failure with hypoxia: Secondary | ICD-10-CM | POA: Diagnosis not present

## 2023-05-24 DIAGNOSIS — I700 Atherosclerosis of aorta: Secondary | ICD-10-CM | POA: Diagnosis not present

## 2023-05-24 DIAGNOSIS — I110 Hypertensive heart disease with heart failure: Secondary | ICD-10-CM | POA: Diagnosis not present

## 2023-05-24 DIAGNOSIS — A419 Sepsis, unspecified organism: Secondary | ICD-10-CM | POA: Diagnosis not present

## 2023-05-24 DIAGNOSIS — J470 Bronchiectasis with acute lower respiratory infection: Secondary | ICD-10-CM | POA: Diagnosis not present

## 2023-05-24 DIAGNOSIS — J84112 Idiopathic pulmonary fibrosis: Secondary | ICD-10-CM | POA: Diagnosis not present

## 2023-05-24 DIAGNOSIS — J22 Unspecified acute lower respiratory infection: Secondary | ICD-10-CM | POA: Diagnosis not present

## 2023-05-24 DIAGNOSIS — I5032 Chronic diastolic (congestive) heart failure: Secondary | ICD-10-CM | POA: Diagnosis not present

## 2023-05-25 DIAGNOSIS — J84112 Idiopathic pulmonary fibrosis: Secondary | ICD-10-CM | POA: Diagnosis not present

## 2023-05-25 DIAGNOSIS — I5032 Chronic diastolic (congestive) heart failure: Secondary | ICD-10-CM | POA: Diagnosis not present

## 2023-05-25 DIAGNOSIS — I700 Atherosclerosis of aorta: Secondary | ICD-10-CM | POA: Diagnosis not present

## 2023-05-25 DIAGNOSIS — I2721 Secondary pulmonary arterial hypertension: Secondary | ICD-10-CM | POA: Diagnosis not present

## 2023-05-25 DIAGNOSIS — J470 Bronchiectasis with acute lower respiratory infection: Secondary | ICD-10-CM | POA: Diagnosis not present

## 2023-05-25 DIAGNOSIS — J22 Unspecified acute lower respiratory infection: Secondary | ICD-10-CM | POA: Diagnosis not present

## 2023-05-25 DIAGNOSIS — I110 Hypertensive heart disease with heart failure: Secondary | ICD-10-CM | POA: Diagnosis not present

## 2023-05-25 DIAGNOSIS — A419 Sepsis, unspecified organism: Secondary | ICD-10-CM | POA: Diagnosis not present

## 2023-05-25 DIAGNOSIS — J9621 Acute and chronic respiratory failure with hypoxia: Secondary | ICD-10-CM | POA: Diagnosis not present

## 2023-05-28 DIAGNOSIS — J849 Interstitial pulmonary disease, unspecified: Secondary | ICD-10-CM | POA: Diagnosis not present

## 2023-05-28 DIAGNOSIS — J45909 Unspecified asthma, uncomplicated: Secondary | ICD-10-CM | POA: Diagnosis not present

## 2023-05-29 DIAGNOSIS — I700 Atherosclerosis of aorta: Secondary | ICD-10-CM | POA: Diagnosis not present

## 2023-05-29 DIAGNOSIS — A419 Sepsis, unspecified organism: Secondary | ICD-10-CM | POA: Diagnosis not present

## 2023-05-29 DIAGNOSIS — I110 Hypertensive heart disease with heart failure: Secondary | ICD-10-CM | POA: Diagnosis not present

## 2023-05-29 DIAGNOSIS — I5032 Chronic diastolic (congestive) heart failure: Secondary | ICD-10-CM | POA: Diagnosis not present

## 2023-05-29 DIAGNOSIS — J84112 Idiopathic pulmonary fibrosis: Secondary | ICD-10-CM | POA: Diagnosis not present

## 2023-05-29 DIAGNOSIS — J9621 Acute and chronic respiratory failure with hypoxia: Secondary | ICD-10-CM | POA: Diagnosis not present

## 2023-05-29 DIAGNOSIS — J470 Bronchiectasis with acute lower respiratory infection: Secondary | ICD-10-CM | POA: Diagnosis not present

## 2023-05-29 DIAGNOSIS — J22 Unspecified acute lower respiratory infection: Secondary | ICD-10-CM | POA: Diagnosis not present

## 2023-05-29 DIAGNOSIS — I2721 Secondary pulmonary arterial hypertension: Secondary | ICD-10-CM | POA: Diagnosis not present

## 2023-05-30 DIAGNOSIS — J22 Unspecified acute lower respiratory infection: Secondary | ICD-10-CM | POA: Diagnosis not present

## 2023-05-30 DIAGNOSIS — J9621 Acute and chronic respiratory failure with hypoxia: Secondary | ICD-10-CM | POA: Diagnosis not present

## 2023-05-30 DIAGNOSIS — A419 Sepsis, unspecified organism: Secondary | ICD-10-CM | POA: Diagnosis not present

## 2023-05-30 DIAGNOSIS — I700 Atherosclerosis of aorta: Secondary | ICD-10-CM | POA: Diagnosis not present

## 2023-05-30 DIAGNOSIS — I110 Hypertensive heart disease with heart failure: Secondary | ICD-10-CM | POA: Diagnosis not present

## 2023-05-30 DIAGNOSIS — J84112 Idiopathic pulmonary fibrosis: Secondary | ICD-10-CM | POA: Diagnosis not present

## 2023-05-30 DIAGNOSIS — J470 Bronchiectasis with acute lower respiratory infection: Secondary | ICD-10-CM | POA: Diagnosis not present

## 2023-05-30 DIAGNOSIS — I5032 Chronic diastolic (congestive) heart failure: Secondary | ICD-10-CM | POA: Diagnosis not present

## 2023-05-30 DIAGNOSIS — I2721 Secondary pulmonary arterial hypertension: Secondary | ICD-10-CM | POA: Diagnosis not present

## 2023-05-31 DIAGNOSIS — A419 Sepsis, unspecified organism: Secondary | ICD-10-CM | POA: Diagnosis not present

## 2023-05-31 DIAGNOSIS — J9621 Acute and chronic respiratory failure with hypoxia: Secondary | ICD-10-CM | POA: Diagnosis not present

## 2023-05-31 DIAGNOSIS — J84112 Idiopathic pulmonary fibrosis: Secondary | ICD-10-CM | POA: Diagnosis not present

## 2023-05-31 DIAGNOSIS — I700 Atherosclerosis of aorta: Secondary | ICD-10-CM | POA: Diagnosis not present

## 2023-05-31 DIAGNOSIS — I5032 Chronic diastolic (congestive) heart failure: Secondary | ICD-10-CM | POA: Diagnosis not present

## 2023-05-31 DIAGNOSIS — J470 Bronchiectasis with acute lower respiratory infection: Secondary | ICD-10-CM | POA: Diagnosis not present

## 2023-05-31 DIAGNOSIS — J22 Unspecified acute lower respiratory infection: Secondary | ICD-10-CM | POA: Diagnosis not present

## 2023-05-31 DIAGNOSIS — I2721 Secondary pulmonary arterial hypertension: Secondary | ICD-10-CM | POA: Diagnosis not present

## 2023-05-31 DIAGNOSIS — I110 Hypertensive heart disease with heart failure: Secondary | ICD-10-CM | POA: Diagnosis not present

## 2023-06-01 DIAGNOSIS — I2721 Secondary pulmonary arterial hypertension: Secondary | ICD-10-CM | POA: Diagnosis not present

## 2023-06-01 DIAGNOSIS — I5032 Chronic diastolic (congestive) heart failure: Secondary | ICD-10-CM | POA: Diagnosis not present

## 2023-06-01 DIAGNOSIS — I700 Atherosclerosis of aorta: Secondary | ICD-10-CM | POA: Diagnosis not present

## 2023-06-01 DIAGNOSIS — J470 Bronchiectasis with acute lower respiratory infection: Secondary | ICD-10-CM | POA: Diagnosis not present

## 2023-06-01 DIAGNOSIS — A419 Sepsis, unspecified organism: Secondary | ICD-10-CM | POA: Diagnosis not present

## 2023-06-01 DIAGNOSIS — J22 Unspecified acute lower respiratory infection: Secondary | ICD-10-CM | POA: Diagnosis not present

## 2023-06-01 DIAGNOSIS — I110 Hypertensive heart disease with heart failure: Secondary | ICD-10-CM | POA: Diagnosis not present

## 2023-06-01 DIAGNOSIS — J84112 Idiopathic pulmonary fibrosis: Secondary | ICD-10-CM | POA: Diagnosis not present

## 2023-06-01 DIAGNOSIS — J9621 Acute and chronic respiratory failure with hypoxia: Secondary | ICD-10-CM | POA: Diagnosis not present

## 2023-06-05 DIAGNOSIS — J9621 Acute and chronic respiratory failure with hypoxia: Secondary | ICD-10-CM | POA: Diagnosis not present

## 2023-06-05 DIAGNOSIS — I2721 Secondary pulmonary arterial hypertension: Secondary | ICD-10-CM | POA: Diagnosis not present

## 2023-06-05 DIAGNOSIS — J470 Bronchiectasis with acute lower respiratory infection: Secondary | ICD-10-CM | POA: Diagnosis not present

## 2023-06-05 DIAGNOSIS — A419 Sepsis, unspecified organism: Secondary | ICD-10-CM | POA: Diagnosis not present

## 2023-06-05 DIAGNOSIS — J22 Unspecified acute lower respiratory infection: Secondary | ICD-10-CM | POA: Diagnosis not present

## 2023-06-05 DIAGNOSIS — I110 Hypertensive heart disease with heart failure: Secondary | ICD-10-CM | POA: Diagnosis not present

## 2023-06-05 DIAGNOSIS — J84112 Idiopathic pulmonary fibrosis: Secondary | ICD-10-CM | POA: Diagnosis not present

## 2023-06-05 DIAGNOSIS — I5032 Chronic diastolic (congestive) heart failure: Secondary | ICD-10-CM | POA: Diagnosis not present

## 2023-06-05 DIAGNOSIS — I700 Atherosclerosis of aorta: Secondary | ICD-10-CM | POA: Diagnosis not present

## 2023-06-07 DIAGNOSIS — A419 Sepsis, unspecified organism: Secondary | ICD-10-CM | POA: Diagnosis not present

## 2023-06-07 DIAGNOSIS — J470 Bronchiectasis with acute lower respiratory infection: Secondary | ICD-10-CM | POA: Diagnosis not present

## 2023-06-07 DIAGNOSIS — J9621 Acute and chronic respiratory failure with hypoxia: Secondary | ICD-10-CM | POA: Diagnosis not present

## 2023-06-07 DIAGNOSIS — I2721 Secondary pulmonary arterial hypertension: Secondary | ICD-10-CM | POA: Diagnosis not present

## 2023-06-07 DIAGNOSIS — J22 Unspecified acute lower respiratory infection: Secondary | ICD-10-CM | POA: Diagnosis not present

## 2023-06-07 DIAGNOSIS — I5032 Chronic diastolic (congestive) heart failure: Secondary | ICD-10-CM | POA: Diagnosis not present

## 2023-06-07 DIAGNOSIS — I110 Hypertensive heart disease with heart failure: Secondary | ICD-10-CM | POA: Diagnosis not present

## 2023-06-07 DIAGNOSIS — I700 Atherosclerosis of aorta: Secondary | ICD-10-CM | POA: Diagnosis not present

## 2023-06-07 DIAGNOSIS — J84112 Idiopathic pulmonary fibrosis: Secondary | ICD-10-CM | POA: Diagnosis not present

## 2023-06-08 DIAGNOSIS — I110 Hypertensive heart disease with heart failure: Secondary | ICD-10-CM | POA: Diagnosis not present

## 2023-06-08 DIAGNOSIS — I2721 Secondary pulmonary arterial hypertension: Secondary | ICD-10-CM | POA: Diagnosis not present

## 2023-06-08 DIAGNOSIS — J22 Unspecified acute lower respiratory infection: Secondary | ICD-10-CM | POA: Diagnosis not present

## 2023-06-08 DIAGNOSIS — J84112 Idiopathic pulmonary fibrosis: Secondary | ICD-10-CM | POA: Diagnosis not present

## 2023-06-08 DIAGNOSIS — A419 Sepsis, unspecified organism: Secondary | ICD-10-CM | POA: Diagnosis not present

## 2023-06-08 DIAGNOSIS — I700 Atherosclerosis of aorta: Secondary | ICD-10-CM | POA: Diagnosis not present

## 2023-06-08 DIAGNOSIS — J849 Interstitial pulmonary disease, unspecified: Secondary | ICD-10-CM | POA: Diagnosis not present

## 2023-06-08 DIAGNOSIS — I5032 Chronic diastolic (congestive) heart failure: Secondary | ICD-10-CM | POA: Diagnosis not present

## 2023-06-08 DIAGNOSIS — J470 Bronchiectasis with acute lower respiratory infection: Secondary | ICD-10-CM | POA: Diagnosis not present

## 2023-06-08 DIAGNOSIS — J8410 Pulmonary fibrosis, unspecified: Secondary | ICD-10-CM | POA: Diagnosis not present

## 2023-06-08 DIAGNOSIS — Z9981 Dependence on supplemental oxygen: Secondary | ICD-10-CM | POA: Diagnosis not present

## 2023-06-08 DIAGNOSIS — J9621 Acute and chronic respiratory failure with hypoxia: Secondary | ICD-10-CM | POA: Diagnosis not present

## 2023-06-17 DIAGNOSIS — J849 Interstitial pulmonary disease, unspecified: Secondary | ICD-10-CM | POA: Diagnosis not present

## 2023-06-17 DIAGNOSIS — J45909 Unspecified asthma, uncomplicated: Secondary | ICD-10-CM | POA: Diagnosis not present

## 2023-06-27 DIAGNOSIS — J45909 Unspecified asthma, uncomplicated: Secondary | ICD-10-CM | POA: Diagnosis not present

## 2023-06-27 DIAGNOSIS — J849 Interstitial pulmonary disease, unspecified: Secondary | ICD-10-CM | POA: Diagnosis not present

## 2023-06-29 DIAGNOSIS — Z9981 Dependence on supplemental oxygen: Secondary | ICD-10-CM | POA: Diagnosis not present

## 2023-06-29 DIAGNOSIS — G8929 Other chronic pain: Secondary | ICD-10-CM | POA: Diagnosis not present

## 2023-06-29 DIAGNOSIS — M549 Dorsalgia, unspecified: Secondary | ICD-10-CM | POA: Diagnosis not present

## 2023-06-29 DIAGNOSIS — J9611 Chronic respiratory failure with hypoxia: Secondary | ICD-10-CM | POA: Diagnosis not present

## 2023-06-29 DIAGNOSIS — M542 Cervicalgia: Secondary | ICD-10-CM | POA: Diagnosis not present

## 2023-06-30 DIAGNOSIS — M549 Dorsalgia, unspecified: Secondary | ICD-10-CM | POA: Diagnosis not present

## 2023-06-30 DIAGNOSIS — J8410 Pulmonary fibrosis, unspecified: Secondary | ICD-10-CM | POA: Diagnosis not present

## 2023-06-30 DIAGNOSIS — I10 Essential (primary) hypertension: Secondary | ICD-10-CM | POA: Diagnosis not present

## 2023-07-01 DIAGNOSIS — J849 Interstitial pulmonary disease, unspecified: Secondary | ICD-10-CM | POA: Diagnosis not present

## 2023-07-01 DIAGNOSIS — M3500 Sicca syndrome, unspecified: Secondary | ICD-10-CM | POA: Diagnosis not present

## 2023-07-01 DIAGNOSIS — I509 Heart failure, unspecified: Secondary | ICD-10-CM | POA: Diagnosis not present

## 2023-07-01 DIAGNOSIS — J209 Acute bronchitis, unspecified: Secondary | ICD-10-CM | POA: Diagnosis not present

## 2023-07-01 DIAGNOSIS — E785 Hyperlipidemia, unspecified: Secondary | ICD-10-CM | POA: Diagnosis not present

## 2023-07-01 DIAGNOSIS — Z7401 Bed confinement status: Secondary | ICD-10-CM | POA: Diagnosis not present

## 2023-07-01 DIAGNOSIS — J9621 Acute and chronic respiratory failure with hypoxia: Secondary | ICD-10-CM | POA: Diagnosis not present

## 2023-07-01 DIAGNOSIS — I517 Cardiomegaly: Secondary | ICD-10-CM | POA: Diagnosis not present

## 2023-07-01 DIAGNOSIS — M25512 Pain in left shoulder: Secondary | ICD-10-CM | POA: Diagnosis not present

## 2023-07-01 DIAGNOSIS — I959 Hypotension, unspecified: Secondary | ICD-10-CM | POA: Diagnosis not present

## 2023-07-01 DIAGNOSIS — I491 Atrial premature depolarization: Secondary | ICD-10-CM | POA: Diagnosis not present

## 2023-07-01 DIAGNOSIS — R0902 Hypoxemia: Secondary | ICD-10-CM | POA: Diagnosis not present

## 2023-07-01 DIAGNOSIS — R079 Chest pain, unspecified: Secondary | ICD-10-CM | POA: Diagnosis not present

## 2023-07-01 DIAGNOSIS — I4892 Unspecified atrial flutter: Secondary | ICD-10-CM | POA: Diagnosis not present

## 2023-07-01 DIAGNOSIS — I700 Atherosclerosis of aorta: Secondary | ICD-10-CM | POA: Diagnosis not present

## 2023-07-01 DIAGNOSIS — I5032 Chronic diastolic (congestive) heart failure: Secondary | ICD-10-CM | POA: Diagnosis not present

## 2023-07-01 DIAGNOSIS — J84112 Idiopathic pulmonary fibrosis: Secondary | ICD-10-CM | POA: Diagnosis not present

## 2023-07-01 DIAGNOSIS — I110 Hypertensive heart disease with heart failure: Secondary | ICD-10-CM | POA: Diagnosis not present

## 2023-07-01 DIAGNOSIS — M542 Cervicalgia: Secondary | ICD-10-CM | POA: Diagnosis not present

## 2023-07-01 DIAGNOSIS — J449 Chronic obstructive pulmonary disease, unspecified: Secondary | ICD-10-CM | POA: Diagnosis not present

## 2023-07-01 DIAGNOSIS — I2510 Atherosclerotic heart disease of native coronary artery without angina pectoris: Secondary | ICD-10-CM | POA: Diagnosis not present

## 2023-07-02 DIAGNOSIS — I5032 Chronic diastolic (congestive) heart failure: Secondary | ICD-10-CM | POA: Diagnosis not present

## 2023-07-02 DIAGNOSIS — J9621 Acute and chronic respiratory failure with hypoxia: Secondary | ICD-10-CM | POA: Diagnosis not present

## 2023-07-02 DIAGNOSIS — J84112 Idiopathic pulmonary fibrosis: Secondary | ICD-10-CM | POA: Diagnosis not present

## 2023-07-03 DIAGNOSIS — J84112 Idiopathic pulmonary fibrosis: Secondary | ICD-10-CM | POA: Diagnosis not present

## 2023-07-03 DIAGNOSIS — I5032 Chronic diastolic (congestive) heart failure: Secondary | ICD-10-CM | POA: Diagnosis not present

## 2023-07-03 DIAGNOSIS — J9621 Acute and chronic respiratory failure with hypoxia: Secondary | ICD-10-CM | POA: Diagnosis not present

## 2023-07-04 ENCOUNTER — Telehealth: Payer: Self-pay | Admitting: Pulmonary Disease

## 2023-07-04 DIAGNOSIS — J9621 Acute and chronic respiratory failure with hypoxia: Secondary | ICD-10-CM | POA: Diagnosis not present

## 2023-07-04 DIAGNOSIS — I5032 Chronic diastolic (congestive) heart failure: Secondary | ICD-10-CM | POA: Diagnosis not present

## 2023-07-04 DIAGNOSIS — J84112 Idiopathic pulmonary fibrosis: Secondary | ICD-10-CM | POA: Diagnosis not present

## 2023-07-04 NOTE — Telephone Encounter (Signed)
Patient's daughter, Efraim Kaufmann, would like a call regarding patient's medication.  Patient is currently in the hospital and hospice is about to be called.  Daughter would like to discuss her medications with the nurse asap.  Please advise and call to discuss further at [phone]

## 2023-07-05 DIAGNOSIS — Z7401 Bed confinement status: Secondary | ICD-10-CM | POA: Diagnosis not present

## 2023-07-05 DIAGNOSIS — I959 Hypotension, unspecified: Secondary | ICD-10-CM | POA: Diagnosis not present

## 2023-07-10 ENCOUNTER — Telehealth: Payer: Self-pay | Admitting: Pulmonary Disease

## 2023-07-10 NOTE — Telephone Encounter (Signed)
Pt. Daughter called pt. Has passed away on Jul 19, 2023

## 2023-07-12 NOTE — Telephone Encounter (Signed)
Called and spoke with Melissa, patients granddaughter. Melissa stated she wanted to update and let Dr. Isaiah Serge know patient is at hospice house in [place].  Nothing further at this time.

## 2023-07-12 DEATH — deceased

## 2023-07-18 ENCOUNTER — Other Ambulatory Visit: Payer: Self-pay | Admitting: Internal Medicine

## 2023-07-18 DIAGNOSIS — J849 Interstitial pulmonary disease, unspecified: Secondary | ICD-10-CM

## 2023-07-26 ENCOUNTER — Other Ambulatory Visit (HOSPITAL_COMMUNITY): Payer: Medicare PPO

## 2023-07-26 ENCOUNTER — Ambulatory Visit (HOSPITAL_COMMUNITY): Payer: Medicare PPO

## 2023-08-02 ENCOUNTER — Ambulatory Visit: Payer: Medicare PPO | Admitting: Pulmonary Disease
